# Patient Record
Sex: Female | Born: 1945
Health system: Southern US, Community
[De-identification: ages and names within clinical notes are randomized; demographics above are authoritative.]

## PROBLEM LIST (undated history)

## (undated) DIAGNOSIS — E785 Hyperlipidemia, unspecified: Secondary | ICD-10-CM

## (undated) DIAGNOSIS — IMO0001 Reserved for inherently not codable concepts without codable children: Secondary | ICD-10-CM

## (undated) DIAGNOSIS — F32A Depression, unspecified: Secondary | ICD-10-CM

## (undated) DIAGNOSIS — J449 Chronic obstructive pulmonary disease, unspecified: Secondary | ICD-10-CM

## (undated) DIAGNOSIS — I10 Essential (primary) hypertension: Secondary | ICD-10-CM

## (undated) DIAGNOSIS — F329 Major depressive disorder, single episode, unspecified: Secondary | ICD-10-CM

## (undated) DIAGNOSIS — M1990 Unspecified osteoarthritis, unspecified site: Secondary | ICD-10-CM

## (undated) DIAGNOSIS — C801 Malignant (primary) neoplasm, unspecified: Secondary | ICD-10-CM

## (undated) DIAGNOSIS — J45909 Unspecified asthma, uncomplicated: Secondary | ICD-10-CM

## (undated) HISTORY — PX: ABDOMINAL HYSTERECTOMY: SHX81

## (undated) HISTORY — DX: Hyperlipidemia, unspecified: E78.5

## (undated) HISTORY — PX: BREAST SURGERY: SHX581

---

## 2001-08-27 DIAGNOSIS — C801 Malignant (primary) neoplasm, unspecified: Secondary | ICD-10-CM

## 2001-08-27 HISTORY — DX: Malignant (primary) neoplasm, unspecified: C80.1

## 2005-10-03 ENCOUNTER — Ambulatory Visit: Payer: Self-pay | Admitting: Physician Assistant

## 2006-07-31 ENCOUNTER — Ambulatory Visit: Payer: Self-pay | Admitting: Gynecologic Oncology

## 2010-10-02 ENCOUNTER — Ambulatory Visit: Payer: Self-pay | Admitting: Gastroenterology

## 2015-09-01 DIAGNOSIS — E559 Vitamin D deficiency, unspecified: Secondary | ICD-10-CM | POA: Diagnosis not present

## 2015-09-01 DIAGNOSIS — Z0001 Encounter for general adult medical examination with abnormal findings: Secondary | ICD-10-CM | POA: Diagnosis not present

## 2015-09-01 DIAGNOSIS — I10 Essential (primary) hypertension: Secondary | ICD-10-CM | POA: Diagnosis not present

## 2015-09-01 DIAGNOSIS — E782 Mixed hyperlipidemia: Secondary | ICD-10-CM | POA: Diagnosis not present

## 2015-09-13 DIAGNOSIS — Z1231 Encounter for screening mammogram for malignant neoplasm of breast: Secondary | ICD-10-CM | POA: Diagnosis not present

## 2015-09-28 DIAGNOSIS — H2512 Age-related nuclear cataract, left eye: Secondary | ICD-10-CM | POA: Diagnosis not present

## 2015-09-30 DIAGNOSIS — H2512 Age-related nuclear cataract, left eye: Secondary | ICD-10-CM | POA: Diagnosis not present

## 2015-10-03 ENCOUNTER — Encounter: Payer: Self-pay | Admitting: *Deleted

## 2015-10-03 MED ORDER — LIDOCAINE HCL (PF) 1 % IJ SOLN
INTRAMUSCULAR | Status: AC
  Filled 2015-10-03: qty 2

## 2015-10-04 ENCOUNTER — Ambulatory Visit: Payer: PPO | Admitting: Anesthesiology

## 2015-10-04 ENCOUNTER — Encounter
Admission: RE | Disposition: A | Discharge status: Home / Self Care | Payer: Self-pay | Source: Ambulatory Visit | Attending: Ophthalmology

## 2015-10-04 ENCOUNTER — Encounter: Payer: Self-pay | Admitting: *Deleted

## 2015-10-04 ENCOUNTER — Ambulatory Visit
Admission: RE | Admit: 2015-10-04 | Discharge: 2015-10-04 | Disposition: A | Discharge status: Home / Self Care | Payer: PPO | Source: Ambulatory Visit | Attending: Ophthalmology | Admitting: Ophthalmology

## 2015-10-04 DIAGNOSIS — Z90710 Acquired absence of both cervix and uterus: Secondary | ICD-10-CM | POA: Diagnosis not present

## 2015-10-04 DIAGNOSIS — H2512 Age-related nuclear cataract, left eye: Secondary | ICD-10-CM | POA: Insufficient documentation

## 2015-10-04 DIAGNOSIS — J449 Chronic obstructive pulmonary disease, unspecified: Secondary | ICD-10-CM | POA: Insufficient documentation

## 2015-10-04 DIAGNOSIS — E7800 Pure hypercholesterolemia, unspecified: Secondary | ICD-10-CM | POA: Diagnosis not present

## 2015-10-04 DIAGNOSIS — Z8542 Personal history of malignant neoplasm of other parts of uterus: Secondary | ICD-10-CM | POA: Diagnosis not present

## 2015-10-04 DIAGNOSIS — F17200 Nicotine dependence, unspecified, uncomplicated: Secondary | ICD-10-CM | POA: Insufficient documentation

## 2015-10-04 DIAGNOSIS — I10 Essential (primary) hypertension: Secondary | ICD-10-CM | POA: Insufficient documentation

## 2015-10-04 DIAGNOSIS — F329 Major depressive disorder, single episode, unspecified: Secondary | ICD-10-CM | POA: Insufficient documentation

## 2015-10-04 DIAGNOSIS — R0602 Shortness of breath: Secondary | ICD-10-CM | POA: Insufficient documentation

## 2015-10-04 DIAGNOSIS — Z886 Allergy status to analgesic agent status: Secondary | ICD-10-CM | POA: Insufficient documentation

## 2015-10-04 HISTORY — DX: Malignant (primary) neoplasm, unspecified: C80.1

## 2015-10-04 HISTORY — DX: Essential (primary) hypertension: I10

## 2015-10-04 HISTORY — DX: Depression, unspecified: F32.A

## 2015-10-04 HISTORY — DX: Major depressive disorder, single episode, unspecified: F32.9

## 2015-10-04 HISTORY — PX: CATARACT EXTRACTION W/PHACO: SHX586

## 2015-10-04 HISTORY — DX: Chronic obstructive pulmonary disease, unspecified: J44.9

## 2015-10-04 HISTORY — DX: Reserved for inherently not codable concepts without codable children: IMO0001

## 2015-10-04 HISTORY — DX: Unspecified asthma, uncomplicated: J45.909

## 2015-10-04 SURGERY — PHACOEMULSIFICATION, CATARACT, WITH IOL INSERTION
Anesthesia: Monitor Anesthesia Care | Site: Eye | Laterality: Left | Wound class: Clean

## 2015-10-04 MED ORDER — MOXIFLOXACIN HCL 0.5 % OP SOLN
OPHTHALMIC | Status: DC | PRN
  Administered 2015-10-04: 4 [drp] via OPHTHALMIC

## 2015-10-04 MED ORDER — POVIDONE-IODINE 5 % OP SOLN
1.0000 "application " | OPHTHALMIC | Status: AC | PRN
  Administered 2015-10-04: 1 via OPHTHALMIC

## 2015-10-04 MED ORDER — ARMC OPHTHALMIC DILATING GEL
1.0000 "application " | OPHTHALMIC | Status: DC | PRN
  Administered 2015-10-04: 1 via OPHTHALMIC

## 2015-10-04 MED ORDER — ARMC OPHTHALMIC DILATING GEL
OPHTHALMIC | Status: AC
  Administered 2015-10-04: 1 via OPHTHALMIC
  Filled 2015-10-04: qty 0.25

## 2015-10-04 MED ORDER — CEFUROXIME OPHTHALMIC INJECTION 1 MG/0.1 ML
INJECTION | OPHTHALMIC | Status: DC | PRN
  Administered 2015-10-04: 1 mg via INTRACAMERAL

## 2015-10-04 MED ORDER — EPINEPHRINE HCL 1 MG/ML IJ SOLN
INTRAOCULAR | Status: DC | PRN
  Administered 2015-10-04: 250 mL via OPHTHALMIC

## 2015-10-04 MED ORDER — NA CHONDROIT SULF-NA HYALURON 40-17 MG/ML IO SOLN
INTRAOCULAR | Status: AC
  Filled 2015-10-04: qty 1

## 2015-10-04 MED ORDER — EPINEPHRINE HCL 1 MG/ML IJ SOLN
INTRAMUSCULAR | Status: AC
  Filled 2015-10-04: qty 1

## 2015-10-04 MED ORDER — TETRACAINE HCL 0.5 % OP SOLN
OPHTHALMIC | Status: AC
  Administered 2015-10-04: 1 [drp] via OPHTHALMIC
  Filled 2015-10-04: qty 2

## 2015-10-04 MED ORDER — MOXIFLOXACIN HCL 0.5 % OP SOLN
OPHTHALMIC | Status: AC
  Filled 2015-10-04: qty 3

## 2015-10-04 MED ORDER — CEFUROXIME OPHTHALMIC INJECTION 1 MG/0.1 ML
INJECTION | OPHTHALMIC | Status: AC
  Filled 2015-10-04: qty 0.1

## 2015-10-04 MED ORDER — CARBACHOL 0.01 % IO SOLN
INTRAOCULAR | Status: DC | PRN
  Administered 2015-10-04: 0.5 mL via INTRAOCULAR

## 2015-10-04 MED ORDER — ALFENTANIL 500 MCG/ML IJ INJ
INJECTION | INTRAMUSCULAR | Status: DC | PRN
  Administered 2015-10-04: 500 ug via INTRAVENOUS

## 2015-10-04 MED ORDER — SODIUM CHLORIDE 0.9 % IV SOLN
INTRAVENOUS | Status: DC
  Administered 2015-10-04 (×2): via INTRAVENOUS

## 2015-10-04 MED ORDER — POVIDONE-IODINE 5 % OP SOLN
OPHTHALMIC | Status: AC
  Administered 2015-10-04: 1 via OPHTHALMIC
  Filled 2015-10-04: qty 30

## 2015-10-04 MED ORDER — NA CHONDROIT SULF-NA HYALURON 40-17 MG/ML IO SOLN
INTRAOCULAR | Status: DC | PRN
  Administered 2015-10-04: 1 mL via INTRAOCULAR

## 2015-10-04 MED ORDER — MOXIFLOXACIN HCL 0.5 % OP SOLN: 1.0000 [drp] | OPHTHALMIC | Status: DC | PRN

## 2015-10-04 MED ORDER — TETRACAINE HCL 0.5 % OP SOLN
1.0000 [drp] | OPHTHALMIC | Status: AC | PRN
  Administered 2015-10-04: 1 [drp] via OPHTHALMIC

## 2015-10-04 SURGICAL SUPPLY — 22 items
CANNULA ANT/CHMB 27GA (MISCELLANEOUS) ×3 IMPLANT
CUP MEDICINE 2OZ PLAST GRAD ST (MISCELLANEOUS) ×3 IMPLANT
GLOVE BIO SURGEON STRL SZ8 (GLOVE) ×3 IMPLANT
GLOVE BIOGEL M 6.5 STRL (GLOVE) ×3 IMPLANT
GLOVE SURG LX 8.0 MICRO (GLOVE) ×2
GLOVE SURG LX STRL 8.0 MICRO (GLOVE) ×1 IMPLANT
GOWN STRL REUS W/ TWL LRG LVL3 (GOWN DISPOSABLE) ×2 IMPLANT
GOWN STRL REUS W/TWL LRG LVL3 (GOWN DISPOSABLE) ×4
LENS IOL TECNIS 23.0 (Intraocular Lens) ×3 IMPLANT
LENS IOL TECNIS MONO 1P 23.0 (Intraocular Lens) ×1 IMPLANT
PACK CATARACT (MISCELLANEOUS) ×3 IMPLANT
PACK CATARACT BRASINGTON LX (MISCELLANEOUS) ×3 IMPLANT
PACK EYE AFTER SURG (MISCELLANEOUS) ×3 IMPLANT
SOL BSS BAG (MISCELLANEOUS) ×3
SOL PREP PVP 2OZ (MISCELLANEOUS) ×3
SOLUTION BSS BAG (MISCELLANEOUS) ×1 IMPLANT
SOLUTION PREP PVP 2OZ (MISCELLANEOUS) ×1 IMPLANT
SYR 3ML LL SCALE MARK (SYRINGE) ×3 IMPLANT
SYR 5ML LL (SYRINGE) ×3 IMPLANT
SYR TB 1ML 27GX1/2 LL (SYRINGE) ×3 IMPLANT
WATER STERILE IRR 1000ML POUR (IV SOLUTION) ×3 IMPLANT
WIPE NON LINTING 3.25X3.25 (MISCELLANEOUS) ×3 IMPLANT

## 2015-10-04 NOTE — Anesthesia Preprocedure Evaluation (Signed)
Anesthesia Evaluation  Patient identified by MRN, date of birth, ID band Patient awake    Reviewed: Allergy & Precautions, H&P , NPO status , Patient's Chart, lab work & pertinent test results, reviewed documented beta blocker date and time   History of Anesthesia Complications Negative for: history of anesthetic complications  Airway Mallampati: I  TM Distance: >3 FB Neck ROM: full    Dental no notable dental hx. (+) Caps   Pulmonary neg shortness of breath, asthma , neg sleep apnea, COPD,  COPD inhaler, neg recent URI, Current Smoker,    Pulmonary exam normal breath sounds clear to auscultation       Cardiovascular Exercise Tolerance: Good hypertension, (-) angina(-) CAD, (-) Past MI, (-) Cardiac Stents and (-) CABG Normal cardiovascular exam(-) dysrhythmias (-) Valvular Problems/Murmurs Rhythm:regular Rate:Normal     Neuro/Psych PSYCHIATRIC DISORDERS (Depression) negative neurological ROS     GI/Hepatic negative GI ROS, Neg liver ROS,   Endo/Other  negative endocrine ROS  Renal/GU negative Renal ROS  negative genitourinary   Musculoskeletal   Abdominal   Peds  Hematology negative hematology ROS (+)   Anesthesia Other Findings Past Medical History:   Asthma                                                       COPD (chronic obstructive pulmonary disease) (*              Shortness of breath dyspnea                                    Comment:DOE   Hypertension                                                 Depression                                                   Cancer (HCC)                                                   Comment:UTERINE   Reproductive/Obstetrics negative OB ROS                             Anesthesia Physical Anesthesia Plan  ASA: III  Anesthesia Plan: MAC   Post-op Pain Management:    Induction:   Airway Management Planned:   Additional Equipment:    Intra-op Plan:   Post-operative Plan:   Informed Consent: I have reviewed the patients History and Physical, chart, labs and discussed the procedure including the risks, benefits and alternatives for the proposed anesthesia with the patient or authorized representative who has indicated his/her understanding and acceptance.   Dental Advisory Given  Plan Discussed with: Anesthesiologist, CRNA and Surgeon  Anesthesia Plan Comments:  Anesthesia Quick Evaluation  

## 2015-10-04 NOTE — Op Note (Signed)
PREOPERATIVE DIAGNOSIS:  Nuclear sclerotic cataract of the left eye.   POSTOPERATIVE DIAGNOSIS:  left nuclear sclerotic CATARACT   OPERATIVE PROCEDURE:  Procedure(s): CATARACT EXTRACTION PHACO AND INTRAOCULAR LENS PLACEMENT (IOC)   SURGEON:  Birder Robson, MD.   ANESTHESIA:   Anesthesiologist: Martha Clan, MD CRNA: Courtney Paris, CRNA  1.      Managed anesthesia care. 2.      Topical tetracaine drops followed by 2% Xylocaine jelly applied in the preoperative holding area.   COMPLICATIONS:  None.   TECHNIQUE:   Stop and chop   DESCRIPTION OF PROCEDURE:  The patient was examined and consented in the preoperative holding area where the aforementioned topical anesthesia was applied to the left eye and then brought back to the Operating Room where the left eye was prepped and draped in the usual sterile ophthalmic fashion and a lid speculum was placed. A paracentesis was created with the side port blade and the anterior chamber was filled with viscoelastic. A near clear corneal incision was performed with the steel keratome. A continuous curvilinear capsulorrhexis was performed with a cystotome followed by the capsulorrhexis forceps. Hydrodissection and hydrodelineation were carried out with BSS on a blunt cannula. The lens was removed in a stop and chop  technique and the remaining cortical material was removed with the irrigation-aspiration handpiece. The capsular bag was inflated with viscoelastic and the Technis ZCB00 lens was placed in the capsular bag without complication. The remaining viscoelastic was removed from the eye with the irrigation-aspiration handpiece. The wounds were hydrated. The anterior chamber was flushed with Miostat and the eye was inflated to physiologic pressure. 0.1 mL of cefuroxime concentration 10 mg/mL was placed in the anterior chamber. The wounds were found to be water tight. The eye was dressed with Vigamox. The patient was given protective glasses to wear  throughout the day and a shield with which to sleep tonight. The patient was also given drops with which to begin a drop regimen today and will follow-up with me in one day.  Implant Name Type Inv. Item Serial No. Manufacturer Lot No. LRB No. Used  LENS IMPL INTRAOC ZCB00 23.0 - [id number] Intraocular Lens LENS IMPL INTRAOC ZCB00 23.0 [id number]   Left 1   Procedure(s) with comments: CATARACT EXTRACTION PHACO AND INTRAOCULAR LENS PLACEMENT (IOC) (Left) - [place]: 00:46.8 AP%: 21.7 CDE: 10.19 Lot # [id number]  Electronically signed: [place] 10/04/2015 11:44 AM

## 2015-10-04 NOTE — H&P (Signed)
All labs reviewed. Abnormal studies sent to patients PCP when indicated.  Previous H&P reviewed, patient examined, there are NO CHANGES.  Wendy Garza LOUIS2/7/201711:17 AM

## 2015-10-04 NOTE — Anesthesia Postprocedure Evaluation (Signed)
Anesthesia Post Note  Patient: Wendy Garza  Procedure(s) Performed: Procedure(s) (LRB): CATARACT EXTRACTION PHACO AND INTRAOCULAR LENS PLACEMENT (IOC) (Left)  Patient location during evaluation: PACU Anesthesia Type: General Level of consciousness: awake and alert Pain management: pain level controlled Vital Signs Assessment: post-procedure vital signs reviewed and stable Respiratory status: spontaneous breathing, nonlabored ventilation, respiratory function stable and patient connected to nasal cannula oxygen Cardiovascular status: blood pressure returned to baseline and stable Postop Assessment: no signs of nausea or vomiting Anesthetic complications: no    Last Vitals:  Filed Vitals:   10/04/15 1147 10/04/15 1203  BP: 125/70 129/86  Pulse: 69 77  Temp: 36.8 C   Resp: 18 16    Last Pain: There were no vitals filed for this visit.               Martha Clan

## 2015-10-04 NOTE — Transfer of Care (Signed)
Immediate Anesthesia Transfer of Care Note  Patient: Wendy Garza  Procedure(s) Performed: Procedure(s) with comments: CATARACT EXTRACTION PHACO AND INTRAOCULAR LENS PLACEMENT (IOC) (Left) - [place]: 00:46.8 AP%: 21.7 CDE: 10.19 Lot # [id number]  Patient Location: PACU and Short Stay  Anesthesia Type:MAC  Level of Consciousness: awake, oriented and patient cooperative  Airway & Oxygen Therapy: Patient Spontanous Breathing  Post-op Assessment: Report given to RN and Post -op Vital signs reviewed and stable  Post vital signs: Reviewed and stable  Last Vitals:  Filed Vitals:   10/04/15 1009  BP: 144/84  Pulse: 78  Temp: 36.6 C  Resp: 16    Complications: No apparent anesthesia complications

## 2015-10-04 NOTE — Discharge Instructions (Signed)
Eye Surgery Discharge Instructions  Expect mild scratchy sensation or mild soreness. DO NOT RUB YOUR EYE!  The day of surgery:  Minimal physical activity, but bed rest is not required  No reading, computer work, or close hand work  No bending, lifting, or straining.  May watch TV  For 24 hours:  No driving, legal decisions, or alcoholic beverages  Safety precautions  Eat anything you prefer: It is better to start with liquids, then soup then solid foods.  _____ Eye patch should be worn until postoperative exam tomorrow.  ____ Solar shield eyeglasses should be worn for comfort in the sunlight/patch while sleeping  Resume all regular medications including aspirin or Coumadin if these were discontinued prior to surgery. You may shower, bathe, shave, or wash your hair. Tylenol may be taken for mild discomfort.  Call your doctor if you experience significant pain, nausea, or vomiting, fever > 101 or other signs of infection. [phone] or [phone] Specific instructions:  Follow-up Information    Follow up with Tim Lair, MD On 10/05/2015.   Specialty:  Ophthalmology   Why:  8:20   Contact information:   [address] [place] [address] [phone]

## 2015-10-19 DIAGNOSIS — R319 Hematuria, unspecified: Secondary | ICD-10-CM | POA: Diagnosis not present

## 2015-10-20 DIAGNOSIS — H2511 Age-related nuclear cataract, right eye: Secondary | ICD-10-CM | POA: Diagnosis not present

## 2015-10-24 ENCOUNTER — Encounter: Payer: Self-pay | Admitting: *Deleted

## 2015-10-25 ENCOUNTER — Ambulatory Visit: Payer: PPO | Admitting: Anesthesiology

## 2015-10-25 ENCOUNTER — Encounter: Payer: Self-pay | Admitting: *Deleted

## 2015-10-25 ENCOUNTER — Ambulatory Visit
Admission: RE | Admit: 2015-10-25 | Discharge: 2015-10-25 | Disposition: A | Discharge status: Home / Self Care | Payer: PPO | Source: Ambulatory Visit | Attending: Ophthalmology | Admitting: Ophthalmology

## 2015-10-25 ENCOUNTER — Encounter
Admission: RE | Disposition: A | Discharge status: Home / Self Care | Payer: Self-pay | Source: Ambulatory Visit | Attending: Ophthalmology

## 2015-10-25 DIAGNOSIS — J449 Chronic obstructive pulmonary disease, unspecified: Secondary | ICD-10-CM | POA: Diagnosis not present

## 2015-10-25 DIAGNOSIS — H2511 Age-related nuclear cataract, right eye: Secondary | ICD-10-CM | POA: Insufficient documentation

## 2015-10-25 DIAGNOSIS — F17200 Nicotine dependence, unspecified, uncomplicated: Secondary | ICD-10-CM | POA: Diagnosis not present

## 2015-10-25 DIAGNOSIS — Z90710 Acquired absence of both cervix and uterus: Secondary | ICD-10-CM | POA: Diagnosis not present

## 2015-10-25 DIAGNOSIS — Z9842 Cataract extraction status, left eye: Secondary | ICD-10-CM | POA: Insufficient documentation

## 2015-10-25 DIAGNOSIS — F329 Major depressive disorder, single episode, unspecified: Secondary | ICD-10-CM | POA: Insufficient documentation

## 2015-10-25 DIAGNOSIS — Z8542 Personal history of malignant neoplasm of other parts of uterus: Secondary | ICD-10-CM | POA: Insufficient documentation

## 2015-10-25 DIAGNOSIS — E7800 Pure hypercholesterolemia, unspecified: Secondary | ICD-10-CM | POA: Insufficient documentation

## 2015-10-25 DIAGNOSIS — R0602 Shortness of breath: Secondary | ICD-10-CM | POA: Insufficient documentation

## 2015-10-25 DIAGNOSIS — I10 Essential (primary) hypertension: Secondary | ICD-10-CM | POA: Insufficient documentation

## 2015-10-25 DIAGNOSIS — Z886 Allergy status to analgesic agent status: Secondary | ICD-10-CM | POA: Insufficient documentation

## 2015-10-25 HISTORY — PX: CATARACT EXTRACTION W/PHACO: SHX586

## 2015-10-25 SURGERY — PHACOEMULSIFICATION, CATARACT, WITH IOL INSERTION
Anesthesia: Monitor Anesthesia Care | Site: Eye | Laterality: Right | Wound class: Clean

## 2015-10-25 MED ORDER — CARBACHOL 0.01 % IO SOLN
INTRAOCULAR | Status: DC | PRN
  Administered 2015-10-25: .5 mL via INTRAOCULAR

## 2015-10-25 MED ORDER — POVIDONE-IODINE 5 % OP SOLN
1.0000 "application " | OPHTHALMIC | Status: AC | PRN
  Administered 2015-10-25: 1 via OPHTHALMIC

## 2015-10-25 MED ORDER — MOXIFLOXACIN HCL 0.5 % OP SOLN: 1.0000 [drp] | OPHTHALMIC | Status: DC | PRN

## 2015-10-25 MED ORDER — CEFUROXIME OPHTHALMIC INJECTION 1 MG/0.1 ML
INJECTION | OPHTHALMIC | Status: AC
  Filled 2015-10-25: qty 0.1

## 2015-10-25 MED ORDER — TETRACAINE HCL 0.5 % OP SOLN
OPHTHALMIC | Status: AC
  Filled 2015-10-25: qty 2

## 2015-10-25 MED ORDER — EPINEPHRINE HCL 1 MG/ML IJ SOLN
INTRAMUSCULAR | Status: AC
  Filled 2015-10-25: qty 1

## 2015-10-25 MED ORDER — ARMC OPHTHALMIC DILATING GEL
OPHTHALMIC | Status: AC
  Filled 2015-10-25: qty 0.25

## 2015-10-25 MED ORDER — FENTANYL CITRATE (PF) 100 MCG/2ML IJ SOLN
INTRAMUSCULAR | Status: DC | PRN
  Administered 2015-10-25: 50 ug via INTRAVENOUS

## 2015-10-25 MED ORDER — TETRACAINE HCL 0.5 % OP SOLN
1.0000 [drp] | OPHTHALMIC | Status: AC | PRN
  Administered 2015-10-25: 1 [drp] via OPHTHALMIC

## 2015-10-25 MED ORDER — MOXIFLOXACIN HCL 0.5 % OP SOLN
OPHTHALMIC | Status: AC
  Filled 2015-10-25: qty 3

## 2015-10-25 MED ORDER — MIDAZOLAM HCL 2 MG/2ML IJ SOLN
INTRAMUSCULAR | Status: DC | PRN
  Administered 2015-10-25: 1 mg via INTRAVENOUS

## 2015-10-25 MED ORDER — ARMC OPHTHALMIC DILATING GEL
1.0000 "application " | OPHTHALMIC | Status: DC | PRN
  Administered 2015-10-25: 1 via OPHTHALMIC

## 2015-10-25 MED ORDER — MOXIFLOXACIN HCL 0.5 % OP SOLN
OPHTHALMIC | Status: DC | PRN
  Administered 2015-10-25: 1 [drp] via OPHTHALMIC

## 2015-10-25 MED ORDER — CEFUROXIME OPHTHALMIC INJECTION 1 MG/0.1 ML
INJECTION | OPHTHALMIC | Status: DC | PRN
  Administered 2015-10-25: .1 mL via INTRACAMERAL

## 2015-10-25 MED ORDER — SODIUM CHLORIDE 0.9 % IV SOLN
INTRAVENOUS | Status: DC
  Administered 2015-10-25: 11:00:00 via INTRAVENOUS

## 2015-10-25 MED ORDER — EPINEPHRINE HCL 1 MG/ML IJ SOLN
INTRAMUSCULAR | Status: DC | PRN
  Administered 2015-10-25: 1 mL via OPHTHALMIC

## 2015-10-25 MED ORDER — NA CHONDROIT SULF-NA HYALURON 40-17 MG/ML IO SOLN
INTRAOCULAR | Status: DC | PRN
  Administered 2015-10-25: 1 mL via INTRAOCULAR

## 2015-10-25 MED ORDER — POVIDONE-IODINE 5 % OP SOLN
OPHTHALMIC | Status: AC
  Filled 2015-10-25: qty 30

## 2015-10-25 SURGICAL SUPPLY — 23 items
CANNULA ANT/CHMB 27GA (MISCELLANEOUS) ×3 IMPLANT
CUP MEDICINE 2OZ PLAST GRAD ST (MISCELLANEOUS) ×3 IMPLANT
GLOVE BIO SURGEON STRL SZ8 (GLOVE) ×3 IMPLANT
GLOVE BIOGEL M 6.5 STRL (GLOVE) ×3 IMPLANT
GLOVE SURG LX 8.0 MICRO (GLOVE) ×2
GLOVE SURG LX STRL 8.0 MICRO (GLOVE) ×1 IMPLANT
GOWN STRL REUS W/ TWL LRG LVL3 (GOWN DISPOSABLE) ×2 IMPLANT
GOWN STRL REUS W/TWL LRG LVL3 (GOWN DISPOSABLE) ×4
LENS IOL TECNIS 21 (Intraocular Lens) ×1 IMPLANT
LENS IOL TECNIS 21.0 (Intraocular Lens) ×2 IMPLANT
LENS IOL TECNIS MONO 1P 21.0 (Intraocular Lens) ×1 IMPLANT
PACK CATARACT (MISCELLANEOUS) ×3 IMPLANT
PACK CATARACT BRASINGTON LX (MISCELLANEOUS) ×3 IMPLANT
PACK EYE AFTER SURG (MISCELLANEOUS) ×3 IMPLANT
SOL BSS BAG (MISCELLANEOUS) ×3
SOL PREP PVP 2OZ (MISCELLANEOUS) ×3
SOLUTION BSS BAG (MISCELLANEOUS) ×1 IMPLANT
SOLUTION PREP PVP 2OZ (MISCELLANEOUS) ×1 IMPLANT
SYR 3ML LL SCALE MARK (SYRINGE) ×3 IMPLANT
SYR 5ML LL (SYRINGE) ×3 IMPLANT
SYR TB 1ML 27GX1/2 LL (SYRINGE) ×3 IMPLANT
WATER STERILE IRR 1000ML POUR (IV SOLUTION) ×3 IMPLANT
WIPE NON LINTING 3.25X3.25 (MISCELLANEOUS) ×3 IMPLANT

## 2015-10-25 NOTE — Anesthesia Postprocedure Evaluation (Signed)
Anesthesia Post Note  Patient: Wendy Garza  Procedure(s) Performed: Procedure(s) (LRB): CATARACT EXTRACTION PHACO AND INTRAOCULAR LENS PLACEMENT (IOC) (Right)  Patient location during evaluation: PACU Anesthesia Type: MAC Level of consciousness: awake and alert Pain management: pain level controlled Vital Signs Assessment: post-procedure vital signs reviewed and stable Respiratory status: spontaneous breathing, nonlabored ventilation, respiratory function stable and patient connected to nasal cannula oxygen Cardiovascular status: stable and blood pressure returned to baseline Anesthetic complications: no    Last Vitals:  Filed Vitals:   10/25/15 1036 10/25/15 1151  BP: 131/104 127/95  Pulse: 78 66  Temp: 37.1 C   Resp: 16 16    Last Pain: There were no vitals filed for this visit.               [employer]

## 2015-10-25 NOTE — Op Note (Signed)
PREOPERATIVE DIAGNOSIS:  Nuclear sclerotic cataract of the right eye.   POSTOPERATIVE DIAGNOSIS: nuclear sclerotic cataract right eye   OPERATIVE PROCEDURE:  Procedure(s): CATARACT EXTRACTION PHACO AND INTRAOCULAR LENS PLACEMENT (IOC)   SURGEON:  Birder Robson, MD.   ANESTHESIA:  Anesthesiologist: Molli Barrows, MD CRNA: Lance Muss, CRNA  1.      Managed anesthesia care. 2.      Topical tetracaine drops followed by 2% Xylocaine jelly applied in the preoperative holding area.   COMPLICATIONS:  None.   TECHNIQUE:   Stop and chop   DESCRIPTION OF PROCEDURE:  The patient was examined and consented in the preoperative holding area where the aforementioned topical anesthesia was applied to the right eye and then brought back to the Operating Room where the right eye was prepped and draped in the usual sterile ophthalmic fashion and a lid speculum was placed. A paracentesis was created with the side port blade and the anterior chamber was filled with viscoelastic. A near clear corneal incision was performed with the steel keratome. A continuous curvilinear capsulorrhexis was performed with a cystotome followed by the capsulorrhexis forceps. Hydrodissection and hydrodelineation were carried out with BSS on a blunt cannula. The lens was removed in a stop and chop  technique and the remaining cortical material was removed with the irrigation-aspiration handpiece. The capsular bag was inflated with viscoelastic and the Technis ZCB00  lens was placed in the capsular bag without complication. The remaining viscoelastic was removed from the eye with the irrigation-aspiration handpiece. The wounds were hydrated. The anterior chamber was flushed with Miostat and the eye was inflated to physiologic pressure. 0.1 mL of cefuroxime concentration 10 mg/mL was placed in the anterior chamber. The wounds were found to be water tight. The eye was dressed with Vigamox. The patient was given protective glasses to wear  throughout the day and a shield with which to sleep tonight. The patient was also given drops with which to begin a drop regimen today and will follow-up with me in one day.  Implant Name Type Inv. Item Serial No. Manufacturer Lot No. LRB No. Used  LENS IOL TECNIS 21.0 - [id number] Intraocular Lens LENS IOL TECNIS 21.0 [id number] Right 1   Procedure(s) with comments: CATARACT EXTRACTION PHACO AND INTRAOCULAR LENS PLACEMENT (IOC) (Right) - [place] 00:48 AP% 25.2 CDE 12.16 fluid pack lot # [id number]  Electronically signed: [place] 10/25/2015 11:53 AM

## 2015-10-25 NOTE — Anesthesia Postprocedure Evaluation (Signed)
Anesthesia Post Note  Patient: Wendy Garza  Procedure(s) Performed: Procedure(s) (LRB): CATARACT EXTRACTION PHACO AND INTRAOCULAR LENS PLACEMENT (IOC) (Right)  Patient location during evaluation: PACU Anesthesia Type: MAC Level of consciousness: awake and alert, awake and oriented Pain management: pain level controlled Vital Signs Assessment: post-procedure vital signs reviewed and stable Respiratory status: spontaneous breathing Cardiovascular status: stable Anesthetic complications: no    Last Vitals:  Filed Vitals:   10/25/15 1036 10/25/15 1151  BP: 131/104   Pulse: 78   Temp: 37.1 C   Resp: 16 16    Last Pain: There were no vitals filed for this visit.               [employer]

## 2015-10-25 NOTE — Discharge Instructions (Signed)
AMBULATORY SURGERY  DISCHARGE INSTRUCTIONS   1) The drugs that you were given will stay in your system until tomorrow so for the next 24 hours you should not:  A) Drive an automobile B) Make any legal decisions C) Drink any alcoholic beverage   2) You may resume regular meals tomorrow.  Today it is better to start with liquids and gradually work up to solid foods.  You may eat anything you prefer, but it is better to start with liquids, then soup and crackers, and gradually work up to solid foods.   3) Please notify your doctor immediately if you have any unusual bleeding, trouble breathing, redness and pain at the surgery site, drainage, fever, or pain not relieved by medication.    4) Additional Instructions:   Eye Surgery Discharge Instructions  Expect mild scratchy sensation or mild soreness. DO NOT RUB YOUR EYE!  The day of surgery:  Minimal physical activity, but bed rest is not required  No reading, computer work, or close hand work  No bending, lifting, or straining.  May watch TV  For 24 hours:  No driving, legal decisions, or alcoholic beverages  Safety precautions  Eat anything you prefer: It is better to start with liquids, then soup then solid foods.  _____ Eye patch should be worn until postoperative exam tomorrow.  ____ Solar shield eyeglasses should be worn for comfort in the sunlight/patch while sleeping  Resume all regular medications including aspirin or Coumadin if these were discontinued prior to surgery. You may shower, bathe, shave, or wash your hair. Tylenol may be taken for mild discomfort.  Call your doctor if you experience significant pain, nausea, or vomiting, fever > 101 or other signs of infection. [phone] or [phone] Specific instructions:  Follow-up Information    Follow up with PORFILIO,WILLIAM LOUIS, MD In 1 day.   Specialty:  Ophthalmology   Why:  March 1 at 9:35am   Contact information:   [place] [address] [phone]          Please contact your physician with any problems or Same Day Surgery at [phone], Monday through Friday 6 am to 4 pm, or [place] at [facility] number at [phone].

## 2015-10-25 NOTE — H&P (Signed)
All labs reviewed. Abnormal studies sent to patients PCP when indicated.  Previous H&P reviewed, patient examined, there are NO CHANGES.  Wendy Ake LOUIS2/28/201711:28 AM

## 2015-10-25 NOTE — Anesthesia Preprocedure Evaluation (Signed)
Anesthesia Evaluation  Patient identified by MRN, date of birth, ID band Patient awake    Reviewed: Allergy & Precautions, H&P , NPO status , Patient's Chart, lab work & pertinent test results, reviewed documented beta blocker date and time   Airway Mallampati: II  TM Distance: >3 FB Neck ROM: full    Dental no notable dental hx. (+) Teeth Intact   Pulmonary neg pulmonary ROS, shortness of breath, asthma , COPD, Current Smoker,    Pulmonary exam normal breath sounds clear to auscultation       Cardiovascular Exercise Tolerance: Good hypertension, negative cardio ROS   Rhythm:regular Rate:Normal     Neuro/Psych PSYCHIATRIC DISORDERS negative neurological ROS  negative psych ROS   GI/Hepatic negative GI ROS, Neg liver ROS,   Endo/Other  negative endocrine ROSdiabetes  Renal/GU      Musculoskeletal   Abdominal   Peds  Hematology negative hematology ROS (+)   Anesthesia Other Findings   Reproductive/Obstetrics negative OB ROS                             Anesthesia Physical Anesthesia Plan  ASA: III  Anesthesia Plan: MAC   Post-op Pain Management:    Induction:   Airway Management Planned:   Additional Equipment:   Intra-op Plan:   Post-operative Plan:   Informed Consent: I have reviewed the patients History and Physical, chart, labs and discussed the procedure including the risks, benefits and alternatives for the proposed anesthesia with the patient or authorized representative who has indicated his/her understanding and acceptance.     Plan Discussed with: CRNA  Anesthesia Plan Comments:         Anesthesia Quick Evaluation

## 2015-10-25 NOTE — Transfer of Care (Signed)
Immediate Anesthesia Transfer of Care Note  Patient: Wendy Garza  Procedure(s) Performed: Procedure(s) with comments: CATARACT EXTRACTION PHACO AND INTRAOCULAR LENS PLACEMENT (IOC) (Right) - [place] 00:48 AP% 25.2 CDE 12.16 fluid pack lot # [id number]  Patient Location: PACU  Anesthesia Type:MAC  Level of Consciousness: awake, alert  and oriented  Airway & Oxygen Therapy: Patient Spontanous Breathing  Post-op Assessment: Report given to RN and Post -op Vital signs reviewed and stable  Post vital signs: Reviewed and stable  Last Vitals:  Filed Vitals:   10/25/15 1036 10/25/15 1151  BP: 131/104   Pulse: 78   Temp: 37.1 C   Resp: 16 16    Complications: No apparent anesthesia complications

## 2015-10-27 DIAGNOSIS — Z8542 Personal history of malignant neoplasm of other parts of uterus: Secondary | ICD-10-CM | POA: Diagnosis not present

## 2015-10-27 DIAGNOSIS — R319 Hematuria, unspecified: Secondary | ICD-10-CM | POA: Diagnosis not present

## 2015-11-10 ENCOUNTER — Ambulatory Visit (INDEPENDENT_AMBULATORY_CARE_PROVIDER_SITE_OTHER): Payer: PPO | Admitting: Urology

## 2015-11-10 ENCOUNTER — Encounter: Payer: Self-pay | Admitting: Urology

## 2015-11-10 VITALS — BP 135/84 | HR 86 | Ht 64.0 in | Wt 164.5 lb

## 2015-11-10 DIAGNOSIS — N3941 Urge incontinence: Secondary | ICD-10-CM | POA: Diagnosis not present

## 2015-11-10 DIAGNOSIS — N393 Stress incontinence (female) (male): Secondary | ICD-10-CM

## 2015-11-10 DIAGNOSIS — R3129 Other microscopic hematuria: Secondary | ICD-10-CM

## 2015-11-10 DIAGNOSIS — R319 Hematuria, unspecified: Secondary | ICD-10-CM | POA: Diagnosis not present

## 2015-11-10 LAB — URINALYSIS, COMPLETE
BILIRUBIN UA: NEGATIVE
Glucose, UA: NEGATIVE
KETONES UA: NEGATIVE
LEUKOCYTES UA: NEGATIVE
NITRITE UA: NEGATIVE
PROTEIN UA: NEGATIVE
SPEC GRAV UA: 1.025 (ref 1.005–1.030)
UUROB: 1 mg/dL (ref 0.2–1.0)
pH, UA: 6 (ref 5.0–7.5)

## 2015-11-10 LAB — MICROSCOPIC EXAMINATION
Bacteria, UA: NONE SEEN
WBC UA: NONE SEEN /HPF (ref 0–?)

## 2015-11-10 MED ORDER — RANITIDINE HCL 150 MG PO TABS: ORAL_TABLET | ORAL | Status: DC

## 2015-11-10 MED ORDER — DIPHENHYDRAMINE HCL 50 MG PO TABS: ORAL_TABLET | ORAL | Status: DC

## 2015-11-10 MED ORDER — PREDNISONE 50 MG PO TABS: ORAL_TABLET | ORAL | Status: DC

## 2015-11-10 NOTE — Progress Notes (Signed)
11/10/2015 8:43 PM   Wendy Garza 1946/04/07 [id number]  Referring provider: No referring provider defined for this encounter.  Chief Complaint  Patient presents with  . Hematuria    new pt    HPI: Patient is a 70 year old Caucasian female with persistent microscopic hematuria who is referred to [place] by her primary care physician, Dr. Humphrey Rolls, for further evaluation and management.    She was found to have 4-10 RBC's/hpf on 08/25/2015 at her PCP's office and 11-30 RBC's/hpf in our office today.  She does not endorse gross hematuria.  She is not experiencing dysuria, suprapubic pain or flank pain.  She does not have a history of recurrent urinary tract infections.  She does not have a personal history of nephrolithiasis or a family history of nephrolithiasis.  She does not have a personal history of GU malignancy or a family history of GU malignancies.  She is a current smoker.  Her baseline urinary symptoms consist of urgency and urge incontinence over the last 2-3 years.  She is wearing a urinary pad daily and can have episodes of urge incontinence that will leak through to her clothing.  She also states that she loses a scant amount of urine when she laughs, coughs or sneezes.  She also is experiencing nocturia 1.    She is not had any recent fevers, chills, nausea or vomiting.  PMH: Past Medical History  Diagnosis Date  . Asthma   . COPD (chronic obstructive pulmonary disease) ([place])   . Shortness of breath dyspnea     DOE  . Hypertension   . Depression   . Cancer (HCC)     UTERINE  . Hyperlipidemia     Surgical History: Past Surgical History  Procedure Laterality Date  . Breast surgery    . Abdominal hysterectomy    . Cataract extraction w/phaco Left 10/04/2015    Procedure: CATARACT EXTRACTION PHACO AND INTRAOCULAR LENS PLACEMENT (IOC);  Surgeon: Birder Robson, MD;  Location: [facility] ORS;  Service: Ophthalmology;  Laterality: Left;  [place]: 00:46.8 AP%: 21.7 CDE:  10.19 Lot # [id number]  . Cataract extraction w/phaco Right 10/25/2015    Procedure: CATARACT EXTRACTION PHACO AND INTRAOCULAR LENS PLACEMENT (IOC);  Surgeon: Birder Robson, MD;  Location: [facility] ORS;  Service: Ophthalmology;  Laterality: Right;  [place] 00:48 AP% 25.2 CDE 12.16 fluid pack lot # [id number]    Home Medications:    Medication List       This list is accurate as of: 11/10/15 11:59 PM.  Always use your most recent med list.               albuterol 108 (90 Base) MCG/ACT inhaler  Commonly known as:  PROVENTIL HFA;VENTOLIN HFA  Inhale into the lungs every 6 (six) hours as needed for wheezing or shortness of breath. Reported on 11/10/2015     budesonide-formoterol 80-4.5 MCG/ACT inhaler  Commonly known as:  SYMBICORT  Inhale into the lungs 2 (two) times daily.     diphenhydrAMINE 50 MG tablet  Commonly known as:  BENADRYL  Take one hour prior to the CT Urogram     fluticasone 50 MCG/ACT nasal spray  Commonly known as:  FLONASE  Place into both nostrils daily. Reported on 11/10/2015     metoprolol 50 MG tablet  Commonly known as:  LOPRESSOR  Take 25 mg by mouth 2 (two) times daily.     predniSONE 50 MG tablet  Commonly known as:  DELTASONE  Take the one tablet  13 hours, 7 hours and 1 hours prior to the CT Urogram     ranitidine 150 MG tablet  Commonly known as:  ZANTAC  Take one tablet 1 hour prior to CT Urogram     rosuvastatin 20 MG tablet  Commonly known as:  CRESTOR  Take 20 mg by mouth daily.     sertraline 50 MG tablet  Commonly known as:  ZOLOFT  Take 50 mg by mouth daily.     Vitamin D (Ergocalciferol) 50000 units Caps capsule  Commonly known as:  DRISDOL  TAKE ONE CAPSULE BY MOUTH ONCE WEEKLY FOR VITAMIN D DEFICIENCY        Allergies:  Allergies  Allergen Reactions  . Aspirin   . Nickel Rash    Family History: Family History  Problem Relation Age of Onset  . Kidney disease Mother   . Kidney cancer Neg Hx   . Prostate cancer Neg Hx      Social History:  reports that she has been smoking.  She does not have any smokeless tobacco history on file. She reports that she does not drink alcohol. Her drug history is not on file.  ROS: UROLOGY Frequent Urination?: No Hard to postpone urination?: Yes Burning/pain with urination?: No Get up at night to urinate?: Yes Leakage of urine?: No Urine stream starts and stops?: No Trouble starting stream?: No Do you have to strain to urinate?: No Blood in urine?: Yes Urinary tract infection?: No Sexually transmitted disease?: No Injury to kidneys or bladder?: No Painful intercourse?: No Weak stream?: No Currently pregnant?: No Vaginal bleeding?: No Last menstrual period?: n  Gastrointestinal Nausea?: No Vomiting?: No Indigestion/heartburn?: No Diarrhea?: No Constipation?: No  Constitutional Fever: No Night sweats?: No Weight loss?: No Fatigue?: No  Skin Skin rash/lesions?: No Itching?: No  Eyes Blurred vision?: No Double vision?: No  Ears/Nose/Throat Sore throat?: No Sinus problems?: No  Hematologic/Lymphatic Swollen glands?: No Easy bruising?: No  Cardiovascular Leg swelling?: No Chest pain?: No  Respiratory Cough?: Yes Shortness of breath?: Yes  Endocrine Excessive thirst?: No  Musculoskeletal Back pain?: No Joint pain?: No  Neurological Headaches?: No Dizziness?: No  Psychologic Depression?: No Anxiety?: No  Physical Exam: BP 135/84 mmHg  Pulse 86  Ht 5\' 4"  (1.626 m)  Wt 164 lb 8 oz (74.617 kg)  BMI 28.22 kg/m2  Constitutional: Well nourished. Alert and oriented, No acute distress. HEENT: [place] AT, moist mucus membranes. Trachea midline, no masses. Cardiovascular: No clubbing, cyanosis, or edema. Respiratory: Normal respiratory effort, no increased work of breathing. GI: Abdomen is soft, non tender, non distended, no abdominal masses.  Stool sample for occult testing is not indicated.   GU: No CVA tenderness.  No bladder  fullness or masses.   Skin: No rashes, bruises or suspicious lesions. Lymph: No cervical or inguinal adenopathy. Neurologic: Grossly intact, no focal deficits, moving all 4 extremities. Psychiatric: Normal mood and affect.  Laboratory Data: Urinalysis Results for orders placed or performed in visit on 11/10/15  CULTURE, URINE COMPREHENSIVE  Result Value Ref Range   Urine Culture, Comprehensive Final report    Result 1 Comment   Microscopic Examination  Result Value Ref Range   WBC, UA None seen 0 -  5 /hpf   RBC, UA 11-30 (A) 0 -  2 /hpf   Epithelial Cells (non renal) 0-10 0 - 10 /hpf   Bacteria, UA None seen None seen/Few  Urinalysis, Complete  Result Value Ref Range   Specific Gravity, UA 1.025 1.005 -  1.030   pH, UA 6.0 5.0 - 7.5   Color, UA Yellow Yellow   Appearance Ur Clear Clear   Leukocytes, UA Negative Negative   Protein, UA Negative Negative/Trace   Glucose, UA Negative Negative   Ketones, UA Negative Negative   RBC, UA 3+ (A) Negative   Bilirubin, UA Negative Negative   Urobilinogen, Ur 1.0 0.2 - 1.0 mg/dL   Nitrite, UA Negative Negative   Microscopic Examination See below:     Assessment & Plan:    1. Microscopic hematuria:   Explained to patient the causes of blood in the urine are as follows: stones, UTI's, damage to the urinary tract and/or cancer.  It is explained to the patient that they will be scheduled for a CT Urogram with contrast material and that in rare instances, an allergic reaction can be serious and even life threatening with the injection of contrast material.   The patient denies any allergies to contrast or iodine.  She is allergic to lobster/crab, but she can eat oysters/clams/shrimp without issue.  She is prescribed an allergy prep. She is not taking metformin.  I have explained to the patient that they will  be scheduled for a cystoscopy in our office to evaluate their bladder.  The cystoscopy consists of passing a tube with a lens up through  their urethra and into their urinary bladder.   We will inject the urethra with a lidocaine gel prior to introducing the cystoscope to help with any discomfort during the procedure.   After the procedure, they might experience blood in the urine and discomfort with urination.  This will abate after the first few voids.  I have  encouraged the patient to increase water intake  during this time.  Patient denies any allergies to lidocaine.   - CULTURE, URINE COMPREHENSIVE - Urinalysis, Complete  2. SUI:   Patient describes a mild stress urinary incontinence.  We will address this once her hematuria workup is completed and no GU pathology is discovered.  3: UI:    Patient describes a moderate urinary incontinence.  We will address this once her hematuria workup is completed and no GU pathology is discovered.  Return for CT Urogram report and cystoscopy.  These notes generated with voice recognition software. I apologize for typographical errors.  Zara Council, [facility] [address] [place], [place] [address] [phone]

## 2015-11-12 ENCOUNTER — Encounter: Payer: Self-pay | Admitting: Urology

## 2015-11-12 DIAGNOSIS — N393 Stress incontinence (female) (male): Secondary | ICD-10-CM | POA: Insufficient documentation

## 2015-11-12 DIAGNOSIS — N3941 Urge incontinence: Secondary | ICD-10-CM | POA: Insufficient documentation

## 2015-11-12 DIAGNOSIS — R3129 Other microscopic hematuria: Secondary | ICD-10-CM | POA: Insufficient documentation

## 2015-11-12 LAB — CULTURE, URINE COMPREHENSIVE

## 2015-11-25 DIAGNOSIS — Z961 Presence of intraocular lens: Secondary | ICD-10-CM | POA: Diagnosis not present

## 2015-11-28 ENCOUNTER — Ambulatory Visit
Admission: RE | Admit: 2015-11-28 | Discharge: 2015-11-28 | Disposition: A | Discharge status: Home / Self Care | Payer: PPO | Source: Ambulatory Visit | Attending: Urology | Admitting: Urology

## 2015-11-28 DIAGNOSIS — R3129 Other microscopic hematuria: Secondary | ICD-10-CM

## 2015-11-28 DIAGNOSIS — Z90710 Acquired absence of both cervix and uterus: Secondary | ICD-10-CM | POA: Insufficient documentation

## 2015-11-28 LAB — POCT I-STAT CREATININE: CREATININE: 0.8 mg/dL (ref 0.44–1.00)

## 2015-11-28 MED ORDER — IOPAMIDOL (ISOVUE-300) INJECTION 61%
125.0000 mL | Freq: Once | INTRAVENOUS | Status: AC | PRN
  Administered 2015-11-28: 125 mL via INTRAVENOUS

## 2015-11-30 ENCOUNTER — Other Ambulatory Visit: Payer: PPO

## 2015-12-02 ENCOUNTER — Ambulatory Visit (INDEPENDENT_AMBULATORY_CARE_PROVIDER_SITE_OTHER): Payer: PPO | Admitting: Urology

## 2015-12-02 ENCOUNTER — Encounter: Payer: Self-pay | Admitting: Urology

## 2015-12-02 VITALS — BP 142/87 | HR 92 | Ht 64.0 in | Wt 164.3 lb

## 2015-12-02 DIAGNOSIS — N393 Stress incontinence (female) (male): Secondary | ICD-10-CM | POA: Diagnosis not present

## 2015-12-02 DIAGNOSIS — N3941 Urge incontinence: Secondary | ICD-10-CM | POA: Diagnosis not present

## 2015-12-02 DIAGNOSIS — R3129 Other microscopic hematuria: Secondary | ICD-10-CM | POA: Diagnosis not present

## 2015-12-02 LAB — MICROSCOPIC EXAMINATION
BACTERIA UA: NONE SEEN
WBC, UA: NONE SEEN /hpf (ref 0–?)

## 2015-12-02 LAB — URINALYSIS, COMPLETE
BILIRUBIN UA: NEGATIVE
Glucose, UA: NEGATIVE
Ketones, UA: NEGATIVE
LEUKOCYTES UA: NEGATIVE
Nitrite, UA: NEGATIVE
PH UA: 7 (ref 5.0–7.5)
PROTEIN UA: NEGATIVE
SPEC GRAV UA: 1.02 (ref 1.005–1.030)
Urobilinogen, Ur: 1 mg/dL (ref 0.2–1.0)

## 2015-12-02 MED ORDER — LIDOCAINE HCL 2 % EX GEL
1.0000 "application " | Freq: Once | CUTANEOUS | Status: AC
  Administered 2015-12-02: 1 via URETHRAL

## 2015-12-02 MED ORDER — CIPROFLOXACIN HCL 500 MG PO TABS
500.0000 mg | ORAL_TABLET | Freq: Once | ORAL | Status: AC
  Administered 2015-12-02: 500 mg via ORAL

## 2015-12-02 NOTE — Progress Notes (Signed)
   12/02/2015  HPI:  70  -year-old female with microscopic hematuria who presents today for office cystoscopy to complete her hematuria workup.  She did undergo further workup with a CT urogram which was negative for any GU pathology. This was reviewed personal today, results were reviewed with the patient.  +smoker  She does have a history of urinary frequency, urgency, and incontinence (mixed).   CT  Urogram 11/28/15 Study Result     CLINICAL DATA: Microscopic hematuria. Urinary urgency. Personal history of uterine carcinoma.  EXAM: CT ABDOMEN AND PELVIS WITHOUT AND WITH CONTRAST  TECHNIQUE: Multidetector CT imaging of the abdomen and pelvis was performed following the standard protocol before and following the bolus administration of intravenous contrast.  CONTRAST: 165mL ISOVUE-300 IOPAMIDOL (ISOVUE-300) INJECTION 61%  COMPARISON: None.  FINDINGS: Lower chest: No acute findings.  Hepatobiliary: No masses or other significant abnormality. Gallbladder is unremarkable.  Pancreas: No mass, inflammatory changes, or other significant abnormality.  Spleen: Within normal limits in size and appearance.  Adrenals/Urinary Tract: No adrenal masses identified. No evidence of urolithiasis or hydronephrosis. No complex cystic or solid renal masses are identified. No masses seen involving the lower urinary tract.  Stomach/Bowel: No evidence of obstruction, inflammatory process, or abnormal fluid collections. Normal appendix visualized.  Vascular/Lymphatic: No pathologically enlarged lymph nodes. No evidence of abdominal aortic aneurysm. Aortic atherosclerotic plaque noted.  Reproductive: Prior hysterectomy noted. Adnexal regions are unremarkable in appearance.  Other: None.  Musculoskeletal: No suspicious bone lesions identified.  IMPRESSION: No radiographic evidence of urinary tract neoplasm, urolithiasis, or hydronephrosis.  Previous  hysterectomy. No evidence of metastatic disease or other acute findings.   Electronically Signed  By: Earle Gell M.D.  On: 11/28/2015 16:48      Cystoscopy Procedure Note  Patient identification was confirmed, informed consent was obtained, and patient was prepped using Betadine solution.  Lidocaine jelly was administered per urethral meatus.    Preoperative abx where received prior to procedure.    Procedure: - Flexible cystoscope introduced, without any difficulty.   - Thorough search of the bladder revealed:    normal urethral meatus    normal urothelium    no stones    no ulcers     no tumors    no urethral polyps    no trabeculation  - Ureteral orifices were normal in position and appearance.  Post-Procedure: - Patient tolerated the procedure well  Assessment/ Plan:  1. Microscopic hematuria S/p negative CTU and cystoscopy Advised smoking cessation - Urinalysis, Complete - ciprofloxacin (CIPRO) tablet 500 mg; Take 1 tablet (500 mg total) by mouth once. - lidocaine (XYLOCAINE) 2 % jelly 1 application; Place 1 application into the urethra once.  2. Urge incontinence  discussed symptoms today. She is not interested in any further medical treatment of further workup.  3. SUI (stress urinary incontinence, female)  as above  F/u on prn basis  Hollice Espy, MD

## 2016-02-07 DIAGNOSIS — F17210 Nicotine dependence, cigarettes, uncomplicated: Secondary | ICD-10-CM | POA: Diagnosis not present

## 2016-02-07 DIAGNOSIS — J449 Chronic obstructive pulmonary disease, unspecified: Secondary | ICD-10-CM | POA: Diagnosis not present

## 2016-02-22 DIAGNOSIS — R0602 Shortness of breath: Secondary | ICD-10-CM | POA: Diagnosis not present

## 2016-02-23 DIAGNOSIS — E559 Vitamin D deficiency, unspecified: Secondary | ICD-10-CM | POA: Diagnosis not present

## 2016-02-23 DIAGNOSIS — E785 Hyperlipidemia, unspecified: Secondary | ICD-10-CM | POA: Diagnosis not present

## 2016-02-23 DIAGNOSIS — I10 Essential (primary) hypertension: Secondary | ICD-10-CM | POA: Diagnosis not present

## 2016-02-23 DIAGNOSIS — F17210 Nicotine dependence, cigarettes, uncomplicated: Secondary | ICD-10-CM | POA: Diagnosis not present

## 2016-03-14 DIAGNOSIS — Z86010 Personal history of colonic polyps: Secondary | ICD-10-CM | POA: Diagnosis not present

## 2016-05-22 DIAGNOSIS — Z23 Encounter for immunization: Secondary | ICD-10-CM | POA: Diagnosis not present

## 2016-05-22 DIAGNOSIS — J449 Chronic obstructive pulmonary disease, unspecified: Secondary | ICD-10-CM | POA: Diagnosis not present

## 2016-05-22 DIAGNOSIS — F17210 Nicotine dependence, cigarettes, uncomplicated: Secondary | ICD-10-CM | POA: Diagnosis not present

## 2016-05-25 ENCOUNTER — Encounter: Payer: Self-pay | Admitting: *Deleted

## 2016-05-28 ENCOUNTER — Ambulatory Visit
Admission: RE | Admit: 2016-05-28 | Discharge: 2016-05-28 | Disposition: A | Discharge status: Home / Self Care | Payer: PPO | Source: Ambulatory Visit | Attending: Unknown Physician Specialty | Admitting: Unknown Physician Specialty

## 2016-05-28 ENCOUNTER — Encounter: Payer: Self-pay | Admitting: Anesthesiology

## 2016-05-28 ENCOUNTER — Ambulatory Visit: Payer: PPO | Admitting: Anesthesiology

## 2016-05-28 ENCOUNTER — Encounter
Admission: RE | Disposition: A | Discharge status: Home / Self Care | Payer: Self-pay | Source: Ambulatory Visit | Attending: Unknown Physician Specialty

## 2016-05-28 DIAGNOSIS — I10 Essential (primary) hypertension: Secondary | ICD-10-CM | POA: Diagnosis not present

## 2016-05-28 DIAGNOSIS — F329 Major depressive disorder, single episode, unspecified: Secondary | ICD-10-CM | POA: Insufficient documentation

## 2016-05-28 DIAGNOSIS — K5730 Diverticulosis of large intestine without perforation or abscess without bleeding: Secondary | ICD-10-CM | POA: Diagnosis not present

## 2016-05-28 DIAGNOSIS — F17210 Nicotine dependence, cigarettes, uncomplicated: Secondary | ICD-10-CM | POA: Diagnosis not present

## 2016-05-28 DIAGNOSIS — E785 Hyperlipidemia, unspecified: Secondary | ICD-10-CM | POA: Insufficient documentation

## 2016-05-28 DIAGNOSIS — K621 Rectal polyp: Secondary | ICD-10-CM | POA: Diagnosis not present

## 2016-05-28 DIAGNOSIS — Z86010 Personal history of colonic polyps: Secondary | ICD-10-CM | POA: Insufficient documentation

## 2016-05-28 DIAGNOSIS — Z1211 Encounter for screening for malignant neoplasm of colon: Secondary | ICD-10-CM | POA: Diagnosis not present

## 2016-05-28 DIAGNOSIS — K648 Other hemorrhoids: Secondary | ICD-10-CM | POA: Diagnosis not present

## 2016-05-28 DIAGNOSIS — D125 Benign neoplasm of sigmoid colon: Secondary | ICD-10-CM | POA: Diagnosis not present

## 2016-05-28 DIAGNOSIS — Z8542 Personal history of malignant neoplasm of other parts of uterus: Secondary | ICD-10-CM | POA: Diagnosis not present

## 2016-05-28 DIAGNOSIS — Z79899 Other long term (current) drug therapy: Secondary | ICD-10-CM | POA: Diagnosis not present

## 2016-05-28 DIAGNOSIS — J449 Chronic obstructive pulmonary disease, unspecified: Secondary | ICD-10-CM | POA: Insufficient documentation

## 2016-05-28 DIAGNOSIS — K640 First degree hemorrhoids: Secondary | ICD-10-CM | POA: Diagnosis not present

## 2016-05-28 DIAGNOSIS — K635 Polyp of colon: Secondary | ICD-10-CM | POA: Diagnosis not present

## 2016-05-28 HISTORY — PX: COLONOSCOPY WITH PROPOFOL: SHX5780

## 2016-05-28 SURGERY — COLONOSCOPY WITH PROPOFOL
Anesthesia: General

## 2016-05-28 MED ORDER — SODIUM CHLORIDE 0.9 % IV SOLN
INTRAVENOUS | Status: DC
  Administered 2016-05-28: 13:00:00 via INTRAVENOUS
  Administered 2016-05-28: 1000 mL via INTRAVENOUS

## 2016-05-28 MED ORDER — METOPROLOL TARTRATE 50 MG PO TABS
50.0000 mg | ORAL_TABLET | Freq: Once | ORAL | Status: AC
  Administered 2016-05-28: 50 mg via ORAL

## 2016-05-28 MED ORDER — PROPOFOL 10 MG/ML IV BOLUS
INTRAVENOUS | Status: DC | PRN
  Administered 2016-05-28: 30 mg via INTRAVENOUS
  Administered 2016-05-28: 20 mg via INTRAVENOUS
  Administered 2016-05-28: 30 mg via INTRAVENOUS

## 2016-05-28 MED ORDER — PROPOFOL 500 MG/50ML IV EMUL
INTRAVENOUS | Status: DC | PRN
  Administered 2016-05-28: 100 ug/kg/min via INTRAVENOUS

## 2016-05-28 MED ORDER — SODIUM CHLORIDE 0.9 % IV SOLN
INTRAVENOUS | Status: DC
Start: 2016-05-28 — End: 2016-05-28

## 2016-05-28 MED ORDER — METOPROLOL TARTRATE 50 MG PO TABS
ORAL_TABLET | ORAL | Status: AC
  Filled 2016-05-28: qty 1

## 2016-05-28 NOTE — Anesthesia Preprocedure Evaluation (Addendum)
Anesthesia Evaluation  Patient identified by MRN, date of birth, ID band Patient awake    Reviewed: Allergy & Precautions, NPO status , Patient's Chart, lab work & pertinent test results, reviewed documented beta blocker date and time   Airway Mallampati: II  TM Distance: >3 FB     Dental  (+) Chipped   Pulmonary shortness of breath, asthma , COPD, Current Smoker,           Cardiovascular hypertension, Pt. on medications and Pt. on home beta blockers      Neuro/Psych PSYCHIATRIC DISORDERS Depression    GI/Hepatic   Endo/Other    Renal/GU      Musculoskeletal   Abdominal   Peds  Hematology   Anesthesia Other Findings Significant overbite.  Reproductive/Obstetrics                            Anesthesia Physical Anesthesia Plan  ASA: III  Anesthesia Plan: General   Post-op Pain Management:    Induction: Intravenous  Airway Management Planned: Nasal Cannula  Additional Equipment:   Intra-op Plan:   Post-operative Plan:   Informed Consent: I have reviewed the patients History and Physical, chart, labs and discussed the procedure including the risks, benefits and alternatives for the proposed anesthesia with the patient or authorized representative who has indicated his/her understanding and acceptance.     Plan Discussed with: CRNA  Anesthesia Plan Comments:         Anesthesia Quick Evaluation

## 2016-05-28 NOTE — Op Note (Signed)
[facility] Patient Name: Wendy Garza Procedure Date: 05/28/2016 12:49 PM MRN: [id number] Account #: [id number] Date of Birth: November 26, 1945 Admit Type: Outpatient Age: 70 Room: [facility] ENDO ROOM 4 Gender: Female Note Status: Finalized Procedure:            Colonoscopy Indications:          High risk colon cancer surveillance: Personal history                        of colonic polyps Providers:            Manya Silvas, MD Referring MD:         Allyne Gee, MD (Referring MD) Medicines:            Propofol per Anesthesia Complications:        No immediate complications. Procedure:            Pre-Anesthesia Assessment:                       - After reviewing the risks and benefits, the patient                        was deemed in satisfactory condition to undergo the                        procedure.                       After obtaining informed consent, the colonoscope was                        passed under direct vision. Throughout the procedure,                        the patient's blood pressure, pulse, and oxygen                        saturations were monitored continuously. The                        Colonoscope was introduced through the anus and                        advanced to the the cecum, identified by appendiceal                        orifice and ileocecal valve. The colonoscopy was                        performed without difficulty. The patient tolerated the                        procedure well. The quality of the bowel preparation                        was excellent. Findings:      Two sessile polyps were found in the rectum and sigmoid colon. The       polyps were diminutive in size. These polyps were removed with a jumbo       cold forceps. Resection and retrieval were complete.  A single small-mouthed diverticulum was found in the sigmoid colon.      Internal hemorrhoids were found during endoscopy. The  hemorrhoids were       small, medium-sized and Grade I (internal hemorrhoids that do not       prolapse).      The exam was otherwise without abnormality. Impression:           - Two diminutive polyps in the rectum and in the                        sigmoid colon, removed with a jumbo cold forceps.                        Resected and retrieved.                       - Diverticulosis in the sigmoid colon.                       - Internal hemorrhoids.                       - The examination was otherwise normal. Recommendation:       - Await pathology results. Manya Silvas, MD 05/28/2016 1:19:59 PM This report has been signed electronically. Number of Addenda: 0 Note Initiated On: 05/28/2016 12:49 PM Scope Withdrawal Time: 0 hours 14 minutes 32 seconds  Total Procedure Duration: 0 hours 22 minutes 46 seconds       [facility]

## 2016-05-28 NOTE — OR Nursing (Signed)
12:24 PM - Lopressor 25 mg po given  (1/2 of 50 mg Tab)...... Not 25 mg as seen on MAR.

## 2016-05-28 NOTE — Transfer of Care (Signed)
Immediate Anesthesia Transfer of Care Note  Patient: Wendy Garza  Procedure(s) Performed: Procedure(s): COLONOSCOPY WITH PROPOFOL (N/A)  Patient Location: PACU and Endoscopy Unit  Anesthesia Type:General  Level of Consciousness: awake  Airway & Oxygen Therapy: Patient Spontanous Breathing  Post-op Assessment: Report given to RN  Post vital signs: stable  Last Vitals:  Vitals:   05/28/16 1215  BP: (!) 129/57  Pulse: 84  Resp: 16  Temp: (!) 36.1 C    Last Pain:  Vitals:   05/28/16 1215  TempSrc: Tympanic         Complications: No apparent anesthesia complications

## 2016-05-28 NOTE — OR Nursing (Signed)
Correction....  12:24 - 25 mg. Tab (1/2 of 50 mg tab) given.  Not 50 mg as seen on MAR.

## 2016-05-28 NOTE — Anesthesia Postprocedure Evaluation (Signed)
Anesthesia Post Note  Patient: Wendy Garza  Procedure(s) Performed: Procedure(s) (LRB): COLONOSCOPY WITH PROPOFOL (N/A)  Patient location during evaluation: Endoscopy Anesthesia Type: General Level of consciousness: awake and alert Pain management: pain level controlled Vital Signs Assessment: post-procedure vital signs reviewed and stable Respiratory status: spontaneous breathing, nonlabored ventilation, respiratory function stable and patient connected to nasal cannula oxygen Cardiovascular status: blood pressure returned to baseline and stable Postop Assessment: no signs of nausea or vomiting Anesthetic complications: no    Last Vitals:  Vitals:   05/28/16 1343 05/28/16 1354  BP: (!) 121/99 119/61  Pulse: 74 69  Resp: (!) 24 15  Temp:      Last Pain:  Vitals:   05/28/16 1324  TempSrc: Tympanic                 [name] S

## 2016-05-28 NOTE — H&P (Signed)
Primary Care Physician:  Pcp Not In System Primary Gastroenterologist:  Dr. Vira Agar  Pre-Procedure History & Physical: HPI:  Wendy Garza is a 70 y.o. female is here for an colonoscopy.   Past Medical History:  Diagnosis Date  . Asthma   . Cancer [facility]) 2003   UTERINE, total Hysterectomy  . COPD (chronic obstructive pulmonary disease) ([place])   . Depression   . Hyperlipidemia   . Hypertension   . Shortness of breath dyspnea    DOE    Past Surgical History:  Procedure Laterality Date  . ABDOMINAL HYSTERECTOMY    . BREAST SURGERY    . CATARACT EXTRACTION W/PHACO Left 10/04/2015   Procedure: CATARACT EXTRACTION PHACO AND INTRAOCULAR LENS PLACEMENT (IOC);  Surgeon: Birder Robson, MD;  Location: [facility] ORS;  Service: Ophthalmology;  Laterality: Left;  [place]: 00:46.8 AP%: 21.7 CDE: 10.19 Lot # [id number]  . CATARACT EXTRACTION W/PHACO Right 10/25/2015   Procedure: CATARACT EXTRACTION PHACO AND INTRAOCULAR LENS PLACEMENT ([place]);  Surgeon: Birder Robson, MD;  Location: [facility] ORS;  Service: Ophthalmology;  Laterality: Right;  [place] 00:48 AP% 25.2 CDE 12.16 fluid pack lot # [id number]    Prior to Admission medications   Medication Sig Start Date End Date Taking? Authorizing Provider  albuterol (PROVENTIL HFA;VENTOLIN HFA) 108 (90 Base) MCG/ACT inhaler Inhale into the lungs every 6 (six) hours as needed for wheezing or shortness of breath. Reported on 11/10/2015   Yes Historical Provider, MD  budesonide-formoterol (SYMBICORT) 80-4.5 MCG/ACT inhaler Inhale into the lungs 2 (two) times daily.   Yes Historical Provider, MD  Calcium Carbonate 500 (200 Ca) MG WAFR Take by mouth daily.   Yes Historical Provider, MD  fluticasone (FLONASE) 50 MCG/ACT nasal spray Place into both nostrils daily. Reported on 11/10/2015   Yes Historical Provider, MD  metoprolol (LOPRESSOR) 50 MG tablet Take 25 mg by mouth 2 (two) times daily.   Yes Historical Provider, MD  rosuvastatin (CRESTOR) 20 MG tablet Take 20 mg by  mouth daily.   Yes Historical Provider, MD  sertraline (ZOLOFT) 50 MG tablet Take 50 mg by mouth daily.   Yes Historical Provider, MD  Vitamin D, Ergocalciferol, (DRISDOL) 50000 units CAPS capsule TAKE ONE CAPSULE BY MOUTH ONCE WEEKLY FOR VITAMIN D DEFICIENCY 10/30/15  Yes Historical Provider, MD    Allergies as of 04/06/2016 - Review Complete 12/02/2015  Allergen Reaction Noted  . Aspirin  10/03/2015  . Nickel Rash 11/10/2015    Family History  Problem Relation Age of Onset  . Kidney disease Mother   . Kidney cancer Neg Hx   . Prostate cancer Neg Hx     Social History   Social History  . Marital status: Single    Spouse name: N/A  . Number of children: N/A  . Years of education: N/A   Occupational History  . Not on file.   Social History Main Topics  . Smoking status: Current Every Day Smoker    Packs/day: 1.00    Years: 25.00    Types: Cigarettes  . Smokeless tobacco: Not on file  . Alcohol use No  . Drug use: Unknown  . Sexual activity: Not on file   Other Topics Concern  . Not on file   Social History Narrative  . No narrative on file    Review of Systems: See HPI, otherwise negative ROS  Physical Exam: BP (!) 129/57   Pulse 84   Temp (!) 96.9 F (36.1 C) (Tympanic)   Resp 16  Ht 5\' 4"  (1.626 m)   Wt 72.6 kg (160 lb)   LMP  (LMP Unknown)   SpO2 99%   BMI 27.46 kg/m  General:   Alert,  pleasant and cooperative in NAD Head:  Normocephalic and atraumatic. Neck:  Supple; no masses or thyromegaly. Lungs:  Clear throughout to auscultation.    Heart:  Regular rate and rhythm. Abdomen:  Soft, nontender and nondistended. Normal bowel sounds, without guarding, and without rebound.   Neurologic:  Alert and  oriented x4;  grossly normal neurologically.  Impression/Plan: Wendy Garza is here for an colonoscopy to be performed for [facility] colon polyps  Risks, benefits, limitations, and alternatives regarding  colonoscopy have been reviewed with the patient.   Questions have been answered.  All parties agreeable.   Gaylyn Cheers, MD  05/28/2016, 12:38 PM

## 2016-05-29 ENCOUNTER — Encounter: Payer: Self-pay | Admitting: Unknown Physician Specialty

## 2016-05-29 LAB — SURGICAL PATHOLOGY

## 2016-06-28 DIAGNOSIS — H26491 Other secondary cataract, right eye: Secondary | ICD-10-CM | POA: Diagnosis not present

## 2016-08-29 DIAGNOSIS — I10 Essential (primary) hypertension: Secondary | ICD-10-CM | POA: Diagnosis not present

## 2016-08-29 DIAGNOSIS — Z0001 Encounter for general adult medical examination with abnormal findings: Secondary | ICD-10-CM | POA: Diagnosis not present

## 2016-08-29 DIAGNOSIS — F17210 Nicotine dependence, cigarettes, uncomplicated: Secondary | ICD-10-CM | POA: Diagnosis not present

## 2016-08-29 DIAGNOSIS — J449 Chronic obstructive pulmonary disease, unspecified: Secondary | ICD-10-CM | POA: Diagnosis not present

## 2016-08-29 DIAGNOSIS — E785 Hyperlipidemia, unspecified: Secondary | ICD-10-CM | POA: Diagnosis not present

## 2016-09-07 DIAGNOSIS — E782 Mixed hyperlipidemia: Secondary | ICD-10-CM | POA: Diagnosis not present

## 2016-09-07 DIAGNOSIS — E559 Vitamin D deficiency, unspecified: Secondary | ICD-10-CM | POA: Diagnosis not present

## 2016-09-07 DIAGNOSIS — Z0001 Encounter for general adult medical examination with abnormal findings: Secondary | ICD-10-CM | POA: Diagnosis not present

## 2016-09-07 DIAGNOSIS — I10 Essential (primary) hypertension: Secondary | ICD-10-CM | POA: Diagnosis not present

## 2016-09-28 DIAGNOSIS — Z1231 Encounter for screening mammogram for malignant neoplasm of breast: Secondary | ICD-10-CM | POA: Diagnosis not present

## 2016-09-28 DIAGNOSIS — M8588 Other specified disorders of bone density and structure, other site: Secondary | ICD-10-CM | POA: Diagnosis not present

## 2016-09-28 DIAGNOSIS — M85852 Other specified disorders of bone density and structure, left thigh: Secondary | ICD-10-CM | POA: Diagnosis not present

## 2016-10-11 ENCOUNTER — Other Ambulatory Visit: Payer: Self-pay

## 2016-11-20 DIAGNOSIS — R0602 Shortness of breath: Secondary | ICD-10-CM | POA: Diagnosis not present

## 2016-11-20 DIAGNOSIS — F17210 Nicotine dependence, cigarettes, uncomplicated: Secondary | ICD-10-CM | POA: Diagnosis not present

## 2016-11-20 DIAGNOSIS — J449 Chronic obstructive pulmonary disease, unspecified: Secondary | ICD-10-CM | POA: Diagnosis not present

## 2017-03-05 DIAGNOSIS — F17210 Nicotine dependence, cigarettes, uncomplicated: Secondary | ICD-10-CM | POA: Diagnosis not present

## 2017-03-05 DIAGNOSIS — I10 Essential (primary) hypertension: Secondary | ICD-10-CM | POA: Diagnosis not present

## 2017-03-05 DIAGNOSIS — F411 Generalized anxiety disorder: Secondary | ICD-10-CM | POA: Diagnosis not present

## 2017-03-05 DIAGNOSIS — E785 Hyperlipidemia, unspecified: Secondary | ICD-10-CM | POA: Diagnosis not present

## 2017-04-12 IMAGING — CT CT ABD-PEL WO/W CM
1 of 4 series · 8 of 32 positions shown, 13 images · IV contrast (APPLIED)
Comparison: None.

CLINICAL DATA: Microscopic hematuria. Urinary urgency. Personal
history of uterine carcinoma.

EXAM:
CT ABDOMEN AND PELVIS WITHOUT AND WITH CONTRAST
TECHNIQUE: Multidetector CT imaging of the abdomen and pelvis was performed
following the standard protocol before and following the bolus
administration of intravenous contrast.
CONTRAST:  125mL Q4LGCM-7TT IOPAMIDOL (Q4LGCM-7TT) INJECTION 61%

[Series 12: axial delay · axial · delayed · 0.69mm/px · z∈[-958,-588]mm · 8 of 96 slices shown, 13 images]
[im 11/96  soft-tissue]
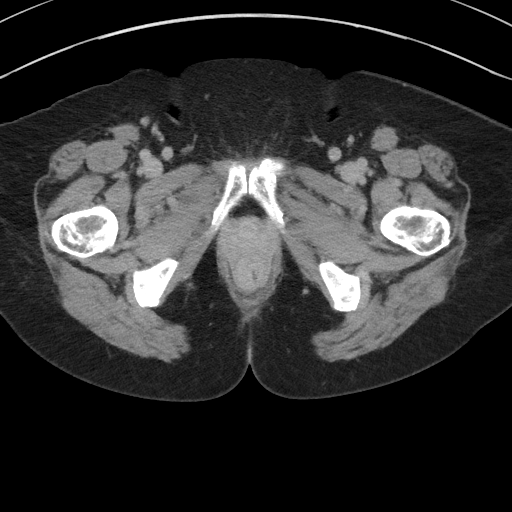
[im 11/96  bone]
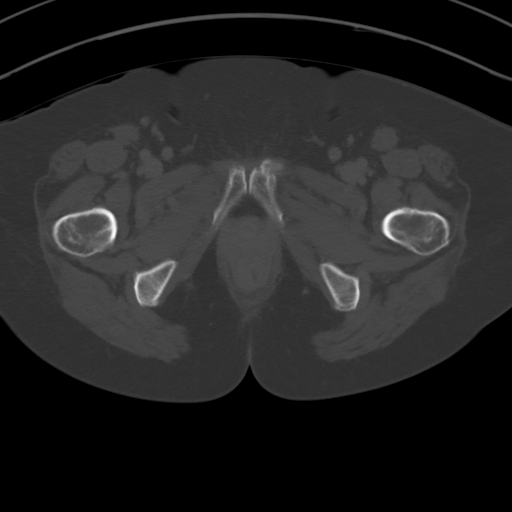
[im 22/96  soft-tissue]
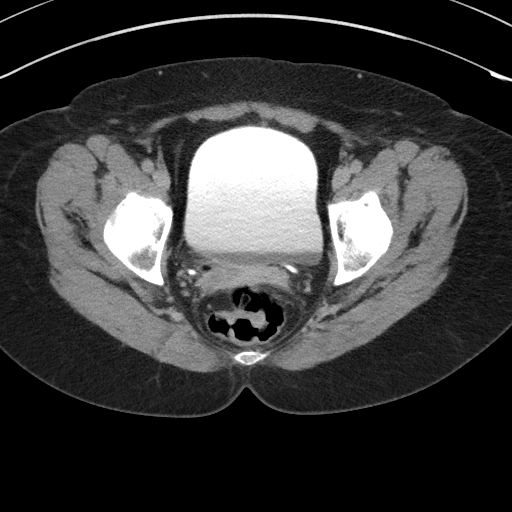
[im 32/96  soft-tissue]
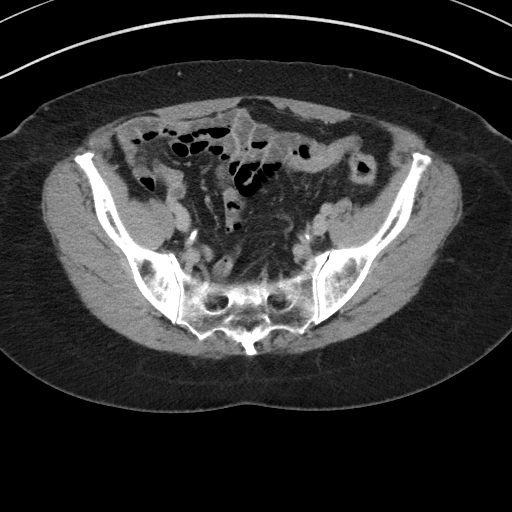
[im 43/96  soft-tissue]
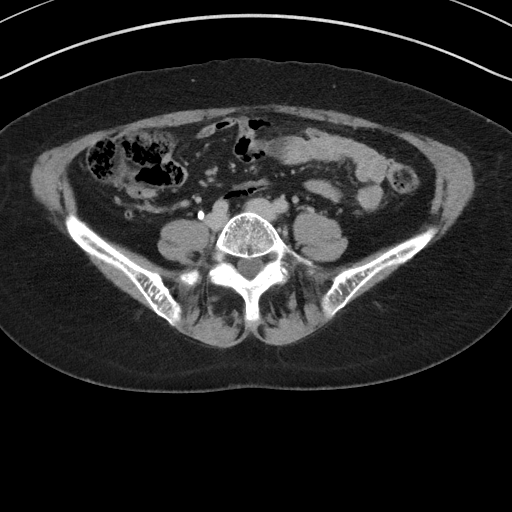
[im 53/96  soft-tissue]
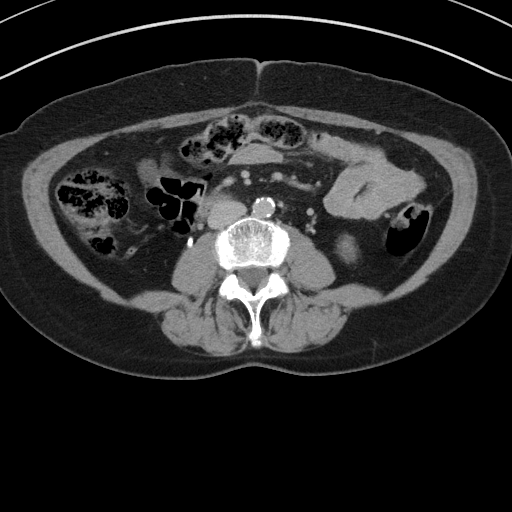
[im 53/96  lung]
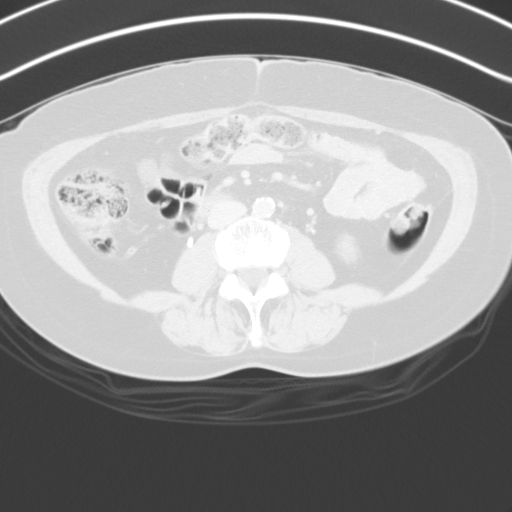
[im 64/96  soft-tissue]
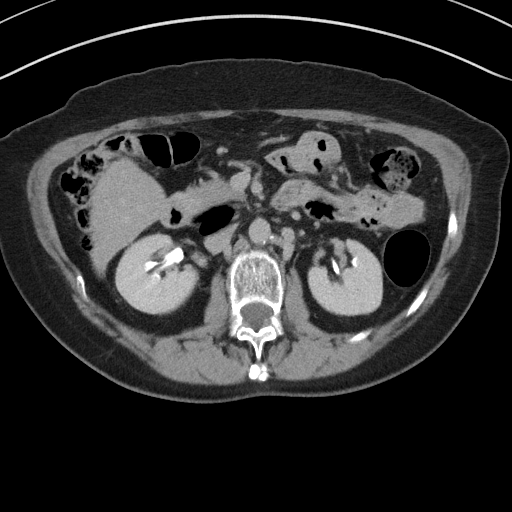
[im 64/96  lung]
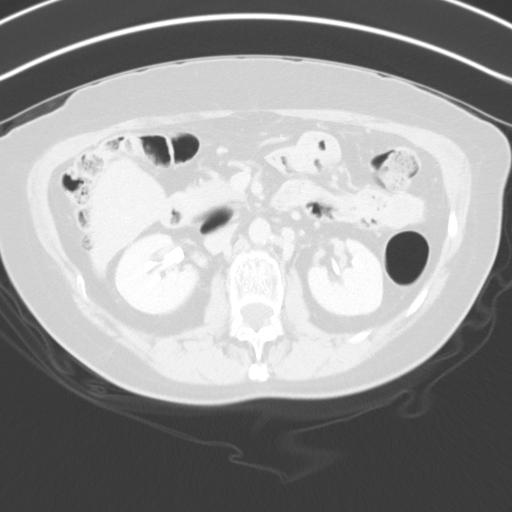
[im 74/96  soft-tissue]
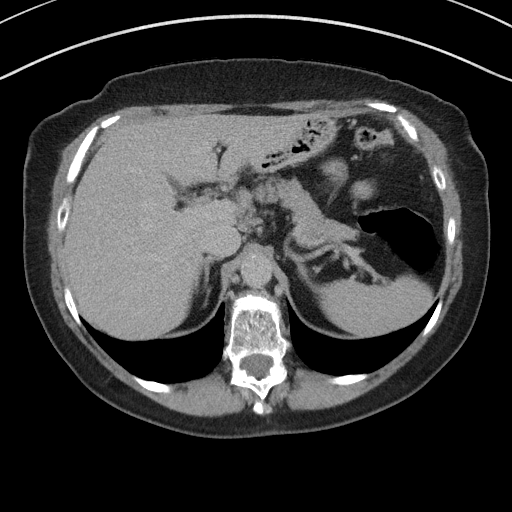
[im 74/96  lung]
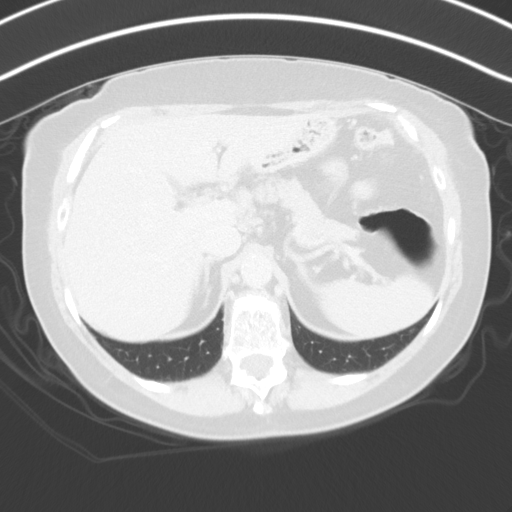
[im 85/96  soft-tissue]
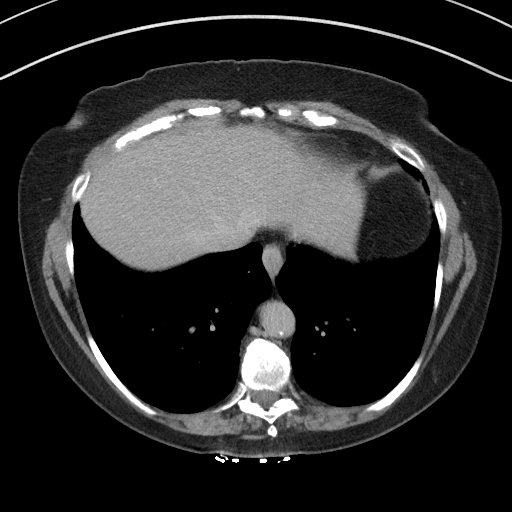
[im 85/96  lung]
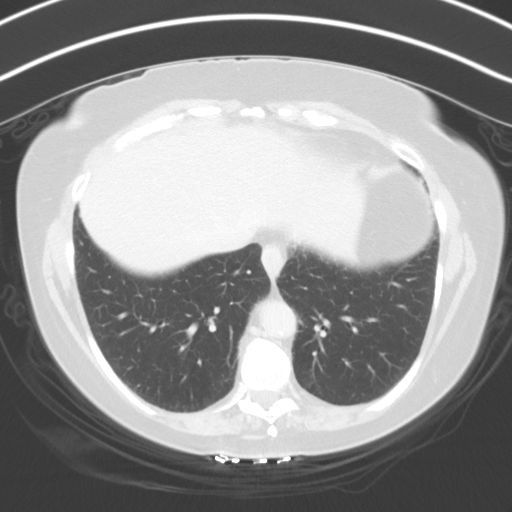

[8 of 32 positions shown; findings below may reference images not displayed]

FINDINGS: Lower chest:  No acute findings.

Hepatobiliary: No masses or other significant abnormality.
Gallbladder is unremarkable.

Pancreas: No mass, inflammatory changes, or other significant
abnormality.

Spleen: Within normal limits in size and appearance.

Adrenals/Urinary Tract: No adrenal masses identified. No evidence of
urolithiasis or hydronephrosis. No complex cystic or solid renal
masses are identified. No masses seen involving the lower urinary
tract.

Stomach/Bowel: No evidence of obstruction, inflammatory process, or
abnormal fluid collections. Normal appendix visualized.

Vascular/Lymphatic: No pathologically enlarged lymph nodes. No
evidence of abdominal aortic aneurysm. Aortic atherosclerotic plaque
noted.

Reproductive: Prior hysterectomy noted. Adnexal regions are
unremarkable in appearance.

Other: None.

Musculoskeletal:  No suspicious bone lesions identified.
IMPRESSION: No radiographic evidence of urinary tract neoplasm, urolithiasis, or
hydronephrosis.

Previous hysterectomy. No evidence of metastatic disease or other
acute findings.

## 2017-09-03 ENCOUNTER — Ambulatory Visit: Payer: Self-pay | Admitting: Nurse Practitioner

## 2017-10-08 ENCOUNTER — Ambulatory Visit (INDEPENDENT_AMBULATORY_CARE_PROVIDER_SITE_OTHER): Payer: PPO | Admitting: Nurse Practitioner

## 2017-10-08 ENCOUNTER — Encounter: Payer: Self-pay | Admitting: Nurse Practitioner

## 2017-10-08 VITALS — BP 137/80 | HR 79 | Resp 16 | Ht 64.0 in | Wt 165.0 lb

## 2017-10-08 DIAGNOSIS — Z1231 Encounter for screening mammogram for malignant neoplasm of breast: Secondary | ICD-10-CM

## 2017-10-08 DIAGNOSIS — Z1239 Encounter for other screening for malignant neoplasm of breast: Secondary | ICD-10-CM

## 2017-10-08 DIAGNOSIS — F411 Generalized anxiety disorder: Secondary | ICD-10-CM

## 2017-10-08 DIAGNOSIS — E782 Mixed hyperlipidemia: Secondary | ICD-10-CM | POA: Diagnosis not present

## 2017-10-08 DIAGNOSIS — E559 Vitamin D deficiency, unspecified: Secondary | ICD-10-CM | POA: Diagnosis not present

## 2017-10-08 DIAGNOSIS — I10 Essential (primary) hypertension: Secondary | ICD-10-CM | POA: Diagnosis not present

## 2017-10-08 DIAGNOSIS — J4520 Mild intermittent asthma, uncomplicated: Secondary | ICD-10-CM | POA: Diagnosis not present

## 2017-10-08 MED ORDER — ALBUTEROL SULFATE HFA 108 (90 BASE) MCG/ACT IN AERS: 2.0000 | INHALATION_SPRAY | Freq: Four times a day (QID) | RESPIRATORY_TRACT | 5 refills | Status: DC | PRN

## 2017-10-08 NOTE — Progress Notes (Signed)
[facility] [place], [place] [address]  Internal MEDICINE  Office Visit Note  Patient Name: Wendy Garza  [id number]  [id number]  Date of Service: 10/08/2017  No chief complaint on file.   Hypertension  This is a chronic problem. The current episode started more than 1 year ago. The problem is unchanged. The problem is controlled. Pertinent negatives include no chest pain, headaches, neck pain, palpitations or shortness of breath. There are no associated agents to hypertension. Risk factors for coronary artery disease include post-menopausal state and dyslipidemia. Past treatments include beta blockers. The current treatment provides moderate improvement. There are no compliance problems.     Pt is here for routine follow up.    Current Medication: Outpatient Encounter Medications as of 10/08/2017  Medication Sig Note  . albuterol (PROVENTIL HFA;VENTOLIN HFA) 108 (90 Base) MCG/ACT inhaler Inhale 2 puffs into the lungs every 6 (six) hours as needed for wheezing or shortness of breath. Reported on 11/10/2015   . budesonide-formoterol (SYMBICORT) 80-4.5 MCG/ACT inhaler Inhale into the lungs 2 (two) times daily.   . Calcium Carbonate 500 (200 Ca) MG WAFR Take by mouth daily.   . metoprolol (LOPRESSOR) 50 MG tablet Take 25 mg by mouth 2 (two) times daily.   . rosuvastatin (CRESTOR) 20 MG tablet Take 20 mg by mouth daily.   . sertraline (ZOLOFT) 50 MG tablet Take 50 mg by mouth daily.   . [DISCONTINUED] albuterol (PROVENTIL HFA;VENTOLIN HFA) 108 (90 Base) MCG/ACT inhaler Inhale into the lungs every 6 (six) hours as needed for wheezing or shortness of breath. Reported on 11/10/2015   . fluticasone (FLONASE) 50 MCG/ACT nasal spray Place into both nostrils daily. Reported on 11/10/2015   . Vitamin D, Ergocalciferol, (DRISDOL) 50000 units CAPS capsule TAKE ONE CAPSULE BY MOUTH ONCE WEEKLY FOR VITAMIN D DEFICIENCY 11/10/2015: Received from: External Pharmacy   No  facility-administered encounter medications on file as of 10/08/2017.     Surgical History: Past Surgical History:  Procedure Laterality Date  . ABDOMINAL HYSTERECTOMY    . BREAST SURGERY    . CATARACT EXTRACTION W/PHACO Left 10/04/2015   Procedure: CATARACT EXTRACTION PHACO AND INTRAOCULAR LENS PLACEMENT (IOC);  Surgeon: Birder Robson, MD;  Location: [facility] ORS;  Service: Ophthalmology;  Laterality: Left;  [place]: 00:46.8 AP%: 21.7 CDE: 10.19 Lot # [id number]  . CATARACT EXTRACTION W/PHACO Right 10/25/2015   Procedure: CATARACT EXTRACTION PHACO AND INTRAOCULAR LENS PLACEMENT ([place]);  Surgeon: Birder Robson, MD;  Location: [facility] ORS;  Service: Ophthalmology;  Laterality: Right;  [place] 00:48 AP% 25.2 CDE 12.16 fluid pack lot # [id number]  . COLONOSCOPY WITH PROPOFOL N/A 05/28/2016   Procedure: COLONOSCOPY WITH PROPOFOL;  Surgeon: Manya Silvas, MD;  Location: [facility] END[facility]COPY;  Service: Endoscopy;  Laterality: N/A;    Medical History: Past Medical History:  Diagnosis Date  . Asthma   . Cancer [facility]) 2003   UTERINE, total Hysterectomy  . COPD (chronic obstructive pulmonary disease) ([place])   . Depression   . Hyperlipidemia   . Hypertension   . Shortness of breath dyspnea    DOE    Family History: Family History  Problem Relation Age of Onset  . Kidney disease Mother   . Kidney cancer Neg Hx   . Prostate cancer Neg Hx     Social History   Socioeconomic History  . Marital status: Single    Spouse name: Not on file  . Number of children: Not on file  . Years of education: Not  on file  . Highest education level: Not on file  Social Needs  . Financial resource strain: Not on file  . Food insecurity - worry: Not on file  . Food insecurity - inability: Not on file  . Transportation needs - medical: Not on file  . Transportation needs - non-medical: Not on file  Occupational History  . Not on file  Tobacco Use  . Smoking status: Current Every Day Smoker    Packs/day: 1.00     Years: 25.00    Pack years: 25.00    Types: Cigarettes  . Smokeless tobacco: Never Used  Substance and Sexual Activity  . Alcohol use: No    Alcohol/week: 0.0 oz  . Drug use: No  . Sexual activity: Not on file  Other Topics Concern  . Not on file  Social History Narrative  . Not on file      Review of Systems  Constitutional: Negative for activity change, chills, fatigue and unexpected weight change.  HENT: Negative for congestion, postnasal drip, rhinorrhea, sneezing and sore throat.   Eyes: Negative.  Negative for redness.  Respiratory: Negative for cough, chest tightness, shortness of breath and wheezing.   Cardiovascular: Negative for chest pain and palpitations.  Gastrointestinal: Negative for abdominal pain, constipation, diarrhea, nausea and vomiting.  Endocrine: Negative for cold intolerance, heat intolerance, polydipsia, polyphagia and polyuria.  Genitourinary: Negative.  Negative for dysuria and frequency.  Musculoskeletal: Negative for arthralgias, back pain, joint swelling and neck pain.  Skin: Negative for rash.  Allergic/Immunologic: Positive for environmental allergies.  Neurological: Negative for tremors, numbness and headaches.  Hematological: Negative for adenopathy. Does not bruise/bleed easily.  Psychiatric/Behavioral: Negative for behavioral problems (Depression), sleep disturbance and suicidal ideas. The patient is not nervous/anxious.     Today's Vitals   10/08/17 1632  BP: 137/80  Pulse: 79  Resp: 16  SpO2: 94%  Weight: 165 lb (74.8 kg)  Height: 5\' 4"  (1.626 m)    Physical Exam  Constitutional: She is oriented to person, place, and time. She appears well-developed and well-nourished. No distress.  HENT:  Head: Normocephalic and atraumatic.  Mouth/Throat: Oropharynx is clear and moist. No oropharyngeal exudate.  Eyes: EOM are normal. Pupils are equal, round, and reactive to light.  Neck: Normal range of motion. Neck supple. No JVD present.  Carotid bruit is not present. No tracheal deviation present. No thyromegaly present.  Cardiovascular: Normal rate, regular rhythm and normal heart sounds. Exam reveals no gallop and no friction rub.  No murmur heard. Pulmonary/Chest: Effort normal and breath sounds normal. No respiratory distress. She has no wheezes. She has no rales. She exhibits no tenderness.  Abdominal: Soft. Bowel sounds are normal. There is no tenderness.  Musculoskeletal: Normal range of motion.  Lymphadenopathy:    She has no cervical adenopathy.  Neurological: She is alert and oriented to person, place, and time. No cranial nerve deficit.  Skin: Skin is warm and dry. She is not diaphoretic.  Psychiatric: She has a normal mood and affect. Her behavior is normal. Judgment and thought content normal.  Nursing note and vitals reviewed.   Assessment/Plan: 1. Mild intermittent asthma without complication Stable.  - albuterol (PROVENTIL HFA;VENTOLIN HFA) 108 (90 Base) MCG/ACT inhaler; Inhale 2 puffs into the lungs every 6 (six) hours as needed for wheezing or shortness of breath. Reported on 11/10/2015  Dispense: 1 Inhaler; Refill: 5  2. Mixed hyperlipidemia Check labs and adjust crestor as indicated  - Comprehensive metabolic panel - Lipid panel  3. Essential hypertension Stable. Continue bp medication as prescribed.  - CBC with Differential/Platelet - Comprehensive metabolic panel - T4, free - TSH  4. Generalized anxiety disorder Stable. Continue sertraline as prescribed   5. Screening for breast cancer - MM DIGITAL SCREENING BILATERAL; Future  6. Vitamin D deficiency - Vitamin D 1,25 dihydroxy  General Counseling: Wendy Garza verbalizes understanding of the findings of todays visit and agrees with plan of treatment. I have discussed any further diagnostic evaluation that may be needed or ordered today. We also reviewed her medications today. she has been encouraged to call the office with any questions or  concerns that should arise related to todays visit.   This patient was seen by Leretha Pol, FNP- C in Collaboration with Dr Lavera Guise as a part of collaborative care agreement    Orders Placed This Encounter  Procedures  . MM DIGITAL SCREENING BILATERAL  . CBC with Differential/Platelet  . Comprehensive metabolic panel  . T4, free  . TSH  . Lipid panel  . Vitamin D 1,25 dihydroxy    Meds ordered this encounter  Medications  . albuterol (PROVENTIL HFA;VENTOLIN HFA) 108 (90 Base) MCG/ACT inhaler    Sig: Inhale 2 puffs into the lungs every 6 (six) hours as needed for wheezing or shortness of breath. Reported on 11/10/2015    Dispense:  1 Inhaler    Refill:  5    Order Specific Question:   Supervising Provider    Answer:   Lavera Guise [[id number]]    Time spent: 41 Minutes          Dr Lavera Guise Internal medicine

## 2017-10-09 ENCOUNTER — Other Ambulatory Visit: Payer: Self-pay | Admitting: Internal Medicine

## 2017-10-16 ENCOUNTER — Ambulatory Visit (INDEPENDENT_AMBULATORY_CARE_PROVIDER_SITE_OTHER): Payer: PPO | Admitting: Internal Medicine

## 2017-10-16 ENCOUNTER — Telehealth: Payer: Self-pay

## 2017-10-16 DIAGNOSIS — R0602 Shortness of breath: Secondary | ICD-10-CM | POA: Diagnosis not present

## 2017-10-16 NOTE — Telephone Encounter (Signed)
-----   Message from Ronnell Freshwater, NP sent at 10/16/2017  2:59 PM EST ----- Please let her know that her labs looked great. thanks

## 2017-10-16 NOTE — Telephone Encounter (Signed)
Left vm that labs were good.  dbs

## 2017-10-17 DIAGNOSIS — J4520 Mild intermittent asthma, uncomplicated: Secondary | ICD-10-CM | POA: Insufficient documentation

## 2017-10-17 DIAGNOSIS — E559 Vitamin D deficiency, unspecified: Secondary | ICD-10-CM | POA: Insufficient documentation

## 2017-10-18 DIAGNOSIS — H43813 Vitreous degeneration, bilateral: Secondary | ICD-10-CM | POA: Diagnosis not present

## 2017-10-21 LAB — COMPREHENSIVE METABOLIC PANEL
A/G RATIO: 1.8 (ref 1.2–2.2)
ALBUMIN: 4.4 g/dL (ref 3.6–4.8)
ALK PHOS: 62 IU/L (ref 39–117)
ALT: 18 IU/L (ref 0–32)
AST: 21 IU/L (ref 0–40)
BILIRUBIN TOTAL: 0.3 mg/dL (ref 0.0–1.2)
BUN / CREAT RATIO: 18 (ref 12–28)
BUN: 16 mg/dL (ref 8–27)
CHLORIDE: 106 mmol/L (ref 96–106)
CO2: 21 mmol/L (ref 20–29)
CREATININE: 0.89 mg/dL (ref 0.57–1.00)
Calcium: 9.4 mg/dL (ref 8.7–10.3)
GFR calc Af Amer: 76 mL/min/{1.73_m2} (ref >59)
GFR calc non Af Amer: 66 mL/min/{1.73_m2} (ref >59)
GLOBULIN, TOTAL: 2.5 g/dL (ref 1.5–4.5)
Glucose: 109 mg/dL — ABNORMAL HIGH (ref 65–99)
Potassium: 4.5 mmol/L (ref 3.5–5.2)
SODIUM: 142 mmol/L (ref 134–144)
Total Protein: 6.9 g/dL (ref 6.0–8.5)

## 2017-10-21 LAB — CBC WITH DIFFERENTIAL/PLATELET
Basophils Absolute: 0 10*3/uL (ref 0.0–0.2)
Basos: 1 %
EOS (ABSOLUTE): 0.2 10*3/uL (ref 0.0–0.4)
EOS: 3 %
HEMATOCRIT: 42.4 % (ref 34.0–46.6)
HEMOGLOBIN: 14.4 g/dL (ref 11.1–15.9)
Immature Grans (Abs): 0 10*3/uL (ref 0.0–0.1)
Immature Granulocytes: 0 %
LYMPHS ABS: 2 10*3/uL (ref 0.7–3.1)
Lymphs: 23 %
MCH: 32.7 pg (ref 26.6–33.0)
MCHC: 34 g/dL (ref 31.5–35.7)
MCV: 96 fL (ref 79–97)
MONOCYTES: 11 %
Monocytes Absolute: 1 10*3/uL — ABNORMAL HIGH (ref 0.1–0.9)
NEUTROS ABS: 5.6 10*3/uL (ref 1.4–7.0)
Neutrophils: 62 %
Platelets: 289 10*3/uL (ref 150–379)
RBC: 4.4 x10E6/uL (ref 3.77–5.28)
RDW: 13.6 % (ref 12.3–15.4)
WBC: 8.9 10*3/uL (ref 3.4–10.8)

## 2017-10-21 LAB — VITAMIN D 1,25 DIHYDROXY
VITAMIN D3 1, 25 (OH): 12 pg/mL
Vitamin D 1, 25 (OH)2 Total: 47 pg/mL
Vitamin D2 1, 25 (OH)2: 35 pg/mL

## 2017-10-21 LAB — LIPID PANEL
CHOL/HDL RATIO: 3.7 ratio (ref 0.0–4.4)
Cholesterol, Total: 108 mg/dL (ref 100–199)
HDL: 29 mg/dL — AB (ref >39)
LDL Calculated: 53 mg/dL (ref 0–99)
Triglycerides: 130 mg/dL (ref 0–149)
VLDL Cholesterol Cal: 26 mg/dL (ref 5–40)

## 2017-10-21 LAB — TSH: TSH: 3.92 u[IU]/mL (ref 0.450–4.500)

## 2017-10-21 LAB — T4, FREE: FREE T4: 0.92 ng/dL (ref 0.82–1.77)

## 2017-10-21 NOTE — Progress Notes (Signed)
Let pt know about her labs.

## 2017-10-31 ENCOUNTER — Encounter: Payer: Self-pay | Admitting: Internal Medicine

## 2017-10-31 NOTE — Progress Notes (Signed)
[facility] [place], [id number]  DATE OF SERVICE:  October 16, 2017  Complete Pulmonary Function Testing Interpretation:  FINDINGS:   forced vital capacity is normal the FEV1 is normal FEV1 FVC ratio is mildly decreased.  Post bronchodilator there is no significant improvement in the FEV 1 however clinically improving may occur in the absence of spirometric improvement.  Total lung capacity is within normal limits by body box residual volume is normal residual volume total lung capacity ratio is normal FRC is increased.  DLCO is severely decreased.  IMPRESSION:    This pulmonary function is within normal limits.  DLCO severely reduced clinical correlation is recommended.  There is no significant change in the FEV1 after bronchodilators were administered  Allyne Gee, MD [facility] Medicine Sleep Medicine

## 2017-10-31 NOTE — Patient Instructions (Signed)

## 2017-12-03 ENCOUNTER — Encounter: Payer: Self-pay | Admitting: Internal Medicine

## 2017-12-03 ENCOUNTER — Ambulatory Visit (INDEPENDENT_AMBULATORY_CARE_PROVIDER_SITE_OTHER): Payer: PPO | Admitting: Internal Medicine

## 2017-12-03 VITALS — BP 122/60 | HR 92 | Resp 16 | Ht 64.0 in | Wt 163.4 lb

## 2017-12-03 DIAGNOSIS — J449 Chronic obstructive pulmonary disease, unspecified: Secondary | ICD-10-CM | POA: Diagnosis not present

## 2017-12-03 DIAGNOSIS — N390 Urinary tract infection, site not specified: Secondary | ICD-10-CM

## 2017-12-03 DIAGNOSIS — F17210 Nicotine dependence, cigarettes, uncomplicated: Secondary | ICD-10-CM | POA: Insufficient documentation

## 2017-12-03 DIAGNOSIS — R300 Dysuria: Secondary | ICD-10-CM | POA: Diagnosis not present

## 2017-12-03 LAB — POCT URINALYSIS DIPSTICK
Bilirubin, UA: NEGATIVE
Glucose, UA: NEGATIVE
KETONES UA: NEGATIVE
LEUKOCYTES UA: NEGATIVE
Nitrite, UA: POSITIVE
PH UA: 5 (ref 5.0–8.0)
PROTEIN UA: NEGATIVE
SPEC GRAV UA: 1.025 (ref 1.010–1.025)
UROBILINOGEN UA: 0.2 U/dL

## 2017-12-03 MED ORDER — SULFAMETHOXAZOLE-TRIMETHOPRIM 400-80 MG PO TABS
1.0000 | ORAL_TABLET | Freq: Two times a day (BID) | ORAL | 0 refills | Status: DC
Start: 2017-12-03 — End: 2018-01-28

## 2017-12-03 NOTE — Patient Instructions (Addendum)
Pneumococcal Conjugate Vaccine suspension for injection What is this medicine? PNEUMOCOCCAL VACCINE (NEU mo KOK al vak SEEN) is a vaccine used to prevent pneumococcus bacterial infections. These bacteria can cause serious infections like pneumonia, meningitis, and blood infections. This vaccine will lower your chance of getting pneumonia. If you do get pneumonia, it can make your symptoms milder and your illness shorter. This vaccine will not treat an infection and will not cause infection. This vaccine is recommended for infants and young children, adults with certain medical conditions, and adults 63 years or older. This medicine may be used for other purposes; ask your health care provider or pharmacist if you have questions. COMMON BRAND NAME(S): Prevnar, Prevnar 13 What should I tell my health care provider before I take this medicine? They need to know if you have any of these conditions: -bleeding problems -fever -immune system problems -an unusual or allergic reaction to pneumococcal vaccine, diphtheria toxoid, other vaccines, latex, other medicines, foods, dyes, or preservatives -pregnant or trying to get pregnant -breast-feeding How should I use this medicine? This vaccine is for injection into a muscle. It is given by a health care professional. A copy of Vaccine Information Statements will be given before each vaccination. Read this sheet carefully each time. The sheet may change frequently. Talk to your pediatrician regarding the use of this medicine in children. While this drug may be prescribed for children as young as 69 weeks old for selected conditions, precautions do apply. Overdosage: If you think you have taken too much of this medicine contact a poison control center or emergency room at once. NOTE: This medicine is only for you. Do not share this medicine with others. What if I miss a dose? It is important not to miss your dose. Call your doctor or health care professional  if you are unable to keep an appointment. What may interact with this medicine? -medicines for cancer chemotherapy -medicines that suppress your immune function -steroid medicines like prednisone or cortisone This list may not describe all possible interactions. Give your health care provider a list of all the medicines, herbs, non-prescription drugs, or dietary supplements you use. Also tell them if you smoke, drink alcohol, or use illegal drugs. Some items may interact with your medicine. What should I watch for while using this medicine? Mild fever and pain should go away in 3 days or less. Report any unusual symptoms to your doctor or health care professional. What side effects may I notice from receiving this medicine? Side effects that you should report to your doctor or health care professional as soon as possible: -allergic reactions like skin rash, itching or hives, swelling of the face, lips, or tongue -breathing problems -confused -fast or irregular heartbeat -fever over 102 degrees F -seizures -unusual bleeding or bruising -unusual muscle weakness Side effects that usually do not require medical attention (report to your doctor or health care professional if they continue or are bothersome): -aches and pains -diarrhea -fever of 102 degrees F or less -headache -irritable -loss of appetite -pain, tender at site where injected -trouble sleeping This list may not describe all possible side effects. Call your doctor for medical advice about side effects. You may report side effects to FDA at 1-800-FDA-1088. Where should I keep my medicine? This does not apply. This vaccine is given in a clinic, pharmacy, doctor's office, or other health care setting and will not be stored at home. NOTE: This sheet is a summary. It may not cover all possible information.  If you have questions about this medicine, talk to your doctor, pharmacist, or health care provider.  2018 Elsevier/Gold  Standard (2014-05-20 10:27:27) Chronic Obstructive Pulmonary Disease Chronic obstructive pulmonary disease (COPD) is a long-term (chronic) lung problem. When you have COPD, it is hard for air to get in and out of your lungs. The way your lungs work will never return to normal. Usually the condition gets worse over time. There are things you can do to keep yourself as healthy as possible. Your doctor may treat your condition with:  Medicines.  Quitting smoking, if you smoke.  Rehabilitation. This may involve a team of specialists.  Oxygen.  Exercise and changes to your diet.  Lung surgery.  Comfort measures (palliative care).  Follow these instructions at home: Medicines  Take over-the-counter and prescription medicines only as told by your doctor.  Talk to your doctor before taking any cough or allergy medicines. You may need to avoid medicines that cause your lungs to be dry. Lifestyle  If you smoke, stop. Smoking makes the problem worse. If you need help quitting, ask your doctor.  Avoid being around things that make your breathing worse. This may include smoke, chemicals, and fumes.  Stay active, but remember to also rest.  Learn and use tips on how to relax.  Make sure you get enough sleep. Most adults need at least 7 hours a night.  Eat healthy foods. Eat smaller meals more often. Rest before meals. Controlled breathing  Learn and use tips on how to control your breathing as told by your doctor. Try: ? Breathing in (inhaling) through your nose for 1 second. Then, pucker your lips and breath out (exhale) through your lips for 2 seconds. ? Putting one hand on your belly (abdomen). Breathe in slowly through your nose for 1 second. Your hand on your belly should move out. Pucker your lips and breathe out slowly through your lips. Your hand on your belly should move in as you breathe out. Controlled coughing  Learn and use controlled coughing to clear mucus from your  lungs. The steps are: 1. Lean your head a little forward. 2. Breathe in deeply. 3. Try to hold your breath for 3 seconds. 4. Keep your mouth slightly open while coughing 2 times. 5. Spit any mucus out into a tissue. 6. Rest and do the steps again 1 or 2 times as needed. General instructions  Make sure you get all the shots (vaccines) that your doctor recommends. Ask your doctor about a flu shot and a pneumonia shot.  Use oxygen therapy and therapy to help improve your lungs (pulmonary rehabilitation) if told by your doctor. If you need home oxygen therapy, ask your doctor if you should buy a tool to measure your oxygen level (oximeter).  Make a COPD action plan with your doctor. This helps you know what to do if you feel worse than usual.  Manage any other conditions you have as told by your doctor.  Avoid going outside when it is very hot, cold, or humid.  Avoid people who have a sickness you can catch (contagious).  Keep all follow-up visits as told by your doctor. This is important. Contact a doctor if:  You cough up more mucus than usual.  There is a change in the color or thickness of the mucus.  It is harder to breathe than usual.  Your breathing is faster than usual.  You have trouble sleeping.  You need to use your medicines more often than  usual.  You have trouble doing your normal activities such as getting dressed or walking around the house. Get help right away if:  You have shortness of breath while resting.  You have shortness of breath that stops you from: ? Being able to talk. ? Doing normal activities.  Your chest hurts for longer than 5 minutes.  Your skin color is more blue than usual.  Your pulse oximeter shows that you have low oxygen for longer than 5 minutes.  You have a fever.  You feel too tired to breathe normally. Summary  Chronic obstructive pulmonary disease (COPD) is a long-term lung problem.  The way your lungs work will never  return to normal. Usually the condition gets worse over time. There are things you can do to keep yourself as healthy as possible.  Take over-the-counter and prescription medicines only as told by your doctor.  If you smoke, stop. Smoking makes the problem worse. This information is not intended to replace advice given to you by your health care provider. Make sure you discuss any questions you have with your health care provider. Document Released: 01/30/2008 Document Revised: 01/19/2016 Document Reviewed: 04/09/2013 Elsevier Interactive Patient Education  2017 [employer].

## 2017-12-03 NOTE — Progress Notes (Signed)
[facility] [place], [place] [address]  Pulmonary Sleep Medicine   Office Visit Note  Patient Name: Wendy Garza DOB: 09/15/1945 MRN [id number]  Date of Service: 12/03/2017  Complaints/HPI:  Patient is here for follow Will of shortness breath COPD.  She states he continues to smoke unfortunately.  Once again discussed with her at length the importance of smoking cessation.  Pulmonary function was done this was reviewed with in detail.  % has no cough congestion noted.  She is complaining about symptoms fine has not noted any frank blood in urine at this time she denies any fevers or chills.  ROS  General: (-) fever, (-) chills, (-) night sweats, (-) weakness Skin: (-) rashes, (-) itching,. Eyes: (-) visual changes, (-) redness, (-) itching. Nose and Sinuses: (-) nasal stuffiness or itchiness, (-) postnasal drip, (-) nosebleeds, (-) sinus trouble. Mouth and Throat: (-) sore throat, (-) hoarseness. Neck: (-) swollen glands, (-) enlarged thyroid, (-) neck pain. Respiratory: - cough, (-) bloody sputum, + shortness of breath, - wheezing. Cardiovascular: - ankle swelling, (-) chest pain. Lymphatic: (-) lymph node enlargement. Neurologic: (-) numbness, (-) tingling. Psychiatric: (-) anxiety, (-) depression   Current Medication: Outpatient Encounter Medications as of 12/03/2017  Medication Sig Note  . albuterol (PROVENTIL HFA;VENTOLIN HFA) 108 (90 Base) MCG/ACT inhaler Inhale 2 puffs into the lungs every 6 (six) hours as needed for wheezing or shortness of breath. Reported on 11/10/2015   . budesonide-formoterol (SYMBICORT) 80-4.5 MCG/ACT inhaler Inhale into the lungs 2 (two) times daily.   . Calcium Carbonate 500 (200 Ca) MG WAFR Take by mouth daily.   . fluticasone (FLONASE) 50 MCG/ACT nasal spray Place into both nostrils daily. Reported on 11/10/2015   . metoprolol (LOPRESSOR) 50 MG tablet Take 25 mg by mouth 2 (two) times daily.   . rosuvastatin (CRESTOR) 20  MG tablet Take 20 mg by mouth daily.   . sertraline (ZOLOFT) 50 MG tablet Take 50 mg by mouth daily.   . Vitamin D, Ergocalciferol, (DRISDOL) 50000 units CAPS capsule TAKE ONE CAPSULE BY MOUTH ONCE WEEKLY FOR VITAMIN D DEFICIENCY 11/10/2015: Received from: External Pharmacy   No facility-administered encounter medications on file as of 12/03/2017.     Surgical History: Past Surgical History:  Procedure Laterality Date  . ABDOMINAL HYSTERECTOMY    . BREAST SURGERY    . CATARACT EXTRACTION W/PHACO Left 10/04/2015   Procedure: CATARACT EXTRACTION PHACO AND INTRAOCULAR LENS PLACEMENT (IOC);  Surgeon: Birder Robson, MD;  Location: [facility] ORS;  Service: Ophthalmology;  Laterality: Left;  [place]: 00:46.8 AP%: 21.7 CDE: 10.19 Lot # [id number]  . CATARACT EXTRACTION W/PHACO Right 10/25/2015   Procedure: CATARACT EXTRACTION PHACO AND INTRAOCULAR LENS PLACEMENT ([place]);  Surgeon: Birder Robson, MD;  Location: [facility] ORS;  Service: Ophthalmology;  Laterality: Right;  [place] 00:48 AP% 25.2 CDE 12.16 fluid pack lot # [id number]  . COLONOSCOPY WITH PROPOFOL N/A 05/28/2016   Procedure: COLONOSCOPY WITH PROPOFOL;  Surgeon: Manya Silvas, MD;  Location: [facility] ENDOSCOPY;  Service: Endoscopy;  Laterality: N/A;    Medical History: Past Medical History:  Diagnosis Date  . Asthma   . Cancer [facility]) 2003   UTERINE, total Hysterectomy  . COPD (chronic obstructive pulmonary disease) ([place])   . Depression   . Hyperlipidemia   . Hypertension   . Shortness of breath dyspnea    DOE    Family History: Family History  Problem Relation Age of Onset  . Kidney disease Mother   .  Kidney cancer Neg Hx   . Prostate cancer Neg Hx     Social History: Social History   Socioeconomic History  . Marital status: Single    Spouse name: Not on file  . Number of children: Not on file  . Years of education: Not on file  . Highest education level: Not on file  Occupational History  . Not on file  Social Needs  . Financial  resource strain: Not on file  . Food insecurity:    Worry: Not on file    Inability: Not on file  . Transportation needs:    Medical: Not on file    Non-medical: Not on file  Tobacco Use  . Smoking status: Current Every Day Smoker    Packs/day: 1.00    Years: 25.00    Pack years: 25.00    Types: Cigarettes  . Smokeless tobacco: Never Used  Substance and Sexual Activity  . Alcohol use: No    Alcohol/week: 0.0 oz  . Drug use: No  . Sexual activity: Not on file  Lifestyle  . Physical activity:    Days per week: Not on file    Minutes per session: Not on file  . Stress: Not on file  Relationships  . Social connections:    Talks on phone: Not on file    Gets together: Not on file    Attends religious service: Not on file    Active member of club or organization: Not on file    Attends meetings of clubs or organizations: Not on file    Relationship status: Not on file  . Intimate partner violence:    Fear of current or ex partner: Not on file    Emotionally abused: Not on file    Physically abused: Not on file    Forced sexual activity: Not on file  Other Topics Concern  . Not on file  Social History Narrative  . Not on file    Vital Signs: Blood pressure 122/60, pulse 92, resp. rate 16, height 5\' 4"  (1.626 m), weight 163 lb 6.4 oz (74.1 kg), SpO2 94 %.  Examination: General Appearance: The patient is well-developed, well-nourished, and in no distress. Skin: Gross inspection of skin unremarkable. Head: normocephalic, no gross deformities. Eyes: no gross deformities noted. ENT: ears appear grossly normal no exudates. Neck: Supple. No thyromegaly. No LAD. Respiratory: no rhonchi. Cardiovascular: Normal S1 and S2 without murmur or rub. Extremities: No cyanosis. pulses are equal. Neurologic: Alert and oriented. No involuntary movements.  LABS: Recent Results (from the past 2160 hour(s))  CBC with Differential/Platelet     Status: Abnormal   Collection Time: 10/15/17   7:23 AM  Result Value Ref Range   WBC 8.9 3.4 - 10.8 x10E3/uL   RBC 4.40 3.77 - 5.28 x10E6/uL   Hemoglobin 14.4 11.1 - 15.9 g/dL   Hematocrit 42.4 34.0 - 46.6 %   MCV 96 79 - 97 fL   MCH 32.7 26.6 - 33.0 pg   MCHC 34.0 31.5 - 35.7 g/dL   RDW 13.6 12.3 - 15.4 %   Platelets 289 150 - 379 x10E3/uL   Neutrophils 62 Not Estab. %   Lymphs 23 Not Estab. %   Monocytes 11 Not Estab. %   Eos 3 Not Estab. %   Basos 1 Not Estab. %   Neutrophils Absolute 5.6 1.4 - 7.0 x10E3/uL   Lymphocytes Absolute 2.0 0.7 - 3.1 x10E3/uL   Monocytes Absolute 1.0 (H) 0.1 - 0.9 x10E3/uL  EOS (ABSOLUTE) 0.2 0.0 - 0.4 x10E3/uL   Basophils Absolute 0.0 0.0 - 0.2 x10E3/uL   Immature Granulocytes 0 Not Estab. %   Immature Grans (Abs) 0.0 0.0 - 0.1 x10E3/uL  Comprehensive metabolic panel     Status: Abnormal   Collection Time: 10/15/17  7:23 AM  Result Value Ref Range   Glucose 109 (H) 65 - 99 mg/dL   BUN 16 8 - 27 mg/dL   Creatinine, Ser 0.89 0.57 - 1.00 mg/dL   GFR calc non Af Amer 66 >59 mL/min/1.73   GFR calc Af Amer 76 >59 mL/min/1.73   BUN/Creatinine Ratio 18 12 - 28   Sodium 142 134 - 144 mmol/L   Potassium 4.5 3.5 - 5.2 mmol/L   Chloride 106 96 - 106 mmol/L   CO2 21 20 - 29 mmol/L   Calcium 9.4 8.7 - 10.3 mg/dL   Total Protein 6.9 6.0 - 8.5 g/dL   Albumin 4.4 3.6 - 4.8 g/dL   Globulin, Total 2.5 1.5 - 4.5 g/dL   Albumin/Globulin Ratio 1.8 1.2 - 2.2   Bilirubin Total 0.3 0.0 - 1.2 mg/dL   Alkaline Phosphatase 62 39 - 117 IU/L   AST 21 0 - 40 IU/L   ALT 18 0 - 32 IU/L  T4, free     Status: None   Collection Time: 10/15/17  7:23 AM  Result Value Ref Range   Free T4 0.92 0.82 - 1.77 ng/dL  TSH     Status: None   Collection Time: 10/15/17  7:23 AM  Result Value Ref Range   TSH 3.920 0.450 - 4.500 uIU/mL  Lipid panel     Status: Abnormal   Collection Time: 10/15/17  7:23 AM  Result Value Ref Range   Cholesterol, Total 108 100 - 199 mg/dL   Triglycerides 130 0 - 149 mg/dL   HDL 29 (L) >39  mg/dL   VLDL Cholesterol Cal 26 5 - 40 mg/dL   LDL Calculated 53 0 - 99 mg/dL   Chol/HDL Ratio 3.7 0.0 - 4.4 ratio    Comment:                                   T. Chol/HDL Ratio                                             Men  Women                               1/2 Avg.Risk  3.4    3.3                                   Avg.Risk  5.0    4.4                                2X Avg.Risk  9.6    7.1                                3X Avg.Risk 23.4   11.0   Vitamin D  1,25 dihydroxy     Status: None   Collection Time: 10/15/17  7:23 AM  Result Value Ref Range   Vitamin D 1, 25 (OH)2 Total 47 pg/mL    Comment: Reference Range: Adults: 21 - 65    Vitamin D2 1, 25 (OH)2 35 pg/mL   Vitamin D3 1, 25 (OH)2 12 pg/mL    Radiology: No results found.  No results found.  No results found.    Assessment and Plan: Patient Active Problem List   Diagnosis Date Noted  . Mild intermittent asthma without complication 23/53/6144  . Vitamin D deficiency 10/17/2017  . Mixed hyperlipidemia 10/08/2017  . Hypertension 10/08/2017  . Generalized anxiety disorder 10/08/2017  . SUI (stress urinary incontinence, female) 11/12/2015  . Microscopic hematuria 11/12/2015  . Urge incontinence 11/12/2015    1. COPD  Will continue present as ordered.  She has been  Doing well overallshe continues to smoke once again suggested that she needs to stop smoking 2. Nicotine abuse smoking cessation counseling provided to her once again 3. UTI prescription for Bactrim was given at this time  General Counseling: I have discussed the findings of the evaluation and examination with Wendy Garza.  I have also discussed any further diagnostic evaluation thatmay be needed or ordered today. Wendy Garza verbalizes understanding of the findings of todays visit. We also reviewed her medications today and discussed drug interactions and side effects including but not limited excessive drowsiness and altered mental states. We also discussed  that there is always a risk not just to her but also people around her. she has been encouraged to call the office with any questions or concerns that should arise related to todays visit.    Time spent: 21min  I have personally obtained a history, examined the patient, evaluated laboratory and imaging results, formulated the assessment and plan and placed orders.    Allyne Gee, MD [facility] Pulmonary and Critical Care Sleep medicine

## 2017-12-04 ENCOUNTER — Other Ambulatory Visit: Payer: Self-pay

## 2017-12-04 ENCOUNTER — Other Ambulatory Visit: Payer: Self-pay | Admitting: Nurse Practitioner

## 2017-12-04 DIAGNOSIS — Z23 Encounter for immunization: Secondary | ICD-10-CM

## 2017-12-04 MED ORDER — PNEUMOCOCCAL VAC POLYVALENT 25 MCG/0.5ML IJ INJ: 0.5000 mL | INJECTION | Freq: Once | INTRAMUSCULAR | 0 refills | Status: AC

## 2017-12-04 NOTE — Progress Notes (Signed)
Sent order for pneumovax to [facility] [address] [address]

## 2017-12-06 LAB — CULTURE, URINE COMPREHENSIVE

## 2017-12-07 ENCOUNTER — Other Ambulatory Visit: Payer: Self-pay | Admitting: Internal Medicine

## 2017-12-17 ENCOUNTER — Other Ambulatory Visit: Payer: Self-pay | Admitting: Internal Medicine

## 2017-12-20 DIAGNOSIS — Z1231 Encounter for screening mammogram for malignant neoplasm of breast: Secondary | ICD-10-CM | POA: Diagnosis not present

## 2017-12-27 ENCOUNTER — Other Ambulatory Visit: Payer: Self-pay | Admitting: Nurse Practitioner

## 2017-12-27 ENCOUNTER — Telehealth: Payer: Self-pay

## 2017-12-27 DIAGNOSIS — J0141 Acute recurrent pansinusitis: Secondary | ICD-10-CM

## 2017-12-27 MED ORDER — AZITHROMYCIN 250 MG PO TABS: ORAL_TABLET | ORAL | 1 refills | Status: DC

## 2017-12-27 NOTE — Progress Notes (Signed)
Patient called c/o sinusitis. Sent in z-pack to her pharmacy. Take as directed. Use OTC medication as needed to alleviate symptoms. There is a refill on z-pack If symptoms persist, she may repeat this one time.

## 2017-12-27 NOTE — Telephone Encounter (Signed)
Advised pt that rx for zpak sent to pharmacy and gave other instructions per heather. dbs

## 2017-12-27 NOTE — Telephone Encounter (Signed)
Patient called c/o sinusitis. Sent in z-pack to her pharmacy. Take as directed. Use OTC medication as needed to alleviate symptoms. There is a refill on z-pack If symptoms persist, she may repeat this one time.

## 2018-01-28 ENCOUNTER — Encounter: Payer: Self-pay | Admitting: Podiatry

## 2018-01-28 ENCOUNTER — Encounter

## 2018-01-28 ENCOUNTER — Ambulatory Visit (INDEPENDENT_AMBULATORY_CARE_PROVIDER_SITE_OTHER): Payer: PPO

## 2018-01-28 ENCOUNTER — Ambulatory Visit: Payer: PPO | Admitting: Podiatry

## 2018-01-28 DIAGNOSIS — M722 Plantar fascial fibromatosis: Secondary | ICD-10-CM | POA: Diagnosis not present

## 2018-01-30 NOTE — Progress Notes (Signed)
   Subjective: Patient presents today for intermittent pain and tenderness in the right foot that began 3 weeks ago. She states the pain is worse when she first gets out of bed in the morning. Walking and standing for long periods of time increases the pain. She has not done anything for treatment. Patient presents today for further treatment and evaluation.  Past Medical History:  Diagnosis Date  . Asthma   . Cancer [facility]) 2003   UTERINE, total Hysterectomy  . COPD (chronic obstructive pulmonary disease) ([place])   . Depression   . Hyperlipidemia   . Hypertension   . Shortness of breath dyspnea    DOE     Objective: Physical Exam General: The patient is alert and oriented x3 in no acute distress.  Dermatology: Skin is warm, dry and supple bilateral lower extremities. Negative for open lesions or macerations bilateral.   Vascular: Dorsalis Pedis and Posterior Tibial pulses palpable bilateral.  Capillary fill time is immediate to all digits.  Neurological: Epicritic and protective threshold intact bilateral.   Musculoskeletal: Tenderness to palpation to the plantar aspect of the right heel along the plantar fascia. All other joints range of motion within normal limits bilateral. Strength 5/5 in all groups bilateral.   Radiographic exam: Normal osseous mineralization. Joint spaces preserved. No fracture/dislocation/boney destruction. No other soft tissue abnormalities or radiopaque foreign bodies.   Assessment: 1. Plantar fasciitis right 2. Pain in right foot  Plan of Care:  1. Patient evaluated. Xrays reviewed.   2. Injection of 0.5cc Celestone soluspan injected into the right plantar fascia  3. Rx for Meloxicam provided to patient.  4. Plantar fascial band(s) dispensed 5. Instructed patient regarding therapies and modalities at home to alleviate symptoms.  6. Return to clinic in 4 weeks.     Edrick Kins, DPM [facility]  Dr. Edrick Kins, DPM    [address],  [address]                Office [phone]  Fax [phone]

## 2018-01-31 ENCOUNTER — Telehealth: Payer: Self-pay | Admitting: Podiatry

## 2018-01-31 MED ORDER — MELOXICAM 15 MG PO TABS: 15.0000 mg | ORAL_TABLET | Freq: Every day | ORAL | 3 refills | Status: DC

## 2018-01-31 NOTE — Telephone Encounter (Signed)
Patient was suppose to have a anti- inflammatory medication sent to her pharmacy. If you can please call patient back and [facility] the [facility] [address]. Her number is [phone]

## 2018-01-31 NOTE — Telephone Encounter (Signed)
Medication has been sent to pharmacy.  °

## 2018-02-07 DIAGNOSIS — M25562 Pain in left knee: Secondary | ICD-10-CM | POA: Diagnosis not present

## 2018-02-07 DIAGNOSIS — M1712 Unilateral primary osteoarthritis, left knee: Secondary | ICD-10-CM | POA: Diagnosis not present

## 2018-02-19 ENCOUNTER — Other Ambulatory Visit: Payer: Self-pay | Admitting: Internal Medicine

## 2018-02-25 ENCOUNTER — Ambulatory Visit: Payer: PPO | Admitting: Podiatry

## 2018-03-10 ENCOUNTER — Other Ambulatory Visit: Payer: Self-pay | Admitting: Internal Medicine

## 2018-03-11 ENCOUNTER — Ambulatory Visit: Payer: PPO | Admitting: Podiatry

## 2018-04-07 ENCOUNTER — Other Ambulatory Visit: Payer: Self-pay | Admitting: Internal Medicine

## 2018-04-21 DIAGNOSIS — L821 Other seborrheic keratosis: Secondary | ICD-10-CM | POA: Diagnosis not present

## 2018-04-21 DIAGNOSIS — L578 Other skin changes due to chronic exposure to nonionizing radiation: Secondary | ICD-10-CM | POA: Diagnosis not present

## 2018-04-21 DIAGNOSIS — D229 Melanocytic nevi, unspecified: Secondary | ICD-10-CM | POA: Diagnosis not present

## 2018-04-21 DIAGNOSIS — L814 Other melanin hyperpigmentation: Secondary | ICD-10-CM | POA: Diagnosis not present

## 2018-05-13 ENCOUNTER — Encounter: Payer: Self-pay | Admitting: Nurse Practitioner

## 2018-05-13 ENCOUNTER — Ambulatory Visit (INDEPENDENT_AMBULATORY_CARE_PROVIDER_SITE_OTHER): Payer: PPO | Admitting: Nurse Practitioner

## 2018-05-13 VITALS — BP 132/68 | HR 87 | Resp 16 | Ht 64.0 in | Wt 160.2 lb

## 2018-05-13 DIAGNOSIS — E559 Vitamin D deficiency, unspecified: Secondary | ICD-10-CM | POA: Diagnosis not present

## 2018-05-13 DIAGNOSIS — E782 Mixed hyperlipidemia: Secondary | ICD-10-CM

## 2018-05-13 DIAGNOSIS — Z23 Encounter for immunization: Secondary | ICD-10-CM | POA: Diagnosis not present

## 2018-05-13 DIAGNOSIS — Z0001 Encounter for general adult medical examination with abnormal findings: Secondary | ICD-10-CM

## 2018-05-13 DIAGNOSIS — I10 Essential (primary) hypertension: Secondary | ICD-10-CM

## 2018-05-13 NOTE — Progress Notes (Signed)
[facility] [place], [place] [address]  Internal MEDICINE  Office Visit Note  Patient Name: Wendy Garza  [id number]  [id number]  Date of Service: 05/21/2018   Pt is here for routine health maintenance examination  Chief Complaint  Patient presents with  . Hypertension  . Hyperlipidemia  . COPD  . Depression  . Asthma  . Annual Exam     Hypertension  This is a chronic problem. The current episode started more than 1 year ago. The problem is unchanged. The problem is controlled. Pertinent negatives include no chest pain, headaches, neck pain, palpitations or shortness of breath. There are no associated agents to hypertension. Risk factors for coronary artery disease include post-menopausal state and dyslipidemia. Past treatments include beta blockers. The current treatment provides moderate improvement. There are no compliance problems.      Current Medication: Outpatient Encounter Medications as of 05/13/2018  Medication Sig  . albuterol (PROVENTIL HFA;VENTOLIN HFA) 108 (90 Base) MCG/ACT inhaler Inhale 2 puffs into the lungs every 6 (six) hours as needed for wheezing or shortness of breath. Reported on 11/10/2015  . budesonide-formoterol (SYMBICORT) 80-4.5 MCG/ACT inhaler Inhale into the lungs 2 (two) times daily.  . fluticasone (FLONASE) 50 MCG/ACT nasal spray Place into both nostrils daily. Reported on 11/10/2015  . meloxicam (MOBIC) 15 MG tablet Take 1 tablet (15 mg total) by mouth daily.  . metoprolol tartrate (LOPRESSOR) 50 MG tablet TAKE 1/2 OF A TABLET BY MOUTH TWICE A DAY  . Polyethylene Glycol 3350 (PEG 3350) POWD Take as directed for colonoscopy prep.  . rosuvastatin (CRESTOR) 20 MG tablet TAKE 1 TABLET BY MOUTH DAILY FOR HIGH CHOLESTEROL  . sertraline (ZOLOFT) 50 MG tablet TAKE 1 TABLET BY MOUTH EVERY DAY  . SYMBICORT 160-4.5 MCG/ACT inhaler TAKE 2 PUFFS BY MOUTH TWICE A DAY   No facility-administered encounter medications on file as of  05/13/2018.     Surgical History: Past Surgical History:  Procedure Laterality Date  . ABDOMINAL HYSTERECTOMY    . BREAST SURGERY    . CATARACT EXTRACTION W/PHACO Left 10/04/2015   Procedure: CATARACT EXTRACTION PHACO AND INTRAOCULAR LENS PLACEMENT (IOC);  Surgeon: Birder Robson, MD;  Location: [facility] ORS;  Service: Ophthalmology;  Laterality: Left;  [place]: 00:46.8 AP%: 21.7 CDE: 10.19 Lot # [id number]  . CATARACT EXTRACTION W/PHACO Right 10/25/2015   Procedure: CATARACT EXTRACTION PHACO AND INTRAOCULAR LENS PLACEMENT ([place]);  Surgeon: Birder Robson, MD;  Location: [facility] ORS;  Service: Ophthalmology;  Laterality: Right;  [place] 00:48 AP% 25.2 CDE 12.16 fluid pack lot # [id number]  . COLONOSCOPY WITH PROPOFOL N/A 05/28/2016   Procedure: COLONOSCOPY WITH PROPOFOL;  Surgeon: Manya Silvas, MD;  Location: [facility] ENDOSCOPY;  Service: Endoscopy;  Laterality: N/A;    Medical History: Past Medical History:  Diagnosis Date  . Asthma   . Cancer [facility]) 2003   UTERINE, total Hysterectomy  . COPD (chronic obstructive pulmonary disease) ([place])   . Depression   . Hyperlipidemia   . Hypertension   . Shortness of breath dyspnea    DOE    Family History: Family History  Problem Relation Age of Onset  . Kidney disease Mother   . Kidney cancer Neg Hx   . Prostate cancer Neg Hx       Review of Systems  Constitutional: Negative for activity change, chills, fatigue and unexpected weight change.  HENT: Negative for congestion, postnasal drip, rhinorrhea, sneezing and sore throat.   Eyes: Negative.  Negative for redness.  Respiratory:  Negative for cough, chest tightness, shortness of breath and wheezing.   Cardiovascular: Negative for chest pain and palpitations.  Gastrointestinal: Negative for abdominal pain, constipation, diarrhea, nausea and vomiting.  Endocrine: Negative for cold intolerance, heat intolerance, polydipsia, polyphagia and polyuria.  Genitourinary: Negative.  Negative for dysuria and  frequency.  Musculoskeletal: Negative for arthralgias, back pain, joint swelling and neck pain.  Skin: Negative for rash.  Allergic/Immunologic: Positive for environmental allergies.  Neurological: Negative for tremors, numbness and headaches.  Hematological: Negative for adenopathy. Does not bruise/bleed easily.  Psychiatric/Behavioral: Negative for behavioral problems (Depression), sleep disturbance and suicidal ideas. The patient is not nervous/anxious.      Today's Vitals   05/13/18 1453  BP: 132/68  Pulse: 87  Resp: 16  SpO2: 95%  Weight: 160 lb 3.2 oz (72.7 kg)  Height: 5\' 4"  (1.626 m)    Physical Exam  Constitutional: She is oriented to person, place, and time. She appears well-developed and well-nourished. No distress.  HENT:  Head: Normocephalic and atraumatic.  Nose: Nose normal.  Mouth/Throat: Oropharynx is clear and moist. No oropharyngeal exudate.  Eyes: Pupils are equal, round, and reactive to light. Conjunctivae and EOM are normal.  Neck: Normal range of motion. Neck supple. No JVD present. Carotid bruit is not present. No tracheal deviation present. No thyromegaly present.  Cardiovascular: Normal rate, regular rhythm, normal heart sounds and intact distal pulses. Exam reveals no gallop and no friction rub.  No murmur heard. Pulmonary/Chest: Effort normal and breath sounds normal. No respiratory distress. She has no wheezes. She has no rales. She exhibits no tenderness. Right breast exhibits no inverted nipple, no mass, no nipple discharge, no skin change and no tenderness. Left breast exhibits no inverted nipple, no mass, no nipple discharge, no skin change and no tenderness.  Abdominal: Soft. Bowel sounds are normal. There is no tenderness.  Musculoskeletal: Normal range of motion.  Lymphadenopathy:    She has no cervical adenopathy.  Neurological: She is alert and oriented to person, place, and time. No cranial nerve deficit.  Skin: Skin is warm and dry.  Capillary refill takes less than 2 seconds. She is not diaphoretic.  Psychiatric: She has a normal mood and affect. Her behavior is normal. Judgment and thought content normal.  Nursing note and vitals reviewed.  Depression screen [facility] 2/9 05/13/2018 12/03/2017 10/08/2017  Decreased Interest 0 0 0  Down, Depressed, Hopeless 0 0 0  PHQ - 2 Score 0 0 0    Functional Status Survey: Is the patient deaf or have difficulty hearing?: Yes Does the patient have difficulty seeing, even when wearing glasses/contacts?: No Does the patient have difficulty concentrating, remembering, or making decisions?: No Does the patient have difficulty walking or climbing stairs?: No Does the patient have difficulty dressing or bathing?: No Does the patient have difficulty doing errands alone such as visiting a doctor's office or shopping?: No  MMSE - [place] Exam 05/13/2018  Orientation to time 5  Orientation to Place 5  Registration 3  Attention/ Calculation 5  Recall 3  Language- name 2 objects 2  Language- repeat 1  Language- follow 3 step command 3  Language- read & follow direction 1  Write a sentence 1  Copy design 1  Total score 30    Fall Risk  05/13/2018 12/03/2017 10/08/2017  Falls in the past year? No No No     LABS: Recent Results (from the past 2160 hour(s))  UA/M w/rflx Culture, Routine  Status: Abnormal   Collection Time: 05/13/18  4:02 PM  Result Value Ref Range   Specific Gravity, UA 1.020 1.005 - 1.030   pH, UA 6.5 5.0 - 7.5   Color, UA Yellow Yellow   Appearance Ur Clear Clear   Leukocytes, UA Negative Negative   Protein, UA Negative Negative/Trace   Glucose, UA Negative Negative   Ketones, UA Negative Negative   RBC, UA 2+ (A) Negative   Bilirubin, UA Negative Negative   Urobilinogen, Ur 0.2 0.2 - 1.0 mg/dL   Nitrite, UA Negative Negative   Microscopic Examination See below:     Comment: Microscopic was indicated and was performed.   Urinalysis Reflex Comment      Comment: This specimen will not reflex to a Urine Culture.  Microscopic Examination     Status: Abnormal   Collection Time: 05/13/18  4:02 PM  Result Value Ref Range   WBC, UA 0-5 0 - 5 /hpf   RBC, UA 11-30 (A) 0 - 2 /hpf   Epithelial Cells (non renal) 0-10 0 - 10 /hpf   Casts Present (A) None seen /lpf   Cast Type Hyaline casts N/A   Mucus, UA Present Not Estab.   Bacteria, UA None seen None seen/Few    Assessment/Plan: 1. Encounter for general adult medical examination with abnormal findings Annual health maintenance exam today - UA/M w/rflx Culture, Routine - CBC with Differential/Platelet - T4, free - TSH  2. Essential hypertension Stable. Continue bp medications as prescribed.  - CBC with Differential/Platelet - Comprehensive metabolic panel  3. Mixed hyperlipidemia Fasting lipid panel ordered today.  - CBC with Differential/Platelet - Comprehensive metabolic panel - Lipid panel  4. Vitamin D deficiency - Vitamin D 1,25 dihydroxy  5. Flu vaccine need - Flu Vaccine MDCK QUAD PF  General Counseling: eliza green understanding of the findings of todays visit and agrees with plan of treatment. I have discussed any further diagnostic evaluation that may be needed or ordered today. We also reviewed her medications today. she has been encouraged to call the office with any questions or concerns that should arise related to todays visit.    Counseling:  Hypertension Counseling:   The following hypertensive lifestyle modification were recommended and discussed:  1. Limiting alcohol intake to less than 1 oz/day of ethanol:(24 oz of beer or 8 oz of wine or 2 oz of 100-proof whiskey). 2. Take baby ASA 81 mg daily. 3. Importance of regular aerobic exercise and losing weight. 4. Reduce dietary saturated fat and cholesterol intake for overall cardiovascular health. 5. Maintaining adequate dietary potassium, calcium, and magnesium intake. 6. Regular monitoring of the  blood pressure. 7. Reduce sodium intake to less than 100 mmol/day (less than 2.3 gm of sodium or less than 6 gm of sodium choride)   This patient was seen by [place] with Dr Lavera Guise as a part of collaborative care agreement  Orders Placed This Encounter  Procedures  . Microscopic Examination  . Flu Vaccine MDCK QUAD PF  . UA/M w/rflx Culture, Routine  . CBC with Differential/Platelet  . Comprehensive metabolic panel  . T4, free  . TSH  . Lipid panel  . Vitamin D 1,25 dihydroxy      Time spent: [place], MD  Internal Medicine

## 2018-05-14 LAB — MICROSCOPIC EXAMINATION: BACTERIA UA: NONE SEEN

## 2018-05-14 LAB — UA/M W/RFLX CULTURE, ROUTINE
Bilirubin, UA: NEGATIVE
Glucose, UA: NEGATIVE
Ketones, UA: NEGATIVE
LEUKOCYTES UA: NEGATIVE
Nitrite, UA: NEGATIVE
PH UA: 6.5 (ref 5.0–7.5)
PROTEIN UA: NEGATIVE
Specific Gravity, UA: 1.02 (ref 1.005–1.030)
UUROB: 0.2 mg/dL (ref 0.2–1.0)

## 2018-05-21 DIAGNOSIS — Z0001 Encounter for general adult medical examination with abnormal findings: Principal | ICD-10-CM

## 2018-05-21 DIAGNOSIS — Z1239 Encounter for other screening for malignant neoplasm of breast: Secondary | ICD-10-CM | POA: Insufficient documentation

## 2018-05-21 DIAGNOSIS — Z23 Encounter for immunization: Secondary | ICD-10-CM | POA: Insufficient documentation

## 2018-05-22 DIAGNOSIS — Z0001 Encounter for general adult medical examination with abnormal findings: Secondary | ICD-10-CM | POA: Diagnosis not present

## 2018-05-22 DIAGNOSIS — E782 Mixed hyperlipidemia: Secondary | ICD-10-CM | POA: Diagnosis not present

## 2018-05-22 DIAGNOSIS — I10 Essential (primary) hypertension: Secondary | ICD-10-CM | POA: Diagnosis not present

## 2018-05-22 DIAGNOSIS — E559 Vitamin D deficiency, unspecified: Secondary | ICD-10-CM | POA: Diagnosis not present

## 2018-05-28 LAB — TSH: TSH: 3.83 u[IU]/mL (ref 0.450–4.500)

## 2018-05-28 LAB — CBC WITH DIFFERENTIAL/PLATELET
Basophils Absolute: 0 10*3/uL (ref 0.0–0.2)
Basos: 1 %
EOS (ABSOLUTE): 0.4 10*3/uL (ref 0.0–0.4)
Eos: 5 %
HEMATOCRIT: 42.3 % (ref 34.0–46.6)
HEMOGLOBIN: 14 g/dL (ref 11.1–15.9)
Immature Grans (Abs): 0 10*3/uL (ref 0.0–0.1)
Immature Granulocytes: 0 %
LYMPHS ABS: 2.1 10*3/uL (ref 0.7–3.1)
Lymphs: 26 %
MCH: 32 pg (ref 26.6–33.0)
MCHC: 33.1 g/dL (ref 31.5–35.7)
MCV: 97 fL (ref 79–97)
Monocytes Absolute: 0.7 10*3/uL (ref 0.1–0.9)
Monocytes: 9 %
NEUTROS ABS: 4.9 10*3/uL (ref 1.4–7.0)
Neutrophils: 59 %
Platelets: 303 10*3/uL (ref 150–450)
RBC: 4.37 x10E6/uL (ref 3.77–5.28)
RDW: 13.6 % (ref 12.3–15.4)
WBC: 8.1 10*3/uL (ref 3.4–10.8)

## 2018-05-28 LAB — VITAMIN D 1,25 DIHYDROXY
VITAMIN D 1, 25 (OH) TOTAL: 30 pg/mL
VITAMIN D3 1, 25 (OH): 14 pg/mL
Vitamin D2 1, 25 (OH)2: 16 pg/mL

## 2018-05-28 LAB — COMPREHENSIVE METABOLIC PANEL
A/G RATIO: 1.8 (ref 1.2–2.2)
ALBUMIN: 4.2 g/dL (ref 3.6–4.8)
ALK PHOS: 62 IU/L (ref 39–117)
ALT: 14 IU/L (ref 0–32)
AST: 19 IU/L (ref 0–40)
BILIRUBIN TOTAL: 0.5 mg/dL (ref 0.0–1.2)
BUN / CREAT RATIO: 17 (ref 12–28)
BUN: 15 mg/dL (ref 8–27)
CHLORIDE: 104 mmol/L (ref 96–106)
CO2: 24 mmol/L (ref 20–29)
Calcium: 9.4 mg/dL (ref 8.7–10.3)
Creatinine, Ser: 0.86 mg/dL (ref 0.57–1.00)
GFR calc Af Amer: 80 mL/min/{1.73_m2} (ref >59)
GFR calc non Af Amer: 69 mL/min/{1.73_m2} (ref >59)
GLOBULIN, TOTAL: 2.3 g/dL (ref 1.5–4.5)
Glucose: 108 mg/dL — ABNORMAL HIGH (ref 65–99)
Potassium: 4.3 mmol/L (ref 3.5–5.2)
SODIUM: 142 mmol/L (ref 134–144)
Total Protein: 6.5 g/dL (ref 6.0–8.5)

## 2018-05-28 LAB — LIPID PANEL
CHOL/HDL RATIO: 4.4 ratio (ref 0.0–4.4)
Cholesterol, Total: 118 mg/dL (ref 100–199)
HDL: 27 mg/dL — ABNORMAL LOW (ref >39)
LDL Calculated: 56 mg/dL (ref 0–99)
Triglycerides: 177 mg/dL — ABNORMAL HIGH (ref 0–149)
VLDL Cholesterol Cal: 35 mg/dL (ref 5–40)

## 2018-05-28 LAB — T4, FREE: FREE T4: 0.96 ng/dL (ref 0.82–1.77)

## 2018-05-28 LAB — T3: T3, Total: 132 ng/dL (ref 71–180)

## 2018-06-02 ENCOUNTER — Other Ambulatory Visit: Payer: Self-pay

## 2018-06-02 MED ORDER — SERTRALINE HCL 50 MG PO TABS: 50.0000 mg | ORAL_TABLET | Freq: Every day | ORAL | 1 refills | Status: DC

## 2018-06-08 ENCOUNTER — Other Ambulatory Visit: Payer: Self-pay | Admitting: Internal Medicine

## 2018-08-13 ENCOUNTER — Other Ambulatory Visit: Payer: Self-pay

## 2018-08-13 MED ORDER — ROSUVASTATIN CALCIUM 20 MG PO TABS: ORAL_TABLET | ORAL | 1 refills | Status: DC

## 2018-09-04 ENCOUNTER — Other Ambulatory Visit: Payer: Self-pay | Admitting: Internal Medicine

## 2018-11-11 ENCOUNTER — Ambulatory Visit: Payer: Self-pay | Admitting: Nurse Practitioner

## 2018-11-24 ENCOUNTER — Other Ambulatory Visit: Payer: Self-pay

## 2018-11-24 MED ORDER — SERTRALINE HCL 50 MG PO TABS: 50.0000 mg | ORAL_TABLET | Freq: Every day | ORAL | 1 refills | Status: DC

## 2018-11-25 ENCOUNTER — Ambulatory Visit: Payer: Self-pay | Admitting: Internal Medicine

## 2018-12-01 ENCOUNTER — Other Ambulatory Visit: Payer: Self-pay

## 2018-12-01 MED ORDER — BUDESONIDE-FORMOTEROL FUMARATE 80-4.5 MCG/ACT IN AERO: 2.0000 | INHALATION_SPRAY | Freq: Two times a day (BID) | RESPIRATORY_TRACT | 1 refills | Status: DC

## 2018-12-02 ENCOUNTER — Ambulatory Visit: Payer: Self-pay | Admitting: Internal Medicine

## 2018-12-04 ENCOUNTER — Other Ambulatory Visit: Payer: Self-pay

## 2018-12-04 MED ORDER — BUDESONIDE-FORMOTEROL FUMARATE 80-4.5 MCG/ACT IN AERO: 2.0000 | INHALATION_SPRAY | Freq: Two times a day (BID) | RESPIRATORY_TRACT | 1 refills | Status: DC

## 2018-12-04 NOTE — Telephone Encounter (Signed)
Pt advised that we send pres just keep your follow up for next month

## 2019-01-02 ENCOUNTER — Other Ambulatory Visit: Payer: Self-pay

## 2019-01-02 MED ORDER — BUDESONIDE-FORMOTEROL FUMARATE 80-4.5 MCG/ACT IN AERO: 2.0000 | INHALATION_SPRAY | Freq: Two times a day (BID) | RESPIRATORY_TRACT | 1 refills | Status: DC

## 2019-01-16 ENCOUNTER — Encounter: Payer: Self-pay | Admitting: Nurse Practitioner

## 2019-01-16 ENCOUNTER — Ambulatory Visit (INDEPENDENT_AMBULATORY_CARE_PROVIDER_SITE_OTHER): Payer: PPO | Admitting: Nurse Practitioner

## 2019-01-16 ENCOUNTER — Other Ambulatory Visit: Payer: Self-pay

## 2019-01-16 VITALS — Resp 16 | Ht 64.0 in | Wt 160.0 lb

## 2019-01-16 DIAGNOSIS — Z1239 Encounter for other screening for malignant neoplasm of breast: Secondary | ICD-10-CM

## 2019-01-16 DIAGNOSIS — J4520 Mild intermittent asthma, uncomplicated: Secondary | ICD-10-CM

## 2019-01-16 DIAGNOSIS — I10 Essential (primary) hypertension: Secondary | ICD-10-CM

## 2019-01-16 NOTE — Progress Notes (Signed)
[facility] [place], [place] [address]  Internal MEDICINE  Telephone Visit  Patient Name: Wendy Garza  [id number]  [id number]  Date of Service: 01/31/2019  I connected with the patient at 11:08am by telephone and verified the patients identity using two identifiers.   I discussed the limitations, risks, security and privacy concerns of performing an evaluation and management service by telephone and the availability of in person appointments. I also discussed with the patient that there may be a patient responsible charge related to the service.  The patient expressed understanding and agrees to proceed.    Chief Complaint  Patient presents with  . Telephone Assessment  . Telephone Screen  . Hyperlipidemia  . Hypertension    The patient has been contacted via telephone for follow up visit due to concerns for spread of novel coronavirus. She has no concerns or complaints today. Her medications are all up to date at her pharmacy. She is due to have screening mammogram. She should have wellness visit in 04/2019.       Current Medication: Outpatient Encounter Medications as of 01/16/2019  Medication Sig  . albuterol (PROVENTIL HFA;VENTOLIN HFA) 108 (90 Base) MCG/ACT inhaler Inhale 2 puffs into the lungs every 6 (six) hours as needed for wheezing or shortness of breath. Reported on 11/10/2015  . budesonide-formoterol (SYMBICORT) 80-4.5 MCG/ACT inhaler Inhale 2 puffs into the lungs 2 (two) times daily.  . fluticasone (FLONASE) 50 MCG/ACT nasal spray Place into both nostrils daily. Reported on 11/10/2015  . meloxicam (MOBIC) 15 MG tablet Take 1 tablet (15 mg total) by mouth daily.  . metoprolol tartrate (LOPRESSOR) 50 MG tablet TAKE 1/2 OF A TABLET BY MOUTH TWICE A DAY  . Polyethylene Glycol 3350 (PEG 3350) POWD Take as directed for colonoscopy prep.  . rosuvastatin (CRESTOR) 20 MG tablet TAKE 1 TABLET BY MOUTH DAILY FOR HIGH CHOLESTEROL  . sertraline (ZOLOFT) 50  MG tablet Take 1 tablet (50 mg total) by mouth daily.  . SYMBICORT 160-4.5 MCG/ACT inhaler TAKE 2 PUFFS BY MOUTH TWICE A DAY   No facility-administered encounter medications on file as of 01/16/2019.     Surgical History: Past Surgical History:  Procedure Laterality Date  . ABDOMINAL HYSTERECTOMY    . BREAST SURGERY    . CATARACT EXTRACTION W/PHACO Left 10/04/2015   Procedure: CATARACT EXTRACTION PHACO AND INTRAOCULAR LENS PLACEMENT (IOC);  Surgeon: Birder Robson, MD;  Location: [facility] ORS;  Service: Ophthalmology;  Laterality: Left;  [place]: 00:46.8 AP%: 21.7 CDE: 10.19 Lot # [id number]  . CATARACT EXTRACTION W/PHACO Right 10/25/2015   Procedure: CATARACT EXTRACTION PHACO AND INTRAOCULAR LENS PLACEMENT ([place]);  Surgeon: Birder Robson, MD;  Location: [facility] ORS;  Service: Ophthalmology;  Laterality: Right;  [place] 00:48 AP% 25.2 CDE 12.16 fluid pack lot # [id number]  . COLONOSCOPY WITH PROPOFOL N/A 05/28/2016   Procedure: COLONOSCOPY WITH PROPOFOL;  Surgeon: Manya Silvas, MD;  Location: [facility] ENDOSCOPY;  Service: Endoscopy;  Laterality: N/A;    Medical History: Past Medical History:  Diagnosis Date  . Asthma   . Cancer [facility]) 2003   UTERINE, total Hysterectomy  . COPD (chronic obstructive pulmonary disease) ([place])   . Depression   . Hyperlipidemia   . Hypertension   . Shortness of breath dyspnea    DOE    Family History: Family History  Problem Relation Age of Onset  . Kidney disease Mother   . Kidney cancer Neg Hx   . Prostate cancer Neg Hx  Social History   Socioeconomic History  . Marital status: Single    Spouse name: Not on file  . Number of children: Not on file  . Years of education: Not on file  . Highest education level: Not on file  Occupational History  . Not on file  Social Needs  . Financial resource strain: Not on file  . Food insecurity:    Worry: Not on file    Inability: Not on file  . Transportation needs:    Medical: Not on file    Non-medical:  Not on file  Tobacco Use  . Smoking status: Current Every Day Smoker    Packs/day: 1.00    Years: 25.00    Pack years: 25.00    Types: Cigarettes  . Smokeless tobacco: Never Used  Substance and Sexual Activity  . Alcohol use: No    Alcohol/week: 0.0 standard drinks  . Drug use: No  . Sexual activity: Not on file  Lifestyle  . Physical activity:    Days per week: Not on file    Minutes per session: Not on file  . Stress: Not on file  Relationships  . Social connections:    Talks on phone: Not on file    Gets together: Not on file    Attends religious service: Not on file    Active member of club or organization: Not on file    Attends meetings of clubs or organizations: Not on file    Relationship status: Not on file  . Intimate partner violence:    Fear of current or ex partner: Not on file    Emotionally abused: Not on file    Physically abused: Not on file    Forced sexual activity: Not on file  Other Topics Concern  . Not on file  Social History Narrative  . Not on file      Review of Systems  Constitutional: Negative for activity change, chills, fatigue and unexpected weight change.  HENT: Negative for congestion, postnasal drip, rhinorrhea, sneezing and sore throat.   Respiratory: Negative for cough, chest tightness, shortness of breath and wheezing.   Cardiovascular: Negative for chest pain and palpitations.  Gastrointestinal: Negative for abdominal pain, constipation, diarrhea, nausea and vomiting.  Endocrine: Negative for cold intolerance, heat intolerance, polydipsia, polyphagia and polyuria.  Genitourinary: Negative.  Negative for dysuria and frequency.  Musculoskeletal: Negative for arthralgias, back pain, joint swelling and neck pain.  Skin: Negative for rash.  Allergic/Immunologic: Positive for environmental allergies.  Neurological: Negative for tremors, numbness and headaches.  Hematological: Negative for adenopathy. Does not bruise/bleed easily.   Psychiatric/Behavioral: Negative for behavioral problems (Depression), sleep disturbance and suicidal ideas. The patient is not nervous/anxious.     Today's Vitals   01/16/19 1038  Resp: 16  Weight: 160 lb (72.6 kg)  Height: 5\' 4"  (1.626 m)   Body mass index is 27.46 kg/m.  Observation/Objective:   The patient is alert and oriented. She is pleasant and answers all questions appropriately. Breathing is non-labored. She is in no acute distress at this time.    Assessment/Plan: 1. Essential hypertension Stable. Continue bp medication as prescribed   2. Mild intermittent asthma without complication Continue inhalers and respiratory medications as prescribed   3. Screening for breast cancer - MM DIGITAL SCREENING BILATERAL; Future  General Counseling: surie suchocki understanding of the findings of today's phone visit and agrees with plan of treatment. I have discussed any further diagnostic evaluation that may be needed or ordered  today. We also reviewed her medications today. she has been encouraged to call the office with any questions or concerns that should arise related to todays visit.  Hypertension Counseling:   The following hypertensive lifestyle modification were recommended and discussed:  1. Limiting alcohol intake to less than 1 oz/day of ethanol:(24 oz of beer or 8 oz of wine or 2 oz of 100-proof whiskey). 2. Take baby ASA 81 mg daily. 3. Importance of regular aerobic exercise and losing weight. 4. Reduce dietary saturated fat and cholesterol intake for overall cardiovascular health. 5. Maintaining adequate dietary potassium, calcium, and magnesium intake. 6. Regular monitoring of the blood pressure. 7. Reduce sodium intake to less than 100 mmol/day (less than 2.3 gm of sodium or less than 6 gm of sodium choride)   This patient was seen by [place] with Dr Lavera Guise as a part of collaborative care agreement  Orders Placed This  Encounter  Procedures  . MM DIGITAL SCREENING BILATERAL     Time spent: 45 Minutes    Dr Lavera Guise Internal medicine

## 2019-02-02 ENCOUNTER — Other Ambulatory Visit: Payer: Self-pay

## 2019-02-02 MED ORDER — BUDESONIDE-FORMOTEROL FUMARATE 80-4.5 MCG/ACT IN AERO: 2.0000 | INHALATION_SPRAY | Freq: Two times a day (BID) | RESPIRATORY_TRACT | 1 refills | Status: DC

## 2019-02-12 ENCOUNTER — Other Ambulatory Visit: Payer: Self-pay | Admitting: Nurse Practitioner

## 2019-02-12 MED ORDER — ROSUVASTATIN CALCIUM 20 MG PO TABS: ORAL_TABLET | ORAL | 1 refills | Status: DC

## 2019-02-19 ENCOUNTER — Encounter: Payer: Self-pay | Admitting: Internal Medicine

## 2019-02-19 ENCOUNTER — Ambulatory Visit: Payer: PPO | Admitting: Internal Medicine

## 2019-02-19 ENCOUNTER — Other Ambulatory Visit: Payer: Self-pay

## 2019-02-19 VITALS — BP 126/82 | HR 88 | Resp 16 | Ht 64.0 in | Wt 164.0 lb

## 2019-02-19 DIAGNOSIS — R0602 Shortness of breath: Secondary | ICD-10-CM

## 2019-02-19 DIAGNOSIS — J4520 Mild intermittent asthma, uncomplicated: Secondary | ICD-10-CM

## 2019-02-19 DIAGNOSIS — J449 Chronic obstructive pulmonary disease, unspecified: Secondary | ICD-10-CM

## 2019-02-19 DIAGNOSIS — I10 Essential (primary) hypertension: Secondary | ICD-10-CM | POA: Diagnosis not present

## 2019-02-19 MED ORDER — BUDESONIDE-FORMOTEROL FUMARATE 80-4.5 MCG/ACT IN AERO: 2.0000 | INHALATION_SPRAY | Freq: Two times a day (BID) | RESPIRATORY_TRACT | 11 refills | Status: DC

## 2019-02-19 NOTE — Progress Notes (Signed)
[facility] [place], [place] [address]  Pulmonary Sleep Medicine   Office Visit Note  Patient Name: Wendy Garza DOB: June 15, 1946 MRN [id number]  Date of Service: 02/19/2019  Complaints/HPI: Pt is here for one year follow up on copd.  She reports she has been overall doing well.  Denies any hospitalizations or illness in the last year.  She continues to use her Symbicort with go results. She denies chest pain, hemoptysis, sob or other complaints at this time.    ROS  General: (-) fever, (-) chills, (-) night sweats, (-) weakness Skin: (-) rashes, (-) itching,. Eyes: (-) visual changes, (-) redness, (-) itching. Nose and Sinuses: (-) nasal stuffiness or itchiness, (-) postnasal drip, (-) nosebleeds, (-) sinus trouble. Mouth and Throat: (-) sore throat, (-) hoarseness. Neck: (-) swollen glands, (-) enlarged thyroid, (-) neck pain. Respiratory: - cough, (-) bloody sputum, - shortness of breath, - wheezing. Cardiovascular: - ankle swelling, (-) chest pain. Lymphatic: (-) lymph node enlargement. Neurologic: (-) numbness, (-) tingling. Psychiatric: (-) anxiety, (-) depression   Current Medication: Outpatient Encounter Medications as of 02/19/2019  Medication Sig  . albuterol (PROVENTIL HFA;VENTOLIN HFA) 108 (90 Base) MCG/ACT inhaler Inhale 2 puffs into the lungs every 6 (six) hours as needed for wheezing or shortness of breath. Reported on 11/10/2015  . budesonide-formoterol (SYMBICORT) 80-4.5 MCG/ACT inhaler Inhale 2 puffs into the lungs 2 (two) times daily.  . fluticasone (FLONASE) 50 MCG/ACT nasal spray Place into both nostrils daily. Reported on 11/10/2015  . meloxicam (MOBIC) 15 MG tablet Take 1 tablet (15 mg total) by mouth daily.  . metoprolol tartrate (LOPRESSOR) 50 MG tablet TAKE 1/2 OF A TABLET BY MOUTH TWICE A DAY  . Polyethylene Glycol 3350 (PEG 3350) POWD Take as directed for colonoscopy prep.  . rosuvastatin (CRESTOR) 20 MG tablet TAKE 1 TABLET  BY MOUTH DAILY FOR HIGH CHOLESTEROL  . sertraline (ZOLOFT) 50 MG tablet Take 1 tablet (50 mg total) by mouth daily.  . SYMBICORT 160-4.5 MCG/ACT inhaler TAKE 2 PUFFS BY MOUTH TWICE A DAY   No facility-administered encounter medications on file as of 02/19/2019.     Surgical History: Past Surgical History:  Procedure Laterality Date  . ABDOMINAL HYSTERECTOMY    . BREAST SURGERY    . CATARACT EXTRACTION W/PHACO Left 10/04/2015   Procedure: CATARACT EXTRACTION PHACO AND INTRAOCULAR LENS PLACEMENT (IOC);  Surgeon: Birder Robson, MD;  Location: [facility] ORS;  Service: Ophthalmology;  Laterality: Left;  [place]: 00:46.8 AP%: 21.7 CDE: 10.19 Lot # [id number]  . CATARACT EXTRACTION W/PHACO Right 10/25/2015   Procedure: CATARACT EXTRACTION PHACO AND INTRAOCULAR LENS PLACEMENT ([place]);  Surgeon: Birder Robson, MD;  Location: [facility] ORS;  Service: Ophthalmology;  Laterality: Right;  [place] 00:48 AP% 25.2 CDE 12.16 fluid pack lot # [id number]  . COLONOSCOPY WITH PROPOFOL N/A 05/28/2016   Procedure: COLONOSCOPY WITH PROPOFOL;  Surgeon: Manya Silvas, MD;  Location: [facility] ENDOSCOPY;  Service: Endoscopy;  Laterality: N/A;    Medical History: Past Medical History:  Diagnosis Date  . Asthma   . Cancer [facility]) 2003   UTERINE, total Hysterectomy  . COPD (chronic obstructive pulmonary disease) ([place])   . Depression   . Hyperlipidemia   . Hypertension   . Shortness of breath dyspnea    DOE    Family History: Family History  Problem Relation Age of Onset  . Kidney disease Mother   . Kidney cancer Neg Hx   . Prostate cancer Neg Hx  Social History: Social History   Socioeconomic History  . Marital status: Single    Spouse name: Not on file  . Number of children: Not on file  . Years of education: Not on file  . Highest education level: Not on file  Occupational History  . Not on file  Social Needs  . Financial resource strain: Not on file  . Food insecurity    Worry: Not on file    Inability:  Not on file  . Transportation needs    Medical: Not on file    Non-medical: Not on file  Tobacco Use  . Smoking status: Current Every Day Smoker    Packs/day: 1.00    Years: 25.00    Pack years: 25.00    Types: Cigarettes  . Smokeless tobacco: Never Used  Substance and Sexual Activity  . Alcohol use: No    Alcohol/week: 0.0 standard drinks  . Drug use: No  . Sexual activity: Not on file  Lifestyle  . Physical activity    Days per week: Not on file    Minutes per session: Not on file  . Stress: Not on file  Relationships  . Social Herbalist on phone: Not on file    Gets together: Not on file    Attends religious service: Not on file    Active member of club or organization: Not on file    Attends meetings of clubs or organizations: Not on file    Relationship status: Not on file  . Intimate partner violence    Fear of current or ex partner: Not on file    Emotionally abused: Not on file    Physically abused: Not on file    Forced sexual activity: Not on file  Other Topics Concern  . Not on file  Social History Narrative  . Not on file    Vital Signs: Blood pressure 126/82, pulse 88, resp. rate 16, height 5\' 4"  (1.626 m), weight 164 lb (74.4 kg), SpO2 95 %.  Examination: General Appearance: The patient is well-developed, well-nourished, and in no distress. Skin: Gross inspection of skin unremarkable. Head: normocephalic, no gross deformities. Eyes: no gross deformities noted. ENT: ears appear grossly normal no exudates. Neck: Supple. No thyromegaly. No LAD. Respiratory: clear bilateraly. Cardiovascular: Normal S1 and S2 without murmur or rub. Extremities: No cyanosis. pulses are equal. Neurologic: Alert and oriented. No involuntary movements.  LABS: No results found for this or any previous visit (from the past 2160 hour(s)).  Radiology: No results found.  No results found.  No results found.    Assessment and Plan: Patient Active Problem  List   Diagnosis Date Noted  . Screening for breast cancer 05/21/2018  . Flu vaccine need 05/21/2018  . Obstructive chronic bronchitis without exacerbation ([place]) 12/03/2017  . Nicotine dependence, cigarettes, uncomplicated 58/52/7782  . Mild intermittent asthma without complication 42/35/3614  . Vitamin D deficiency 10/17/2017  . Mixed hyperlipidemia 10/08/2017  . Essential hypertension 10/08/2017  . Generalized anxiety disorder 10/08/2017  . SUI (stress urinary incontinence, female) 11/12/2015  . Microscopic hematuria 11/12/2015  . Urge incontinence 11/12/2015   1. Obstructive chronic bronchitis without exacerbation ([place]) Refilled patients Symbicort at this time. Will get PFTs, and follow up in a few weeks.   - Pulmonary Function Test; Future - budesonide-formoterol (SYMBICORT) 80-4.5 MCG/ACT inhaler; Inhale 2 puffs into the lungs 2 (two) times daily.  Dispense: 1 Inhaler; Refill: 11  2. Mild intermittent asthma without complication Stable, continue  present management.   3. Essential hypertension Well controlled, continue present management.  4. SOB (shortness of breath) - Spirometry with Graph   General Counseling: I have discussed the findings of the evaluation and examination with Karalee.  I have also discussed any further diagnostic evaluation thatmay be needed or ordered today. Eloni verbalizes understanding of the findings of todays visit. We also reviewed her medications today and discussed drug interactions and side effects including but not limited excessive drowsiness and altered mental states. We also discussed that there is always a risk not just to her but also people around her. she has been encouraged to call the office with any questions or concerns that should arise related to todays visit.    Time spent: 20  I have personally obtained a history, examined the patient, evaluated laboratory and imaging results, formulated the assessment and plan and placed  orders.    Allyne Gee, MD [facility] Pulmonary and Critical Care Sleep medicine

## 2019-02-19 NOTE — Patient Instructions (Signed)
Chronic Obstructive Pulmonary Disease Chronic obstructive pulmonary disease (COPD) is a long-term (chronic) lung problem. When you have COPD, it is hard for air to get in and out of your lungs. Usually the condition gets worse over time, and your lungs will never return to normal. There are things you can do to keep yourself as healthy as possible.  Your doctor may treat your condition with: ? Medicines. ? Oxygen. ? Lung surgery.  Your doctor may also recommend: ? Rehabilitation. This includes steps to make your body work better. It may involve a team of specialists. ? Quitting smoking, if you smoke. ? Exercise and changes to your diet. ? Comfort measures (palliative care). Follow these instructions at home: Medicines  Take over-the-counter and prescription medicines only as told by your doctor.  Talk to your doctor before taking any cough or allergy medicines. You may need to avoid medicines that cause your lungs to be dry. Lifestyle  If you smoke, stop. Smoking makes the problem worse. If you need help quitting, ask your doctor.  Avoid being around things that make your breathing worse. This may include smoke, chemicals, and fumes.  Stay active, but remember to rest as well.  Learn and use tips on how to relax.  Make sure you get enough sleep. Most adults need at least 7 hours of sleep every night.  Eat healthy foods. Eat smaller meals more often. Rest before meals. Controlled breathing Learn and use tips on how to control your breathing as told by your doctor. Try:  Breathing in (inhaling) through your nose for 1 second. Then, pucker your lips and breath out (exhale) through your lips for 2 seconds.  Putting one hand on your belly (abdomen). Breathe in slowly through your nose for 1 second. Your hand on your belly should move out. Pucker your lips and breathe out slowly through your lips. Your hand on your belly should move in as you breathe out.  Controlled coughing Learn  and use controlled coughing to clear mucus from your lungs. Follow these steps: 1. Lean your head a little forward. 2. Breathe in deeply. 3. Try to hold your breath for 3 seconds. 4. Keep your mouth slightly open while coughing 2 times. 5. Spit any mucus out into a tissue. 6. Rest and do the steps again 1 or 2 times as needed. General instructions  Make sure you get all the shots (vaccines) that your doctor recommends. Ask your doctor about a flu shot and a pneumonia shot.  Use oxygen therapy and pulmonary rehabilitation if told by your doctor. If you need home oxygen therapy, ask your doctor if you should buy a tool to measure your oxygen level (oximeter).  Make a COPD action plan with your doctor. This helps you to know what to do if you feel worse than usual.  Manage any other conditions you have as told by your doctor.  Avoid going outside when it is very hot, cold, or humid.  Avoid people who have a sickness you can catch (contagious).  Keep all follow-up visits as told by your doctor. This is important. Contact a doctor if:  You cough up more mucus than usual.  There is a change in the color or thickness of the mucus.  It is harder to breathe than usual.  Your breathing is faster than usual.  You have trouble sleeping.  You need to use your medicines more often than usual.  You have trouble doing your normal activities such as getting dressed   or walking around the house. Get help right away if:  You have shortness of breath while resting.  You have shortness of breath that stops you from: ? Being able to talk. ? Doing normal activities.  Your chest hurts for longer than 5 minutes.  Your skin color is more blue than usual.  Your pulse oximeter shows that you have low oxygen for longer than 5 minutes.  You have a fever.  You feel too tired to breathe normally. Summary  Chronic obstructive pulmonary disease (COPD) is a long-term lung problem.  The way your  lungs work will never return to normal. Usually the condition gets worse over time. There are things you can do to keep yourself as healthy as possible.  Take over-the-counter and prescription medicines only as told by your doctor.  If you smoke, stop. Smoking makes the problem worse. This information is not intended to replace advice given to you by your health care provider. Make sure you discuss any questions you have with your health care provider. Document Released: 01/30/2008 Document Revised: 09/17/2016 Document Reviewed: 09/17/2016 Elsevier Interactive Patient Education  2019 Elsevier Inc.  

## 2019-02-25 ENCOUNTER — Ambulatory Visit (INDEPENDENT_AMBULATORY_CARE_PROVIDER_SITE_OTHER): Payer: PPO | Admitting: Internal Medicine

## 2019-02-25 ENCOUNTER — Other Ambulatory Visit: Payer: Self-pay

## 2019-02-25 DIAGNOSIS — R0602 Shortness of breath: Secondary | ICD-10-CM | POA: Diagnosis not present

## 2019-02-25 LAB — PULMONARY FUNCTION TEST

## 2019-03-15 NOTE — Procedures (Signed)
[facility] [place], [address]  DATE OF SERVICE: February 25, 2019  Complete Pulmonary Function Testing Interpretation:  FINDINGS:  Forced vital capacity is mildly decreased.  The FEV1 is 1.42 L which is 65% of predicted and is mildly decreased.  Postbronchodilator there is no significant change in the FEV1 however clinical improvement may occur in the absence of spirometric improvement.  FEV1 FVC ratio is mildly decreased.  Total lung capacity is mildly decreased residual volume is mildly decreased residual volume total lung capacity ratio is decreased thoracic gas volume is decreased.  DLCO is mildly decreased.  IMPRESSION:  This pulmonary function study is suggestive of mild obstructive and mild restrictive lung disease there is a decrease in the DLCO clinical correlation is recommended  Allyne Gee, MD [facility] Pulmonary Critical Care Medicine Sleep Medicine

## 2019-03-16 ENCOUNTER — Ambulatory Visit: Payer: PPO | Admitting: Internal Medicine

## 2019-03-23 ENCOUNTER — Ambulatory Visit: Payer: PPO | Admitting: Internal Medicine

## 2019-03-23 ENCOUNTER — Other Ambulatory Visit: Payer: Self-pay

## 2019-03-23 ENCOUNTER — Encounter: Payer: Self-pay | Admitting: Internal Medicine

## 2019-03-23 VITALS — BP 128/82 | HR 91 | Resp 16 | Ht 64.0 in | Wt 162.0 lb

## 2019-03-23 DIAGNOSIS — R0602 Shortness of breath: Secondary | ICD-10-CM

## 2019-03-23 DIAGNOSIS — F17210 Nicotine dependence, cigarettes, uncomplicated: Secondary | ICD-10-CM | POA: Diagnosis not present

## 2019-03-23 DIAGNOSIS — J449 Chronic obstructive pulmonary disease, unspecified: Secondary | ICD-10-CM

## 2019-03-23 NOTE — Progress Notes (Signed)
[facility] [place], [place] [address]  Pulmonary Sleep Medicine   Office Visit Note  Patient Name: Wendy Garza DOB: 1946-07-23 MRN [id number]  Date of Service: 03/23/2019  Complaints/HPI: Overall she is doing well. She states that she has been pretty much staying indoors. She is using her mask outside. Patient states no exacerbation of her breathing. She is still smoking. 4-5 cig per day. She states that she has not been able to quit regardless of method has always relapsed. NO cough noted no congestion. No chest pain. She has no contact with anyone that has been sick. Denies having any fevrs or chills  ROS  General: (-) fever, (-) chills, (-) night sweats, (-) weakness Skin: (-) rashes, (-) itching,. Eyes: (-) visual changes, (-) redness, (-) itching. Nose and Sinuses: (-) nasal stuffiness or itchiness, (-) postnasal drip, (-) nosebleeds, (-) sinus trouble. Mouth and Throat: (-) sore throat, (-) hoarseness. Neck: (-) swollen glands, (-) enlarged thyroid, (-) neck pain. Respiratory: - cough, (-) bloody sputum, + shortness of breath, - wheezing. Cardiovascular: - ankle swelling, (-) chest pain. Lymphatic: (-) lymph node enlargement. Neurologic: (-) numbness, (-) tingling. Psychiatric: (-) anxiety, (-) depression   Current Medication: Outpatient Encounter Medications as of 03/23/2019  Medication Sig  . albuterol (PROVENTIL HFA;VENTOLIN HFA) 108 (90 Base) MCG/ACT inhaler Inhale 2 puffs into the lungs every 6 (six) hours as needed for wheezing or shortness of breath. Reported on 11/10/2015  . budesonide-formoterol (SYMBICORT) 80-4.5 MCG/ACT inhaler Inhale 2 puffs into the lungs 2 (two) times daily.  . fluticasone (FLONASE) 50 MCG/ACT nasal spray Place into both nostrils daily. Reported on 11/10/2015  . meloxicam (MOBIC) 15 MG tablet Take 1 tablet (15 mg total) by mouth daily.  . metoprolol tartrate (LOPRESSOR) 50 MG tablet TAKE 1/2 OF A TABLET BY MOUTH  TWICE A DAY  . Polyethylene Glycol 3350 (PEG 3350) POWD Take as directed for colonoscopy prep.  . rosuvastatin (CRESTOR) 20 MG tablet TAKE 1 TABLET BY MOUTH DAILY FOR HIGH CHOLESTEROL  . sertraline (ZOLOFT) 50 MG tablet Take 1 tablet (50 mg total) by mouth daily.  . [DISCONTINUED] SYMBICORT 160-4.5 MCG/ACT inhaler TAKE 2 PUFFS BY MOUTH TWICE A DAY (Patient not taking: Reported on 03/23/2019)   No facility-administered encounter medications on file as of 03/23/2019.     Surgical History: Past Surgical History:  Procedure Laterality Date  . ABDOMINAL HYSTERECTOMY    . BREAST SURGERY    . CATARACT EXTRACTION W/PHACO Left 10/04/2015   Procedure: CATARACT EXTRACTION PHACO AND INTRAOCULAR LENS PLACEMENT (IOC);  Surgeon: Birder Robson, MD;  Location: [facility] ORS;  Service: Ophthalmology;  Laterality: Left;  [place]: 00:46.8 AP%: 21.7 CDE: 10.19 Lot # [id number]  . CATARACT EXTRACTION W/PHACO Right 10/25/2015   Procedure: CATARACT EXTRACTION PHACO AND INTRAOCULAR LENS PLACEMENT ([place]);  Surgeon: Birder Robson, MD;  Location: [facility] ORS;  Service: Ophthalmology;  Laterality: Right;  [place] 00:48 AP% 25.2 CDE 12.16 fluid pack lot # [id number]  . COLONOSCOPY WITH PROPOFOL N/A 05/28/2016   Procedure: COLONOSCOPY WITH PROPOFOL;  Surgeon: Manya Silvas, MD;  Location: [facility] ENDOSCOPY;  Service: Endoscopy;  Laterality: N/A;    Medical History: Past Medical History:  Diagnosis Date  . Asthma   . Cancer [facility]) 2003   UTERINE, total Hysterectomy  . COPD (chronic obstructive pulmonary disease) ([place])   . Depression   . Hyperlipidemia   . Hypertension   . Shortness of breath dyspnea    DOE    Family History:  Family History  Problem Relation Age of Onset  . Kidney disease Mother   . Kidney cancer Neg Hx   . Prostate cancer Neg Hx     Social History: Social History   Socioeconomic History  . Marital status: Single    Spouse name: Not on file  . Number of children: Not on file  . Years of education:  Not on file  . Highest education level: Not on file  Occupational History  . Not on file  Social Needs  . Financial resource strain: Not on file  . Food insecurity    Worry: Not on file    Inability: Not on file  . Transportation needs    Medical: Not on file    Non-medical: Not on file  Tobacco Use  . Smoking status: Current Every Day Smoker    Packs/day: 1.00    Years: 25.00    Pack years: 25.00    Types: Cigarettes  . Smokeless tobacco: Never Used  Substance and Sexual Activity  . Alcohol use: No    Alcohol/week: 0.0 standard drinks  . Drug use: No  . Sexual activity: Not on file  Lifestyle  . Physical activity    Days per week: Not on file    Minutes per session: Not on file  . Stress: Not on file  Relationships  . Social Herbalist on phone: Not on file    Gets together: Not on file    Attends religious service: Not on file    Active member of club or organization: Not on file    Attends meetings of clubs or organizations: Not on file    Relationship status: Not on file  . Intimate partner violence    Fear of current or ex partner: Not on file    Emotionally abused: Not on file    Physically abused: Not on file    Forced sexual activity: Not on file  Other Topics Concern  . Not on file  Social History Narrative  . Not on file    Vital Signs: Blood pressure 128/82, pulse 91, resp. rate 16, height 5\' 4"  (1.626 m), weight 162 lb (73.5 kg), SpO2 95 %.  Examination: General Appearance: The patient is well-developed, well-nourished, and in no distress. Skin: Gross inspection of skin unremarkable. Head: normocephalic, no gross deformities. Eyes: no gross deformities noted. ENT: ears appear grossly normal no exudates. Neck: Supple. No thyromegaly. No LAD. Respiratory: no rhonchi are noted. Cardiovascular: Normal S1 and S2 without murmur or rub. Extremities: No cyanosis. pulses are equal. Neurologic: Alert and oriented. No involuntary  movements.  LABS: No results found for this or any previous visit (from the past 2160 hour(s)).  Radiology: No results found.  No results found.  No results found.    Assessment and Plan: Patient Active Problem List   Diagnosis Date Noted  . Screening for breast cancer 05/21/2018  . Flu vaccine need 05/21/2018  . Obstructive chronic bronchitis without exacerbation ([place]) 12/03/2017  . Nicotine dependence, cigarettes, uncomplicated 16/38/4536  . Mild intermittent asthma without complication 46/80/3212  . Vitamin D deficiency 10/17/2017  . Mixed hyperlipidemia 10/08/2017  . Essential hypertension 10/08/2017  . Generalized anxiety disorder 10/08/2017  . SUI (stress urinary incontinence, female) 11/12/2015  . Microscopic hematuria 11/12/2015  . Urge incontinence 11/12/2015    1. COPD MILD disease at this time. She is on symbicort and albuterol. She hardly uses the rescue inhaler 2. Smoker counseling provided at this time for  smoking cessation 3. SOB at baseline for her  General Counseling: I have discussed the findings of the evaluation and examination with Wendy Garza.  I have also discussed any further diagnostic evaluation thatmay be needed or ordered today. Wendy Garza verbalizes understanding of the findings of todays visit. We also reviewed her medications today and discussed drug interactions and side effects including but not limited excessive drowsiness and altered mental states. We also discussed that there is always a risk not just to her but also people around her. she has been encouraged to call the office with any questions or concerns that should arise related to todays visit.    Time spent: 50min  I have personally obtained a history, examined the patient, evaluated laboratory and imaging results, formulated the assessment and plan and placed orders.    Allyne Gee, MD [facility] Pulmonary and Critical Care Sleep medicine

## 2019-03-23 NOTE — Patient Instructions (Signed)
Coping with Quitting Smoking  Quitting smoking is a physical and mental challenge. You will face cravings, withdrawal symptoms, and temptation. Before quitting, work with your health care provider to make a plan that can help you cope. Preparation can help you quit and keep you from giving in. How can I cope with cravings? Cravings usually last for 5-10 minutes. If you get through it, the craving will pass. Consider taking the following actions to help you cope with cravings:  Keep your mouth busy: ? Chew sugar-free gum. ? Suck on hard candies or a straw. ? Brush your teeth.  Keep your hands and body busy: ? Immediately change to a different activity when you feel a craving. ? Squeeze or play with a ball. ? Do an activity or a hobby, like making bead jewelry, practicing needlepoint, or working with wood. ? Mix up your normal routine. ? Take a short exercise break. Go for a quick walk or run up and down stairs. ? Spend time in public places where smoking is not allowed.  Focus on doing something kind or helpful for someone else.  Call a friend or family member to talk during a craving.  Join a support group.  Call a quit line, such as 1-800-QUIT-NOW.  Talk with your health care provider about medicines that might help you cope with cravings and make quitting easier for you. How can I deal with withdrawal symptoms? Your body may experience negative effects as it tries to get used to not having nicotine in the system. These effects are called withdrawal symptoms. They may include:  Feeling hungrier than normal.  Trouble concentrating.  Irritability.  Trouble sleeping.  Feeling depressed.  Restlessness and agitation.  Craving a cigarette. To manage withdrawal symptoms:  Avoid places, people, and activities that trigger your cravings.  Remember why you want to quit.  Get plenty of sleep.  Avoid coffee and other caffeinated drinks. These may worsen some of your symptoms.  How can I handle social situations? Social situations can be difficult when you are quitting smoking, especially in the first few weeks. To manage this, you can:  Avoid parties, bars, and other social situations where people might be smoking.  Avoid alcohol.  Leave right away if you have the urge to smoke.  Explain to your family and friends that you are quitting smoking. Ask for understanding and support.  Plan activities with friends or family where smoking is not an option. What are some ways I can cope with stress? Wanting to smoke may cause stress, and stress can make you want to smoke. Find ways to manage your stress. Relaxation techniques can help. For example:  Breathe slowly and deeply, in through your nose and out through your mouth.  Listen to soothing, relaxing music.  Talk with a family member or friend about your stress.  Light a candle.  Soak in a bath or take a shower.  Think about a peaceful place. What are some ways I can prevent weight gain? Be aware that many people gain weight after they quit smoking. However, not everyone does. To keep from gaining weight, have a plan in place before you quit and stick to the plan after you quit. Your plan should include:  Having healthy snacks. When you have a craving, it may help to: ? Eat plain popcorn, crunchy carrots, celery, or other cut vegetables. ? Chew sugar-free gum.  Changing how you eat: ? Eat small portion sizes at meals. ? Eat 4-6 small meals   throughout the day instead of 1-2 large meals a day. ? Be mindful when you eat. Do not watch television or do other things that might distract you as you eat.  Exercising regularly: ? Make time to exercise each day. If you do not have time for a long workout, do short bouts of exercise for 5-10 minutes several times a day. ? Do some form of strengthening exercise, like weight lifting, and some form of aerobic exercise, like running or swimming.  Drinking plenty of  water or other low-calorie or no-calorie drinks. Drink 6-8 glasses of water daily, or as much as instructed by your health care provider. Summary  Quitting smoking is a physical and mental challenge. You will face cravings, withdrawal symptoms, and temptation to smoke again. Preparation can help you as you go through these challenges.  You can cope with cravings by keeping your mouth busy (such as by chewing gum), keeping your body and hands busy, and making calls to family, friends, or a helpline for people who want to quit smoking.  You can cope with withdrawal symptoms by avoiding places where people smoke, avoiding drinks with caffeine, and getting plenty of rest.  Ask your health care provider about the different ways to prevent weight gain, avoid stress, and handle social situations. This information is not intended to replace advice given to you by your health care provider. Make sure you discuss any questions you have with your health care provider. Document Released: 08/10/2016 Document Revised: 07/26/2017 Document Reviewed: 08/10/2016 Elsevier Patient Education  2020 [employer]  

## 2019-04-10 ENCOUNTER — Other Ambulatory Visit: Payer: Self-pay | Admitting: Nurse Practitioner

## 2019-04-10 MED ORDER — METOPROLOL TARTRATE 50 MG PO TABS: ORAL_TABLET | ORAL | 5 refills | Status: DC

## 2019-05-18 ENCOUNTER — Ambulatory Visit (INDEPENDENT_AMBULATORY_CARE_PROVIDER_SITE_OTHER): Payer: PPO | Admitting: Nurse Practitioner

## 2019-05-18 ENCOUNTER — Other Ambulatory Visit: Payer: Self-pay

## 2019-05-18 ENCOUNTER — Encounter: Payer: Self-pay | Admitting: Nurse Practitioner

## 2019-05-18 VITALS — BP 140/74 | HR 88 | Temp 97.7°F | Resp 16 | Ht 64.0 in | Wt 166.0 lb

## 2019-05-18 DIAGNOSIS — R300 Dysuria: Secondary | ICD-10-CM

## 2019-05-18 DIAGNOSIS — J4520 Mild intermittent asthma, uncomplicated: Secondary | ICD-10-CM

## 2019-05-18 DIAGNOSIS — Z1159 Encounter for screening for other viral diseases: Secondary | ICD-10-CM | POA: Diagnosis not present

## 2019-05-18 DIAGNOSIS — Z23 Encounter for immunization: Secondary | ICD-10-CM

## 2019-05-18 DIAGNOSIS — F411 Generalized anxiety disorder: Secondary | ICD-10-CM

## 2019-05-18 DIAGNOSIS — I10 Essential (primary) hypertension: Secondary | ICD-10-CM | POA: Diagnosis not present

## 2019-05-18 DIAGNOSIS — E782 Mixed hyperlipidemia: Secondary | ICD-10-CM

## 2019-05-18 DIAGNOSIS — G250 Essential tremor: Secondary | ICD-10-CM | POA: Diagnosis not present

## 2019-05-18 DIAGNOSIS — Z0001 Encounter for general adult medical examination with abnormal findings: Secondary | ICD-10-CM | POA: Diagnosis not present

## 2019-05-18 MED ORDER — PNEUMOCOCCAL 13-VAL CONJ VACC IM SUSP: 0.5000 mL | Freq: Once | INTRAMUSCULAR | 0 refills | Status: AC

## 2019-05-18 MED ORDER — SERTRALINE HCL 50 MG PO TABS: 50.0000 mg | ORAL_TABLET | Freq: Every day | ORAL | 1 refills | Status: DC

## 2019-05-18 MED ORDER — ALBUTEROL SULFATE HFA 108 (90 BASE) MCG/ACT IN AERS: 2.0000 | INHALATION_SPRAY | Freq: Four times a day (QID) | RESPIRATORY_TRACT | 5 refills | Status: DC | PRN

## 2019-05-18 NOTE — Progress Notes (Signed)
[facility] [place], [place] [address]  Internal MEDICINE  Office Visit Note  Patient Name: Wendy Garza  [id number]  [id number]  Date of Service: 05/31/2019   Pt is here for routine health maintenance examination  Chief Complaint  Patient presents with  . Annual Exam  . Hypertension  . Hyperlipidemia  . Depression  . Quality Metric Gaps    pna vacc     The patient is here for health maintenance exam. She states that she feels well and has no concerns or complaints. The patient is due to have mammogram.   Hypertension This is a chronic problem. The current episode started more than 1 year ago. The problem is unchanged. The problem is controlled. Pertinent negatives include no chest pain, headaches, neck pain, palpitations or shortness of breath. There are no associated agents to hypertension. Risk factors for coronary artery disease include post-menopausal state and dyslipidemia. Past treatments include beta blockers. The current treatment provides moderate improvement. There are no compliance problems.      Current Medication: Outpatient Encounter Medications as of 05/18/2019  Medication Sig  . albuterol (VENTOLIN HFA) 108 (90 Base) MCG/ACT inhaler Inhale 2 puffs into the lungs every 6 (six) hours as needed for wheezing or shortness of breath. Reported on 11/10/2015  . budesonide-formoterol (SYMBICORT) 80-4.5 MCG/ACT inhaler Inhale 2 puffs into the lungs 2 (two) times daily.  . fluticasone (FLONASE) 50 MCG/ACT nasal spray Place into both nostrils daily. Reported on 11/10/2015  . meloxicam (MOBIC) 15 MG tablet Take 1 tablet (15 mg total) by mouth daily.  . metoprolol tartrate (LOPRESSOR) 50 MG tablet TAKE 1/2 OF A TABLET BY MOUTH TWICE A DAY  . Polyethylene Glycol 3350 (PEG 3350) POWD Take as directed for colonoscopy prep.  . rosuvastatin (CRESTOR) 20 MG tablet TAKE 1 TABLET BY MOUTH DAILY FOR HIGH CHOLESTEROL  . sertraline (ZOLOFT) 50 MG tablet Take  1 tablet (50 mg total) by mouth daily.  . [DISCONTINUED] albuterol (PROVENTIL HFA;VENTOLIN HFA) 108 (90 Base) MCG/ACT inhaler Inhale 2 puffs into the lungs every 6 (six) hours as needed for wheezing or shortness of breath. Reported on 11/10/2015  . [DISCONTINUED] sertraline (ZOLOFT) 50 MG tablet Take 1 tablet (50 mg total) by mouth daily.  . [EXPIRED] pneumococcal 13-valent conjugate vaccine (PREVNAR 13) SUSP injection Inject 0.5 mLs into the muscle once for 1 dose.   No facility-administered encounter medications on file as of 05/18/2019.     Surgical History: Past Surgical History:  Procedure Laterality Date  . ABDOMINAL HYSTERECTOMY    . BREAST SURGERY    . CATARACT EXTRACTION W/PHACO Left 10/04/2015   Procedure: CATARACT EXTRACTION PHACO AND INTRAOCULAR LENS PLACEMENT (IOC);  Surgeon: Birder Robson, MD;  Location: [facility] ORS;  Service: Ophthalmology;  Laterality: Left;  [place]: 00:46.8 AP%: 21.7 CDE: 10.19 Lot # [id number]  . CATARACT EXTRACTION W/PHACO Right 10/25/2015   Procedure: CATARACT EXTRACTION PHACO AND INTRAOCULAR LENS PLACEMENT ([place]);  Surgeon: Birder Robson, MD;  Location: [facility] ORS;  Service: Ophthalmology;  Laterality: Right;  [place] 00:48 AP% 25.2 CDE 12.16 fluid pack lot # [id number]  . COLONOSCOPY WITH PROPOFOL N/A 05/28/2016   Procedure: COLONOSCOPY WITH PROPOFOL;  Surgeon: Manya Silvas, MD;  Location: [facility] ENDOSCOPY;  Service: Endoscopy;  Laterality: N/A;    Medical History: Past Medical History:  Diagnosis Date  . Asthma   . Cancer [facility]) 2003   UTERINE, total Hysterectomy  . COPD (chronic obstructive pulmonary disease) ([place])   . Depression   .  Hyperlipidemia   . Hypertension   . Shortness of breath dyspnea    DOE    Family History: Family History  Problem Relation Age of Onset  . Kidney disease Mother   . Kidney cancer Neg Hx   . Prostate cancer Neg Hx       Review of Systems  Constitutional: Negative for activity change, chills, fatigue and unexpected  weight change.  HENT: Negative for congestion, postnasal drip, rhinorrhea, sneezing and sore throat.   Respiratory: Negative for cough, choking, chest tightness, shortness of breath and wheezing.   Cardiovascular: Negative for chest pain and palpitations.  Gastrointestinal: Negative for abdominal pain, constipation, diarrhea, nausea and vomiting.  Endocrine: Negative for cold intolerance, heat intolerance, polydipsia and polyuria.  Genitourinary: Negative for dysuria and frequency.  Musculoskeletal: Negative for arthralgias, back pain, joint swelling and neck pain.  Skin: Negative for rash.  Allergic/Immunologic: Positive for environmental allergies.  Neurological: Negative for tremors, numbness and headaches.  Hematological: Negative for adenopathy. Does not bruise/bleed easily.  Psychiatric/Behavioral: Negative for behavioral problems (Depression), sleep disturbance and suicidal ideas. The patient is not nervous/anxious.      Today's Vitals   05/18/19 1415  BP: 140/74  Pulse: 88  Resp: 16  Temp: 97.7 F (36.5 C)  SpO2: 94%  Weight: 166 lb (75.3 kg)  Height: 5\' 4"  (1.626 m)   Body mass index is 28.49 kg/m.  Physical Exam Vitals signs and nursing note reviewed.  Constitutional:      General: She is not in acute distress.    Appearance: Normal appearance. She is well-developed. She is not diaphoretic.  HENT:     Head: Normocephalic and atraumatic.     Nose: Nose normal.     Mouth/Throat:     Pharynx: No oropharyngeal exudate.  Eyes:     Conjunctiva/sclera: Conjunctivae normal.     Pupils: Pupils are equal, round, and reactive to light.  Neck:     Musculoskeletal: Normal range of motion and neck supple.     Thyroid: No thyromegaly.     Vascular: No carotid bruit or JVD.     Trachea: No tracheal deviation.  Cardiovascular:     Rate and Rhythm: Normal rate and regular rhythm.     Pulses: Normal pulses.     Heart sounds: Normal heart sounds. No murmur. No friction rub.  No gallop.   Pulmonary:     Effort: Pulmonary effort is normal. No respiratory distress.     Breath sounds: Normal breath sounds. No wheezing or rales.  Chest:     Chest wall: No tenderness.     Breasts:        Right: No inverted nipple, mass, nipple discharge, skin change or tenderness.        Left: No inverted nipple, mass, nipple discharge, skin change or tenderness.  Abdominal:     General: Bowel sounds are normal.     Palpations: Abdomen is soft.     Tenderness: There is no abdominal tenderness.  Musculoskeletal: Normal range of motion.  Lymphadenopathy:     Cervical: No cervical adenopathy.  Skin:    General: Skin is warm and dry.     Capillary Refill: Capillary refill takes less than 2 seconds.  Neurological:     General: No focal deficit present.     Mental Status: She is alert and oriented to person, place, and time.     Cranial Nerves: No cranial nerve deficit.  Psychiatric:        Behavior:  Behavior normal.        Thought Content: Thought content normal.        Judgment: Judgment normal.    Depression screen [facility] 2/9 05/18/2019 05/18/2019 01/16/2019 05/13/2018 12/03/2017  Decreased Interest 0 0 0 0 0  Down, Depressed, Hopeless 0 0 0 0 0  PHQ - 2 Score 0 0 0 0 0    Functional Status Survey: Is the patient deaf or have difficulty hearing?: No Does the patient have difficulty seeing, even when wearing glasses/contacts?: No Does the patient have difficulty concentrating, remembering, or making decisions?: No Does the patient have difficulty walking or climbing stairs?: No Does the patient have difficulty dressing or bathing?: No Does the patient have difficulty doing errands alone such as visiting a doctor's office or shopping?: No  MMSE - Mini Mental State Exam 05/18/2019 05/13/2018  Orientation to time 5 5  Orientation to Place 5 5  Registration 3 3  Attention/ Calculation 5 5  Recall 3 3  Language- name 2 objects 2 2  Language- repeat 1 1  Language- follow 3 step  command 3 3  Language- read & follow direction 1 1  Write a sentence 1 1  Copy design 1 1  Total score 30 30    Fall Risk  05/18/2019 05/18/2019 01/16/2019 05/13/2018 12/03/2017  Falls in the past year? 0 0 0 No No      LABS: Recent Results (from the past 2160 hour(s))  UA/M w/rflx Culture, Routine     Status: Abnormal   Collection Time: 05/18/19  2:07 PM   Specimen: Urine   URINE  Result Value Ref Range   Specific Gravity, UA 1.020 1.005 - 1.030   pH, UA 5.5 5.0 - 7.5   Color, UA Yellow Yellow   Appearance Ur Clear Clear   Leukocytes,UA 1+ (A) Negative   Protein,UA Negative Negative/Trace   Glucose, UA Negative Negative   Ketones, UA Negative Negative   RBC, UA 2+ (A) Negative   Bilirubin, UA Negative Negative   Urobilinogen, Ur 0.2 0.2 - 1.0 mg/dL   Nitrite, UA Negative Negative   Microscopic Examination See below:     Comment: Microscopic was indicated and was performed.   Urinalysis Reflex Comment     Comment: This specimen has reflexed to a Urine Culture.  Microscopic Examination     Status: Abnormal   Collection Time: 05/18/19  2:07 PM   URINE  Result Value Ref Range   WBC, UA 6-10 (A) 0 - 5 /hpf   RBC 11-30 (A) 0 - 2 /hpf   Epithelial Cells (non renal) 0-10 0 - 10 /hpf   Casts Present (A) None seen /lpf   Cast Type Hyaline casts N/A   Mucus, UA Present Not Estab.   Bacteria, UA Few None seen/Few  Urine Culture, Reflex     Status: Abnormal   Collection Time: 05/18/19  2:07 PM   URINE  Result Value Ref Range   Urine Culture, Routine Final report (A)    Organism ID, Bacteria Klebsiella pneumoniae (A)     Comment: Greater than 100,000 colony forming units per mL Cefazolin <=4 ug/mL Cefazolin with an MIC <=16 predicts susceptibility to the oral agents cefaclor, cefdinir, cefpodoxime, cefprozil, cefuroxime, cephalexin, and loracarbef when used for therapy of uncomplicated urinary tract infections due to E. coli, Klebsiella pneumoniae, and Proteus mirabilis.     Antimicrobial Susceptibility Comment     Comment:       ** S = Susceptible; I =  Intermediate; R = Resistant **                    P = Positive; N = Negative             MICS are expressed in micrograms per mL    Antibiotic                 RSLT#1    RSLT#2    RSLT#3    RSLT#4 Amoxicillin/Clavulanic Acid    S Ampicillin                     R Cefepime                       S Ceftriaxone                    S Cefuroxime                     S Ciprofloxacin                  S Ertapenem                      S Gentamicin                     S Imipenem                       S Levofloxacin                   S Meropenem                      S Nitrofurantoin                 S Piperacillin/Tazobactam        S Tetracycline                   S Tobramycin                     S Trimethoprim/Sulfa             S   Comprehensive metabolic panel     Status: Abnormal   Collection Time: 05/21/19  9:12 AM  Result Value Ref Range   Glucose 121 (H) 65 - 99 mg/dL   BUN 12 8 - 27 mg/dL   Creatinine, Ser 0.87 0.57 - 1.00 mg/dL   GFR calc non Af Amer 68 >59 mL/min/1.73   GFR calc Af Amer 78 >59 mL/min/1.73   BUN/Creatinine Ratio 14 12 - 28   Sodium 139 134 - 144 mmol/L   Potassium 4.6 3.5 - 5.2 mmol/L   Chloride 104 96 - 106 mmol/L   CO2 20 20 - 29 mmol/L   Calcium 9.4 8.7 - 10.3 mg/dL   Total Protein 6.8 6.0 - 8.5 g/dL   Albumin 4.2 3.8 - 4.8 g/dL   Globulin, Total 2.6 1.5 - 4.5 g/dL   Albumin/Globulin Ratio 1.6 1.2 - 2.2   Bilirubin Total 0.4 0.0 - 1.2 mg/dL   Alkaline Phosphatase 73 39 - 117 IU/L   AST 21 0 - 40 IU/L   ALT 16 0 - 32 IU/L  CBC     Status: None   Collection Time: 05/21/19  9:12 AM  Result Value Ref Range   WBC  8.4 3.4 - 10.8 x10E3/uL   RBC 4.28 3.77 - 5.28 x10E6/uL   Hemoglobin 14.0 11.1 - 15.9 g/dL   Hematocrit 41.6 34.0 - 46.6 %   MCV 97 79 - 97 fL   MCH 32.7 26.6 - 33.0 pg   MCHC 33.7 31.5 - 35.7 g/dL   RDW 12.8 11.7 - 15.4 %   Platelets 299 150 - 450 x10E3/uL  Lipid  Panel w/o Chol/HDL Ratio     Status: Abnormal   Collection Time: 05/21/19  9:12 AM  Result Value Ref Range   Cholesterol, Total 122 100 - 199 mg/dL   Triglycerides 227 (H) 0 - 149 mg/dL   HDL 24 (L) >39 mg/dL   VLDL Cholesterol Cal 37 5 - 40 mg/dL   LDL Chol Calc (NIH) 61 0 - 99 mg/dL  HCV Ab w Reflex to Quant PCR     Status: None   Collection Time: 05/21/19  9:12 AM  Result Value Ref Range   HCV Ab <0.1 0.0 - 0.9 s/co ratio  T4, free     Status: None   Collection Time: 05/21/19  9:12 AM  Result Value Ref Range   Free T4 0.86 0.82 - 1.77 ng/dL  TSH     Status: Abnormal   Collection Time: 05/21/19  9:12 AM  Result Value Ref Range   TSH 4.540 (H) 0.450 - 4.500 uIU/mL  VITAMIN D 25 Hydroxy (Vit-D Deficiency, Fractures)     Status: Abnormal   Collection Time: 05/21/19  9:12 AM  Result Value Ref Range   Vit D, 25-Hydroxy 13.9 (L) 30.0 - 100.0 ng/mL    Comment: Vitamin D deficiency has been defined by the [place] and an Endocrine Society practice guideline as a level of serum 25-OH vitamin D less than 20 ng/mL (1,2). The Endocrine Society went on to further define vitamin D insufficiency as a level between 21 and 29 ng/mL (2). 1. IOM (Institute of Medicine). 2010. Dietary reference    intakes for calcium and D. [place]: The    [employer]. 2. Holick MF, Binkley Dupont, Bischoff-Ferrari HA, et al.    Evaluation, treatment, and prevention of vitamin D    deficiency: an Endocrine Society clinical practice    guideline. JCEM. 2011 Jul; 96(7):1911-30.   Interpretation:     Status: None   Collection Time: 05/21/19  9:12 AM  Result Value Ref Range   HCV Interp 1: Comment     Comment: Negative Not infected with HCV, unless recent infection is suspected or other evidence exists to indicate HCV infection.    Assessment/Plan: 1. Encounter for general adult medical examination with abnormal findings Annual health maintenance exam today.   2. Essential  hypertension Stable. Continue bp medication as prescribed   3. Mild intermittent asthma without complication May use rescue inhaler as needed and as prescribed.  - albuterol (VENTOLIN HFA) 108 (90 Base) MCG/ACT inhaler; Inhale 2 puffs into the lungs every 6 (six) hours as needed for wheezing or shortness of breath. Reported on 11/10/2015  Dispense: 18 g; Refill: 5  4. Mixed hyperlipidemia Check fasting labs. Adjust crestor as indicated.   5. Benign essential tremor Will monitor.   6. Generalized anxiety disorder Continue sertraline 50mg  daily.  - sertraline (ZOLOFT) 50 MG tablet; Take 1 tablet (50 mg total) by mouth daily.  Dispense: 90 tablet; Refill: 1  7. Need for vaccination against Streptococcus pneumoniae using pneumococcal conjugate vaccine 13 Prescription for prevnar 13 sent to her pharmacy  for administration - pneumococcal 13-valent conjugate vaccine (PREVNAR 13) SUSP injection; Inject 0.5 mLs into the muscle once for 1 dose.  Dispense: 0.5 mL; Refill: 0  8. Dysuria - UA/M w/rflx Culture, Routine  9. Encounter for hepatitis C screening test for low risk patient Check hepatitis C with routine labs.   General Counseling: nylea writer understanding of the findings of todays visit and agrees with plan of treatment. I have discussed any further diagnostic evaluation that may be needed or ordered today. We also reviewed her medications today. she has been encouraged to call the office with any questions or concerns that should arise related to todays visit.    Counseling:  This patient was seen by Leretha Pol FNP Collaboration with Dr Lavera Guise as a part of collaborative care agreement  Orders Placed This Encounter  Procedures  . Microscopic Examination  . Urine Culture, Reflex  . UA/M w/rflx Culture, Routine    Meds ordered this encounter  Medications  . sertraline (ZOLOFT) 50 MG tablet    Sig: Take 1 tablet (50 mg total) by mouth daily.    Dispense:  90  tablet    Refill:  1    Order Specific Question:   Supervising Provider    Answer:   Lavera Guise [id number]  . albuterol (VENTOLIN HFA) 108 (90 Base) MCG/ACT inhaler    Sig: Inhale 2 puffs into the lungs every 6 (six) hours as needed for wheezing or shortness of breath. Reported on 11/10/2015    Dispense:  18 g    Refill:  5    Order Specific Question:   Supervising Provider    Answer:   Lavera Guise [id number]  . pneumococcal 13-valent conjugate vaccine (PREVNAR 13) SUSP injection    Sig: Inject 0.5 mLs into the muscle once for 1 dose.    Dispense:  0.5 mL    Refill:  0    Order Specific Question:   Supervising Provider    Answer:   Lavera Guise [id number]    Time spent: [place], MD  Internal Medicine

## 2019-05-19 NOTE — Progress Notes (Signed)
Waiting on culture results.

## 2019-05-21 ENCOUNTER — Other Ambulatory Visit: Payer: Self-pay | Admitting: Nurse Practitioner

## 2019-05-21 DIAGNOSIS — Z7689 Persons encountering health services in other specified circumstances: Secondary | ICD-10-CM | POA: Diagnosis not present

## 2019-05-21 LAB — UA/M W/RFLX CULTURE, ROUTINE
Bilirubin, UA: NEGATIVE
Glucose, UA: NEGATIVE
Ketones, UA: NEGATIVE
Nitrite, UA: NEGATIVE
Protein,UA: NEGATIVE
Specific Gravity, UA: 1.02 (ref 1.005–1.030)
Urobilinogen, Ur: 0.2 mg/dL (ref 0.2–1.0)
pH, UA: 5.5 (ref 5.0–7.5)

## 2019-05-21 LAB — URINE CULTURE, REFLEX

## 2019-05-21 LAB — MICROSCOPIC EXAMINATION

## 2019-05-22 LAB — COMPREHENSIVE METABOLIC PANEL
ALT: 16 IU/L (ref 0–32)
AST: 21 IU/L (ref 0–40)
Albumin/Globulin Ratio: 1.6 (ref 1.2–2.2)
Albumin: 4.2 g/dL (ref 3.8–4.8)
Alkaline Phosphatase: 73 IU/L (ref 39–117)
BUN/Creatinine Ratio: 14 (ref 12–28)
BUN: 12 mg/dL (ref 8–27)
Bilirubin Total: 0.4 mg/dL (ref 0.0–1.2)
CO2: 20 mmol/L (ref 20–29)
Calcium: 9.4 mg/dL (ref 8.7–10.3)
Chloride: 104 mmol/L (ref 96–106)
Creatinine, Ser: 0.87 mg/dL (ref 0.57–1.00)
GFR calc Af Amer: 78 mL/min/{1.73_m2} (ref >59)
GFR calc non Af Amer: 68 mL/min/{1.73_m2} (ref >59)
Globulin, Total: 2.6 g/dL (ref 1.5–4.5)
Glucose: 121 mg/dL — ABNORMAL HIGH (ref 65–99)
Potassium: 4.6 mmol/L (ref 3.5–5.2)
Sodium: 139 mmol/L (ref 134–144)
Total Protein: 6.8 g/dL (ref 6.0–8.5)

## 2019-05-22 LAB — LIPID PANEL W/O CHOL/HDL RATIO
Cholesterol, Total: 122 mg/dL (ref 100–199)
HDL: 24 mg/dL — ABNORMAL LOW (ref >39)
LDL Chol Calc (NIH): 61 mg/dL (ref 0–99)
Triglycerides: 227 mg/dL — ABNORMAL HIGH (ref 0–149)
VLDL Cholesterol Cal: 37 mg/dL (ref 5–40)

## 2019-05-22 LAB — VITAMIN D 25 HYDROXY (VIT D DEFICIENCY, FRACTURES): Vit D, 25-Hydroxy: 13.9 ng/mL — ABNORMAL LOW (ref 30.0–100.0)

## 2019-05-22 LAB — CBC
Hematocrit: 41.6 % (ref 34.0–46.6)
Hemoglobin: 14 g/dL (ref 11.1–15.9)
MCH: 32.7 pg (ref 26.6–33.0)
MCHC: 33.7 g/dL (ref 31.5–35.7)
MCV: 97 fL (ref 79–97)
Platelets: 299 10*3/uL (ref 150–450)
RBC: 4.28 x10E6/uL (ref 3.77–5.28)
RDW: 12.8 % (ref 11.7–15.4)
WBC: 8.4 10*3/uL (ref 3.4–10.8)

## 2019-05-22 LAB — TSH: TSH: 4.54 u[IU]/mL — ABNORMAL HIGH (ref 0.450–4.500)

## 2019-05-22 LAB — HCV AB W REFLEX TO QUANT PCR: HCV Ab: <0.1 s/co ratio (ref 0.0–0.9)

## 2019-05-22 LAB — HCV INTERPRETATION

## 2019-05-22 LAB — T4, FREE: Free T4: 0.86 ng/dL (ref 0.82–1.77)

## 2019-05-24 ENCOUNTER — Other Ambulatory Visit: Payer: Self-pay | Admitting: Internal Medicine

## 2019-05-24 MED ORDER — CIPROFLOXACIN HCL 500 MG PO TABS: 500.0000 mg | ORAL_TABLET | Freq: Two times a day (BID) | ORAL | 0 refills | Status: DC

## 2019-05-24 NOTE — Progress Notes (Signed)
cipro send, needs bladder u/s

## 2019-05-25 ENCOUNTER — Other Ambulatory Visit: Payer: Self-pay | Admitting: Internal Medicine

## 2019-05-25 DIAGNOSIS — R319 Hematuria, unspecified: Secondary | ICD-10-CM

## 2019-05-26 ENCOUNTER — Telehealth: Payer: Self-pay

## 2019-05-26 DIAGNOSIS — Z1231 Encounter for screening mammogram for malignant neoplasm of breast: Secondary | ICD-10-CM | POA: Diagnosis not present

## 2019-05-26 NOTE — Telephone Encounter (Signed)
-----   Message from Lavera Guise, MD sent at 05/24/2019 10:45 AM EDT ----- Dian Situ for cipro for uti. Pt needs renal and bladder u/s ( DX, blood in urine )

## 2019-05-26 NOTE — Telephone Encounter (Signed)
Pt advised by tat we send med to phar and also beth make appt for U/S

## 2019-05-29 ENCOUNTER — Other Ambulatory Visit: Payer: Self-pay

## 2019-05-29 ENCOUNTER — Ambulatory Visit: Payer: PPO

## 2019-05-29 DIAGNOSIS — R319 Hematuria, unspecified: Secondary | ICD-10-CM

## 2019-05-31 DIAGNOSIS — Z0001 Encounter for general adult medical examination with abnormal findings: Secondary | ICD-10-CM | POA: Insufficient documentation

## 2019-05-31 DIAGNOSIS — Z23 Encounter for immunization: Secondary | ICD-10-CM | POA: Insufficient documentation

## 2019-05-31 DIAGNOSIS — Z1159 Encounter for screening for other viral diseases: Secondary | ICD-10-CM | POA: Insufficient documentation

## 2019-05-31 DIAGNOSIS — R300 Dysuria: Secondary | ICD-10-CM | POA: Insufficient documentation

## 2019-05-31 DIAGNOSIS — G250 Essential tremor: Secondary | ICD-10-CM | POA: Insufficient documentation

## 2019-06-03 NOTE — Progress Notes (Signed)
Needs treatment with vitamin d. Discuss triglycerides level. Discuss at visit 06/08/2019

## 2019-06-08 ENCOUNTER — Encounter: Payer: Self-pay | Admitting: Nurse Practitioner

## 2019-06-08 ENCOUNTER — Other Ambulatory Visit: Payer: Self-pay

## 2019-06-08 ENCOUNTER — Ambulatory Visit (INDEPENDENT_AMBULATORY_CARE_PROVIDER_SITE_OTHER): Payer: PPO | Admitting: Nurse Practitioner

## 2019-06-08 VITALS — BP 155/76 | HR 83 | Temp 97.0°F | Resp 16 | Ht 64.0 in | Wt 165.0 lb

## 2019-06-08 DIAGNOSIS — I10 Essential (primary) hypertension: Secondary | ICD-10-CM

## 2019-06-08 DIAGNOSIS — E782 Mixed hyperlipidemia: Secondary | ICD-10-CM | POA: Diagnosis not present

## 2019-06-08 DIAGNOSIS — R319 Hematuria, unspecified: Secondary | ICD-10-CM

## 2019-06-08 DIAGNOSIS — R300 Dysuria: Secondary | ICD-10-CM | POA: Diagnosis not present

## 2019-06-08 DIAGNOSIS — E559 Vitamin D deficiency, unspecified: Secondary | ICD-10-CM | POA: Diagnosis not present

## 2019-06-08 LAB — POCT URINALYSIS DIPSTICK
Bilirubin, UA: NEGATIVE
Glucose, UA: NEGATIVE
Ketones, UA: NEGATIVE
Leukocytes, UA: NEGATIVE
Nitrite, UA: NEGATIVE
Protein, UA: NEGATIVE
Spec Grav, UA: 1.025 (ref 1.010–1.025)
Urobilinogen, UA: 0.2 E.U./dL
pH, UA: 6 (ref 5.0–8.0)

## 2019-06-08 MED ORDER — ERGOCALCIFEROL 1.25 MG (50000 UT) PO CAPS: 50000.0000 [IU] | ORAL_CAPSULE | ORAL | 5 refills | Status: DC

## 2019-06-08 NOTE — Patient Instructions (Signed)
Dietary Guidelines to Help Prevent Kidney Stones Kidney stones are deposits of minerals and salts that form inside your kidneys. Your risk of developing kidney stones may be greater depending on your diet, your lifestyle, the medicines you take, and whether you have certain medical conditions. Most people can reduce their chances of developing kidney stones by following the instructions below. Depending on your overall health and the type of kidney stones you tend to develop, your dietitian may give you more specific instructions. What are tips for following this plan? Reading food labels  Choose foods with "no salt added" or "low-salt" labels. Limit your sodium intake to less than 1500 mg per day.  Choose foods with calcium for each meal and snack. Try to eat about 300 mg of calcium at each meal. Foods that contain 200-500 mg of calcium per serving include: ? 8 oz (237 ml) of milk, fortified nondairy milk, and fortified fruit juice. ? 8 oz (237 ml) of kefir, yogurt, and soy yogurt. ? 4 oz (118 ml) of tofu. ? 1 oz of cheese. ? 1 cup (300 g) of dried figs. ? 1 cup (91 g) of cooked broccoli. ? 1-3 oz can of sardines or mackerel.  Most people need 1000 to 1500 mg of calcium each day. Talk to your dietitian about how much calcium is recommended for you. Shopping  Buy plenty of fresh fruits and vegetables. Most people do not need to avoid fruits and vegetables, even if they contain nutrients that may contribute to kidney stones.  When shopping for convenience foods, choose: ? Whole pieces of fruit. ? Premade salads with dressing on the side. ? Low-fat fruit and yogurt smoothies.  Avoid buying frozen meals or prepared deli foods.  Look for foods with live cultures, such as yogurt and kefir. Cooking  Do not add salt to food when cooking. Place a salt shaker on the table and allow each person to add his or her own salt to taste.  Use vegetable protein, such as beans, textured vegetable  protein (TVP), or tofu instead of meat in pasta, casseroles, and soups. Meal planning   Eat less salt, if told by your dietitian. To do this: ? Avoid eating processed or premade food. ? Avoid eating fast food.  Eat less animal protein, including cheese, meat, poultry, or fish, if told by your dietitian. To do this: ? Limit the number of times you have meat, poultry, fish, or cheese each week. Eat a diet free of meat at least 2 days a week. ? Eat only one serving each day of meat, poultry, fish, or seafood. ? When you prepare animal protein, cut pieces into small portion sizes. For most meat and fish, one serving is about the size of one deck of cards.  Eat at least 5 servings of fresh fruits and vegetables each day. To do this: ? Keep fruits and vegetables on hand for snacks. ? Eat 1 piece of fruit or a handful of berries with breakfast. ? Have a salad and fruit at lunch. ? Have two kinds of vegetables at dinner.  Limit foods that are high in a substance called oxalate. These include: ? Spinach. ? Rhubarb. ? Beets. ? Potato chips and french fries. ? Nuts.  If you regularly take a diuretic medicine, make sure to eat at least 1-2 fruits or vegetables high in potassium each day. These include: ? Avocado. ? Banana. ? Orange, prune, carrot, or tomato juice. ? Baked potato. ? Cabbage. ? Beans and split   peas. General instructions   Drink enough fluid to keep your urine clear or pale yellow. This is the most important thing you can do.  Talk to your health care provider and dietitian about taking daily supplements. Depending on your health and the cause of your kidney stones, you may be advised: ? Not to take supplements with vitamin C. ? To take a calcium supplement. ? To take a daily probiotic supplement. ? To take other supplements such as magnesium, fish oil, or vitamin B6.  Take all medicines and supplements as told by your health care provider.  Limit alcohol intake to no  more than 1 drink a day for nonpregnant women and 2 drinks a day for men. One drink equals 12 oz of beer, 5 oz of wine, or 1 oz of hard liquor.  Lose weight if told by your health care provider. Work with your dietitian to find strategies and an eating plan that works best for you. What foods are not recommended? Limit your intake of the following foods, or as told by your dietitian. Talk to your dietitian about specific foods you should avoid based on the type of kidney stones and your overall health. Grains Breads. Bagels. Rolls. Baked goods. Salted crackers. Cereal. Pasta. Vegetables Spinach. Rhubarb. Beets. Canned vegetables. Pickles. Olives. Meats and other protein foods Nuts. Nut butters. Large portions of meat, poultry, or fish. Salted or cured meats. Deli meats. Hot dogs. Sausages. Dairy Cheese. Beverages Regular soft drinks. Regular vegetable juice. Seasonings and other foods Seasoning blends with salt. Salad dressings. Canned soups. Soy sauce. Ketchup. Barbecue sauce. Canned pasta sauce. Casseroles. Pizza. Lasagna. Frozen meals. Potato chips. French fries. Summary  You can reduce your risk of kidney stones by making changes to your diet.  The most important thing you can do is drink enough fluid. You should drink enough fluid to keep your urine clear or pale yellow.  Ask your health care provider or dietitian how much protein from animal sources you should eat each day, and also how much salt and calcium you should have each day. This information is not intended to replace advice given to you by your health care provider. Make sure you discuss any questions you have with your health care provider. Document Released: 12/08/2010 Document Revised: 12/03/2018 Document Reviewed: 07/24/2016 Elsevier Patient Education  2020 [employer]  

## 2019-06-08 NOTE — Progress Notes (Signed)
[facility] [place], [place] [address]  Internal MEDICINE  Office Visit Note  Patient Name: LATRISA ONSUREZ  [id number]  [id number]  Date of Service: 06/21/2019  Chief Complaint  Patient presents with  . Follow-up    ultrasound results    The patient is here for follow up of lab and ultrasound results. At her recent CPE, it was noted that she had a great amount of blood in her urine. Renal functions were within normal limits and she did not have symptoms of urinary tract infection. Labs indicated vitamin d deficiency. She also had moderately elevated triglyceride levels, though LDL, HDL, and total cholesterol levels were normal. Her renal ultrasound did indicate evidence of nephrocalcinosis. It was otherwise unremarkable.       Current Medication: Outpatient Encounter Medications as of 06/08/2019  Medication Sig  . albuterol (VENTOLIN HFA) 108 (90 Base) MCG/ACT inhaler Inhale 2 puffs into the lungs every 6 (six) hours as needed for wheezing or shortness of breath. Reported on 11/10/2015  . budesonide-formoterol (SYMBICORT) 80-4.5 MCG/ACT inhaler Inhale 2 puffs into the lungs 2 (two) times daily.  . ciprofloxacin (CIPRO) 500 MG tablet Take 1 tablet (500 mg total) by mouth 2 (two) times daily.  . fluticasone (FLONASE) 50 MCG/ACT nasal spray Place into both nostrils daily. Reported on 11/10/2015  . meloxicam (MOBIC) 15 MG tablet Take 1 tablet (15 mg total) by mouth daily.  . metoprolol tartrate (LOPRESSOR) 50 MG tablet TAKE 1/2 OF A TABLET BY MOUTH TWICE A DAY  . Polyethylene Glycol 3350 (PEG 3350) POWD Take as directed for colonoscopy prep.  . rosuvastatin (CRESTOR) 20 MG tablet TAKE 1 TABLET BY MOUTH DAILY FOR HIGH CHOLESTEROL  . sertraline (ZOLOFT) 50 MG tablet Take 1 tablet (50 mg total) by mouth daily.  . ergocalciferol (DRISDOL) 1.25 MG (50000 UT) capsule Take 1 capsule (50,000 Units total) by mouth once a week.   No facility-administered encounter  medications on file as of 06/08/2019.     Surgical History: Past Surgical History:  Procedure Laterality Date  . ABDOMINAL HYSTERECTOMY    . BREAST SURGERY    . CATARACT EXTRACTION W/PHACO Left 10/04/2015   Procedure: CATARACT EXTRACTION PHACO AND INTRAOCULAR LENS PLACEMENT (IOC);  Surgeon: Birder Robson, MD;  Location: [facility] ORS;  Service: Ophthalmology;  Laterality: Left;  [place]: 00:46.8 AP%: 21.7 CDE: 10.19 Lot # [id number]  . CATARACT EXTRACTION W/PHACO Right 10/25/2015   Procedure: CATARACT EXTRACTION PHACO AND INTRAOCULAR LENS PLACEMENT ([place]);  Surgeon: Birder Robson, MD;  Location: [facility] ORS;  Service: Ophthalmology;  Laterality: Right;  [place] 00:48 AP% 25.2 CDE 12.16 fluid pack lot # [id number]  . COLONOSCOPY WITH PROPOFOL N/A 05/28/2016   Procedure: COLONOSCOPY WITH PROPOFOL;  Surgeon: Manya Silvas, MD;  Location: [facility] ENDOSCOPY;  Service: Endoscopy;  Laterality: N/A;    Medical History: Past Medical History:  Diagnosis Date  . Asthma   . Cancer [facility]) 2003   UTERINE, total Hysterectomy  . COPD (chronic obstructive pulmonary disease) ([place])   . Depression   . Hyperlipidemia   . Hypertension   . Shortness of breath dyspnea    DOE    Family History: Family History  Problem Relation Age of Onset  . Kidney disease Mother   . Kidney cancer Neg Hx   . Prostate cancer Neg Hx     Social History   Socioeconomic History  . Marital status: Single    Spouse name: Not on file  . Number of children: Not  on file  . Years of education: Not on file  . Highest education level: Not on file  Occupational History  . Not on file  Social Needs  . Financial resource strain: Not on file  . Food insecurity    Worry: Not on file    Inability: Not on file  . Transportation needs    Medical: Not on file    Non-medical: Not on file  Tobacco Use  . Smoking status: Current Every Day Smoker    Packs/day: 1.00    Years: 25.00    Pack years: 25.00    Types: Cigarettes  . Smokeless  tobacco: Never Used  Substance and Sexual Activity  . Alcohol use: No    Alcohol/week: 0.0 standard drinks  . Drug use: No  . Sexual activity: Not on file  Lifestyle  . Physical activity    Days per week: Not on file    Minutes per session: Not on file  . Stress: Not on file  Relationships  . Social Herbalist on phone: Not on file    Gets together: Not on file    Attends religious service: Not on file    Active member of club or organization: Not on file    Attends meetings of clubs or organizations: Not on file    Relationship status: Not on file  . Intimate partner violence    Fear of current or ex partner: Not on file    Emotionally abused: Not on file    Physically abused: Not on file    Forced sexual activity: Not on file  Other Topics Concern  . Not on file  Social History Narrative  . Not on file      Review of Systems  Constitutional: Negative for activity change, chills, fatigue and unexpected weight change.  HENT: Negative for congestion, postnasal drip, rhinorrhea, sneezing and sore throat.   Respiratory: Negative for cough, choking, chest tightness, shortness of breath and wheezing.   Cardiovascular: Negative for chest pain and palpitations.  Gastrointestinal: Negative for abdominal pain, constipation, diarrhea, nausea and vomiting.  Endocrine: Negative for cold intolerance, heat intolerance, polydipsia and polyuria.  Genitourinary: Positive for hematuria. Negative for dysuria and frequency.  Musculoskeletal: Negative for arthralgias, back pain, joint swelling and neck pain.  Skin: Negative for rash.  Allergic/Immunologic: Positive for environmental allergies.  Neurological: Negative for tremors, numbness and headaches.  Hematological: Negative for adenopathy. Does not bruise/bleed easily.  Psychiatric/Behavioral: Negative for behavioral problems (Depression), sleep disturbance and suicidal ideas. The patient is not nervous/anxious.     Today's  Vitals   06/08/19 1530  BP: (!) 155/76  Pulse: 83  Resp: 16  Temp: (!) 97 F (36.1 C)  SpO2: 97%  Weight: 165 lb (74.8 kg)  Height: 5\' 4"  (1.626 m)   Body mass index is 28.32 kg/m.  Physical Exam Vitals signs and nursing note reviewed.  Constitutional:      General: She is not in acute distress.    Appearance: Normal appearance. She is well-developed. She is not diaphoretic.  HENT:     Head: Normocephalic and atraumatic.     Mouth/Throat:     Pharynx: No oropharyngeal exudate.  Eyes:     Pupils: Pupils are equal, round, and reactive to light.  Neck:     Musculoskeletal: Normal range of motion and neck supple.     Thyroid: No thyromegaly.     Vascular: No carotid bruit or JVD.     Trachea:  No tracheal deviation.  Cardiovascular:     Rate and Rhythm: Normal rate and regular rhythm.     Heart sounds: Normal heart sounds. No murmur. No friction rub. No gallop.   Pulmonary:     Effort: Pulmonary effort is normal. No respiratory distress.     Breath sounds: Normal breath sounds. No wheezing or rales.  Chest:     Chest wall: No tenderness.  Abdominal:     Palpations: Abdomen is soft.  Genitourinary:    Comments: Urine sample is positive for blood Musculoskeletal: Normal range of motion.  Lymphadenopathy:     Cervical: No cervical adenopathy.  Skin:    General: Skin is warm and dry.  Neurological:     Mental Status: She is alert and oriented to person, place, and time.     Cranial Nerves: No cranial nerve deficit.  Psychiatric:        Behavior: Behavior normal.        Thought Content: Thought content normal.        Judgment: Judgment normal.   Assessment/Plan: 1. Hematuria, unspecified type U/A continues to be positive for blood. She will finish previously prescribed antibiotoics. Reviewed resultls of renal ultrasound. This did show evidence of nephrocalcinosis and was otherwise unremarkable. Will repeat ultrasound isn 6 to 6-58months for surveillance.  - POCT  Urinalysis Dipstick - CULTURE, URINE COMPREHENSIVE  2. Vitamin D deficiency - ergocalciferol (DRISDOL) 1.25 MG (50000 UT) capsule; Take 1 capsule (50,000 Units total) by mouth once a week.  Dispense: 4 capsule; Refill: 5  3. Essential hypertension Stable. Continue bp medication as prescribed   4. Mixed hyperlipidemia Reviewed lipid panel showing moderate elevation of triglycerides with normal LDL, HDL, and total cholesterol levels. Reviewed heart healthy diet. Repeat next year.   General Counseling: brecklynn cure understanding of the findings of todays visit and agrees with plan of treatment. I have discussed any further diagnostic evaluation that may be needed or ordered today. We also reviewed her medications today. she has been encouraged to call the office with any questions or concerns that should arise related to todays visit.  This patient was seen by Leretha Pol FNP Collaboration with Dr Lavera Guise as a part of collaborative care agreement  Orders Placed This Encounter  Procedures  . CULTURE, URINE COMPREHENSIVE  . POCT Urinalysis Dipstick    Meds ordered this encounter  Medications  . ergocalciferol (DRISDOL) 1.25 MG (50000 UT) capsule    Sig: Take 1 capsule (50,000 Units total) by mouth once a week.    Dispense:  4 capsule    Refill:  5    Order Specific Question:   Supervising Provider    Answer:   Lavera Guise [id number]    Time spent: 15  Minutes      Dr Lavera Guise Internal medicine

## 2019-06-08 NOTE — Progress Notes (Signed)
I did repeat u/a at follow up visit. Still showing large blood. I am going to refer to urology. She did see Zara Council in 2017 and there ware no abnormalities then.

## 2019-06-08 NOTE — Progress Notes (Signed)
Hey. Can you let the patient know that her urine is still positive for large blood. Rather than wait on repeat ultrasound, I would like to go ahead and send her to urology for further evaluation. Dx is gross hematuria and nephrocalcinosis. Thank you. Marland Kitchen

## 2019-06-09 ENCOUNTER — Telehealth: Payer: Self-pay

## 2019-06-09 NOTE — Telephone Encounter (Signed)
Per Nira Conn let the patient know that her urine is still positive for large blood. Rather than wait on repeat ultrasound, I would like to go ahead and send her to urology for further evaluation. Message was sent to [facility] for referral.

## 2019-06-10 LAB — CULTURE, URINE COMPREHENSIVE

## 2019-06-23 NOTE — Progress Notes (Signed)
06/24/2019 10:15 AM   Wendy Garza Dec 19, 1945 [id number]  Referring provider: Ronnell Freshwater, NP [address] [place],  [place] [address]  Chief Complaint  Patient presents with  . Hematuria    HPI: Patient is a 73 -year-old female who presents today as a referral from their PCP, Ronnell Freshwater, NP, for unspecified hematuria.    Patient was found to have microscopic hematuria on 05/13/2018 with 11-30 RBC's/hpf.  Patient does have a prior history of microscopic hematuria.  She underwent a hematuria workup in 2017 with CTU and cystoscopy.  Findings NED.    She does not have a prior history of recurrent urinary tract infections, nephrolithiasis, trauma to the genitourinary tract or malignancies of the genitourinary tract.   She does not have a family medical history of nephrolithiasis, malignancies of the genitourinary tract or hematuria.   Today, she is having symptoms of urgency, nocturia x 2-3 and incontinence (mild stress incontinence).  These are baseline symptoms.  Her UA today demonstrates 3-10 RBC's.    Patient denies any gross hematuria, dysuria or suprapubic/flank pain.  Patient denies any fevers, chills, nausea or vomiting.    She is a smoker - 40 years - 4 to 5 cigarettes a day      PMH: Past Medical History:  Diagnosis Date  . Asthma   . Cancer [facility]) 2003   UTERINE, total Hysterectomy  . COPD (chronic obstructive pulmonary disease) ([place])   . Depression   . Hyperlipidemia   . Hypertension   . Shortness of breath dyspnea    DOE    Surgical History: Past Surgical History:  Procedure Laterality Date  . ABDOMINAL HYSTERECTOMY    . BREAST SURGERY    . CATARACT EXTRACTION W/PHACO Left 10/04/2015   Procedure: CATARACT EXTRACTION PHACO AND INTRAOCULAR LENS PLACEMENT (I[place]);  Surgeon: Birder Robson, MD;  Location: [facility] ORS;  Service: Ophthalmology;  Laterality: Left;  [place]: 00:46.8 AP%: 21.7 CDE: 10.19 Lot # [id number]  . CATARACT EXTRACTION W/PHACO  Right 10/25/2015   Procedure: CATARACT EXTRACTION PHACO AND INTRAOCULAR LENS PLACEMENT ([place]);  Surgeon: Birder Robson, MD;  Location: [facility] ORS;  Service: Ophthalmology;  Laterality: Right;  [place] 00:48 AP% 25.2 CDE 12.16 fluid pack lot # [id number]  . COLONOSCOPY WITH PROPOFOL N/A 05/28/2016   Procedure: COLONOSCOPY WITH PROPOFOL;  Surgeon: Manya Silvas, MD;  Location: [facility] ENDOSCOPY;  Service: Endoscopy;  Laterality: N/A;    Home Medications:  Allergies as of 06/24/2019      Reactions   Aspirin    Nickel Rash      Medication List       Accurate as of June 24, 2019 10:15 AM. If you have any questions, ask your nurse or doctor.        STOP taking these medications   ciprofloxacin 500 MG tablet Commonly known as: Cipro Stopped by: Jaaron Oleson, PA-C   fluticasone 50 MCG/ACT nasal spray Commonly known as: FLONASE Stopped by: Royanne Warshaw, PA-C   PEG 3350 17 GM/SCOOP Powd Stopped by: Marguerite Jarboe, PA-C     TAKE these medications   albuterol 108 (90 Base) MCG/ACT inhaler Commonly known as: VENTOLIN HFA Inhale 2 puffs into the lungs every 6 (six) hours as needed for wheezing or shortness of breath. Reported on 11/10/2015   budesonide-formoterol 80-4.5 MCG/ACT inhaler Commonly known as: SYMBICORT Inhale 2 puffs into the lungs 2 (two) times daily.   ergocalciferol 1.25 MG (50000 UT) capsule Commonly known as: Drisdol Take 1 capsule (50,000  Units total) by mouth once a week.   meloxicam 15 MG tablet Commonly known as: MOBIC Take 1 tablet (15 mg total) by mouth daily.   metoprolol tartrate 50 MG tablet Commonly known as: LOPRESSOR TAKE 1/2 OF A TABLET BY MOUTH TWICE A DAY   rosuvastatin 20 MG tablet Commonly known as: CRESTOR TAKE 1 TABLET BY MOUTH DAILY FOR HIGH CHOLESTEROL   sertraline 50 MG tablet Commonly known as: ZOLOFT Take 1 tablet (50 mg total) by mouth daily.       Allergies:  Allergies  Allergen Reactions  . Aspirin   . Nickel Rash     Family History: Family History  Problem Relation Age of Onset  . Kidney disease Mother   . Kidney cancer Neg Hx   . Prostate cancer Neg Hx     Social History:  reports that she has been smoking cigarettes. She has a 25.00 pack-year smoking history. She has never used smokeless tobacco. She reports that she does not drink alcohol or use drugs.  ROS: UROLOGY Frequent Urination?: No Hard to postpone urination?: No Burning/pain with urination?: No Get up at night to urinate?: No Leakage of urine?: No Urine stream starts and stops?: No Trouble starting stream?: No Do you have to strain to urinate?: No Blood in urine?: Yes Urinary tract infection?: No Sexually transmitted disease?: No Injury to kidneys or bladder?: No Painful intercourse?: No Weak stream?: No Currently pregnant?: No Vaginal bleeding?: No Last menstrual period?: n  Gastrointestinal Nausea?: No Vomiting?: No Indigestion/heartburn?: No Diarrhea?: No Constipation?: No  Constitutional Fever: No Night sweats?: No Weight loss?: No Fatigue?: No  Skin Skin rash/lesions?: No Itching?: No  Eyes Blurred vision?: No Double vision?: No  Ears/Nose/Throat Sore throat?: No Sinus problems?: No  Hematologic/Lymphatic Swollen glands?: No Easy bruising?: No  Cardiovascular Leg swelling?: No Chest pain?: No  Respiratory Cough?: No Shortness of breath?: No  Endocrine Excessive thirst?: No  Musculoskeletal Back pain?: No Joint pain?: No  Neurological Headaches?: No Dizziness?: No  Psychologic Depression?: No Anxiety?: No  Physical Exam: BP 136/80   Pulse 98   Ht 5\' 4"  (1.626 m)   Wt 160 lb (72.6 kg)   LMP  (LMP Unknown)   BMI 27.46 kg/m   Constitutional:  Well nourished. Alert and oriented, No acute distress. HEENT: [place] AT, moist mucus membranes.  Trachea midline, no masses. Cardiovascular: No clubbing, cyanosis, or edema. Respiratory: Normal respiratory effort, no increased work of  breathing. GI: Abdomen is soft, non tender, non distended, no abdominal masses. Liver and spleen not palpable.  No hernias appreciated.  Stool sample for occult testing is not indicated.   GU: No CVA tenderness.  No bladder fullness or masses.  Atrophic external genitalia, sparse pubic hair distribution, no lesions.  Normal urethral meatus, no lesions, no prolapse, no discharge.   No urethral masses, tenderness and/or tenderness. No bladder fullness, tenderness or masses. Pale vagina mucosa, poor estrogen effect, no discharge, no lesions, good pelvic support, no cystocele and no rectocele noted.  Cervix, uterus and adnexa are surgically absent.  Anus and perineum are without rashes or lesions.    Skin: No rashes, bruises or suspicious lesions. Lymph: No inguinal adenopathy. Neurologic: Grossly intact, no focal deficits, moving all 4 extremities. Psychiatric: Normal mood and affect.   Laboratory Data: Lab Results  Component Value Date   WBC 8.4 05/21/2019   HGB 14.0 05/21/2019   HCT 41.6 05/21/2019   MCV 97 05/21/2019   PLT 299 05/21/2019  Lab Results  Component Value Date   CREATININE 0.87 05/21/2019    No results found for: PSA  No results found for: TESTOSTERONE  No results found for: HGBA1C  Lab Results  Component Value Date   TSH 4.540 (H) 05/21/2019       Component Value Date/Time   CHOL 122 05/21/2019 0912   HDL 24 (L) 05/21/2019 0912   CHOLHDL 4.4 05/22/2018 0718   LDLCALC 61 05/21/2019 0912    Lab Results  Component Value Date   AST 21 05/21/2019   Lab Results  Component Value Date   ALT 16 05/21/2019   No components found for: ALKALINEPHOPHATASE No components found for: BILIRUBINTOTAL  No results found for: ESTRADIOL   Urinalysis Component     Latest Ref Rng & Units 06/24/2019  Specific Gravity, UA     1.005 - 1.030 1.025  pH, UA     5.0 - 7.5 6.0  Color, UA     Yellow Yellow  Appearance Ur     Clear Clear  Leukocytes,UA     Negative  Negative  Protein,UA     Negative/Trace Negative  Glucose, UA     Negative Negative  Ketones, UA     Negative Negative  RBC, UA     Negative 2+ (A)  Bilirubin, UA     Negative Negative  Urobilinogen, Ur     0.2 - 1.0 mg/dL 0.2  Nitrite, UA     Negative Negative  Microscopic Examination      See below:   Component     Latest Ref Rng & Units 06/24/2019  WBC, UA     0 - 5 /hpf 0-5  RBC     0 - 2 /hpf 3-10 (A)  Epithelial Cells (non renal)     0 - 10 /hpf 0-10  Bacteria, UA     None seen/Few Moderate (A)    I have reviewed the labs.  Assessment & Plan:    1. Microscopic hematuria Explained to the patient that there are a number of causes that can be associated with blood in the urine, such as stones, UTI's, damage to the urinary tract and/or cancer. At this time, I felt that the patient warranted further urologic evaluation.   The AUA guidelines state that a CT urogram is the preferred imaging study to evaluate hematuria. I explained to the patient that a contrast material will be injected into a vein and that in rare instances, an allergic reaction can result and may even life threatening   The patient denies any allergies to contrast, iodine and/or seafood and is not taking metformin. Following the imaging study,  I've recommended a cystoscopy. I described how this is performed, typically in an office setting with a flexible cystoscope. We described the risks, benefits, and possible side effects, the most common of which is a minor amount of blood in the urine and/or burning which usually resolves in 24 to 48 hours.   The patient had the opportunity to ask questions which were answered. Based upon this discussion, the patient is willing to proceed. Therefore, I've ordered: a CT Urogram and cystoscopy.  The patient will return following all of the above for discussion of the results.  UA  Urine culture  BUN + creatinine     Return for CT Urogram report and cystoscopy.   These notes generated with voice recognition software. I apologize for typographical errors.  Zara Council, [facility] [address]  [place], [place] [address] [phone]

## 2019-06-24 ENCOUNTER — Other Ambulatory Visit: Payer: Self-pay

## 2019-06-24 ENCOUNTER — Ambulatory Visit: Payer: PPO | Admitting: Urology

## 2019-06-24 ENCOUNTER — Encounter: Payer: Self-pay | Admitting: Urology

## 2019-06-24 VITALS — BP 136/80 | HR 98 | Ht 64.0 in | Wt 160.0 lb

## 2019-06-24 DIAGNOSIS — R3129 Other microscopic hematuria: Secondary | ICD-10-CM | POA: Diagnosis not present

## 2019-06-24 DIAGNOSIS — R319 Hematuria, unspecified: Secondary | ICD-10-CM | POA: Diagnosis not present

## 2019-06-24 LAB — URINALYSIS, COMPLETE
Bilirubin, UA: NEGATIVE
Glucose, UA: NEGATIVE
Ketones, UA: NEGATIVE
Leukocytes,UA: NEGATIVE
Nitrite, UA: NEGATIVE
Protein,UA: NEGATIVE
Specific Gravity, UA: 1.025 (ref 1.005–1.030)
Urobilinogen, Ur: 0.2 mg/dL (ref 0.2–1.0)
pH, UA: 6 (ref 5.0–7.5)

## 2019-06-24 LAB — MICROSCOPIC EXAMINATION

## 2019-06-24 NOTE — Patient Instructions (Addendum)
Hematuria, Adult Hematuria is blood in the urine. Blood may be visible in the urine, or it may be identified with a test. This condition can be caused by infections of the bladder, urethra, kidney, or prostate. Other possible causes include:  Kidney stones.  Cancer of the urinary tract.  Too much calcium in the urine.  Conditions that are passed from parent to child (inherited conditions).  Exercise that requires a lot of energy. Infections can usually be treated with medicine, and a kidney stone usually will pass through your urine. If neither of these is the cause of your hematuria, more tests may be needed to identify the cause of your symptoms. It is very important to tell your health care provider about any blood in your urine, even if it is painless or the blood stops without treatment. Blood in the urine, when it happens and then stops and then happens again, can be a symptom of a very serious condition, including cancer. There is no pain in the initial stages of many urinary cancers. Follow these instructions at home: Medicines  Take over-the-counter and prescription medicines only as told by your health care provider.  If you were prescribed an antibiotic medicine, take it as told by your health care provider. Do not stop taking the antibiotic even if you start to feel better. Eating and drinking  Drink enough fluid to keep your urine clear or pale yellow. It is recommended that you drink 3-4 quarts (2.8-3.8 L) a day. If you have been diagnosed with an infection, it is recommended that you drink cranberry juice in addition to large amounts of water.  Avoid caffeine, tea, and carbonated beverages. These tend to irritate the bladder.  Avoid alcohol because it may irritate the prostate (men). General instructions  If you have been diagnosed with a kidney stone, follow your health care provider's instructions about straining your urine to catch the stone.  Empty your bladder  often. Avoid holding urine for long periods of time.  If you are female: ? After a bowel movement, wipe from front to back and use each piece of toilet paper only once. ? Empty your bladder before and after sex.  Pay attention to any changes in your symptoms. Tell your health care provider about any changes or any new symptoms.  It is your responsibility to get your test results. Ask your health care provider, or the department performing the test, when your results will be ready.  Keep all follow-up visits as told by your health care provider. This is important. Contact a health care provider if:  You develop back pain.  You have a fever.  You have nausea or vomiting.  Your symptoms do not improve after 3 days.  Your symptoms get worse. Get help right away if:  You develop severe vomiting and are unable take medicine without vomiting.  You develop severe pain in your back or abdomen even though you are taking medicine.  You pass a large amount of blood in your urine.  You pass blood clots in your urine.  You feel very weak or like you might faint.  You faint. Summary  Hematuria is blood in the urine. It has many possible causes.  It is very important that you tell your health care provider about any blood in your urine, even if it is painless or the blood stops without treatment.  Take over-the-counter and prescription medicines only as told by your health care provider.  Drink enough fluid to keep   your urine clear or pale yellow. This information is not intended to replace advice given to you by your health care provider. Make sure you discuss any questions you have with your health care provider. Document Released: 08/13/2005 Document Revised: 07/26/2017 Document Reviewed: 09/15/2016 Elsevier Patient Education  2020 [employer].  Cystoscopy Cystoscopy is a procedure that is used to help diagnose and sometimes treat conditions that affect the lower urinary tract.  The lower urinary tract includes the bladder and the urethra. The urethra is the tube that drains urine from the bladder. Cystoscopy is done using a thin, tube-shaped instrument with a light and camera at the end (cystoscope). The cystoscope may be hard or flexible, depending on the goal of the procedure. The cystoscope is inserted through the urethra, into the bladder. Cystoscopy may be recommended if you have:  Urinary tract infections that keep coming back.  Blood in the urine (hematuria).  An inability to control when you urinate (urinary incontinence) or an overactive bladder.  Unusual cells found in a urine sample.  A blockage in the urethra, such as a urinary stone.  Painful urination.  An abnormality in the bladder found during an intravenous pyelogram (IVP) or CT scan. Cystoscopy may also be done to remove a sample of tissue to be examined under a microscope (biopsy). Tell a health care provider about:  Any allergies you have.  All medicines you are taking, including vitamins, herbs, eye drops, creams, and over-the-counter medicines.  Any problems you or family members have had with anesthetic medicines.  Any blood disorders you have.  Any surgeries you have had.  Any medical conditions you have.  Whether you are pregnant or may be pregnant. What are the risks? Generally, this is a safe procedure. However, problems may occur, including:  Infection.  Bleeding.  Allergic reactions to medicines.  Damage to other structures or organs. What happens before the procedure?  Ask your health care provider about: ? Changing or stopping your regular medicines. This is especially important if you are taking diabetes medicines or blood thinners. ? Taking medicines such as aspirin and ibuprofen. These medicines can thin your blood. Do not take these medicines unless your health care provider tells you to take them. ? Taking over-the-counter medicines, vitamins, herbs, and  supplements.  Follow instructions from your health care provider about eating or drinking restrictions.  Ask your health care provider what steps will be taken to help prevent infection. These may include: ? Washing skin with a germ-killing soap. ? Taking antibiotic medicine.  You may have an exam or testing, such as: ? X-rays of the bladder, urethra, or kidneys. ? Urine tests to check for signs of infection.  Plan to have someone take you home from the hospital or clinic. What happens during the procedure?   You will be given one or more of the following: ? A medicine to help you relax (sedative). ? A medicine to numb the area (local anesthetic).  The area around the opening of your urethra will be cleaned.  The cystoscope will be passed through your urethra into your bladder.  Germ-free (sterile) fluid will flow through the cystoscope to fill your bladder. The fluid will stretch your bladder so that your health care provider can clearly examine your bladder walls.  Your doctor will look at the urethra and bladder. Your doctor may take a biopsy or remove stones.  The cystoscope will be removed, and your bladder will be emptied. The procedure may vary among health  care providers and hospitals. What can I expect after the procedure? After the procedure, it is common to have:  Some soreness or pain in your abdomen and urethra.  Urinary symptoms. These include: ? Mild pain or burning when you urinate. Pain should stop within a few minutes after you urinate. This may last for up to 1 week. ? A small amount of blood in your urine for several days. ? Feeling like you need to urinate but producing only a small amount of urine. Follow these instructions at home: Medicines  Take over-the-counter and prescription medicines only as told by your health care provider.  If you were prescribed an antibiotic medicine, take it as told by your health care provider. Do not stop taking the  antibiotic even if you start to feel better. General instructions  Return to your normal activities as told by your health care provider. Ask your health care provider what activities are safe for you.  Do not drive for 24 hours if you were given a sedative during your procedure.  Watch for any blood in your urine. If the amount of blood in your urine increases, call your health care provider.  Follow instructions from your health care provider about eating or drinking restrictions.  If a tissue sample was removed for testing (biopsy) during your procedure, it is up to you to get your test results. Ask your health care provider, or the department that is doing the test, when your results will be ready.  Drink enough fluid to keep your urine pale yellow.  Keep all follow-up visits as told by your health care provider. This is important. Contact a health care provider if you:  Have pain that gets worse or does not get better with medicine, especially pain when you urinate.  Have trouble urinating.  Have more blood in your urine. Get help right away if you:  Have blood clots in your urine.  Have abdominal pain.  Have a fever or chills.  Are unable to urinate. Summary  Cystoscopy is a procedure that is used to help diagnose and sometimes treat conditions that affect the lower urinary tract.  Cystoscopy is done using a thin, tube-shaped instrument with a light and camera at the end.  After the procedure, it is common to have some soreness or pain in your abdomen and urethra.  Watch for any blood in your urine. If the amount of blood in your urine increases, call your health care provider.  If you were prescribed an antibiotic medicine, take it as told by your health care provider. Do not stop taking the antibiotic even if you start to feel better. This information is not intended to replace advice given to you by your health care provider. Make sure you discuss any questions you  have with your health care provider. Document Released: 08/10/2000 Document Revised: 08/05/2018 Document Reviewed: 08/05/2018 Elsevier Patient Education  [place].  CT Scan  A CT scan (computed tomography scan) is an imaging scan. It uses X-rays and a computer to make detailed pictures of different areas inside the body. A CT scan can give more information than a regular X-ray exam. A CT scan provides data about internal organs, soft tissue structures, blood vessels, and bones. In this procedure, the pictures will be taken in a large machine that has an opening (CT scanner). Tell a health care provider about:  Any allergies you have.  All medicines you are taking, including vitamins, herbs, eye drops, creams, and  over-the-counter medicines.  Any blood disorders you have.  Any surgeries you have had.  Any medical conditions you have.  Whether you are pregnant or may be pregnant. What are the risks? Generally, this is a safe procedure. However, problems may occur, including:  An allergic reaction to dyes.  Development of cancer from excessive exposure to radiation from multiple CT scans. This is rare. What happens before the procedure? Staying hydrated Follow instructions from your health care provider about hydration, which may include:  Up to 2 hours before the procedure - you may continue to drink clear liquids, such as water, clear fruit juice, black coffee, and plain tea. Eating and drinking restrictions Follow instructions from your health care provider about eating and drinking, which may include:  24 hours before the procedure - stop drinking caffeinated beverages, such as energy drinks, tea, soda, coffee, and hot chocolate.  8 hours before the procedure - stop eating heavy meals or foods such as meat, fried foods, or fatty foods.  6 hours before the procedure - stop eating light meals or foods, such as toast or cereal.  6 hours before the procedure - stop  drinking milk or drinks that contain milk.  2 hours before the procedure - stop drinking clear liquids. General instructions  Remove any jewelry.  Ask your health care provider about changing or stopping your regular medicines. This is especially important if you are taking diabetes medicines or blood thinners. What happens during the procedure?  You will lie on a table with your arms above your head.  An IV tube may be inserted into one of your veins.  The contrast dye may be injected into the IV tube. You may feel warm or have a metallic taste in your mouth.  The table you will be lying on will move into the CT scanner.  You will be able to see, hear, and talk to the person running the machine while you are in it. Follow that person's instructions.  The CT scanner will move around you to take pictures. Do not move while it is scanning. Staying still helps the scanner to get a good image.  When the best possible pictures have been taken, the machine will be turned off. The table will be moved out of the machine.  The IV tube will be removed. The procedure may vary among health care providers and hospitals. What happens after the procedure?  It is up to you to get the results of your procedure. Ask your health care provider, or the department that is doing the procedure, when your results will be ready. Summary  A CT scan is an imaging scan.  A CT scan uses X-rays and a computer to make detailed pictures of different areas of your body.  Follow instructions from your health care provider about eating and drinking before the procedure.  You will be able to see, hear, and talk to the person running the machine while you are in it. Follow that person's instructions. This information is not intended to replace advice given to you by your health care provider. Make sure you discuss any questions you have with your health care provider. Document Released: 09/20/2004 Document Revised:  09/15/2016 Document Reviewed: 09/15/2016 Elsevier Patient Education  2020 [employer].

## 2019-06-25 LAB — BUN+CREAT
BUN/Creatinine Ratio: 18 (ref 12–28)
BUN: 17 mg/dL (ref 8–27)
Creatinine, Ser: 0.92 mg/dL (ref 0.57–1.00)
GFR calc Af Amer: 72 mL/min/{1.73_m2} (ref >59)
GFR calc non Af Amer: 63 mL/min/{1.73_m2} (ref >59)

## 2019-06-27 LAB — CULTURE, URINE COMPREHENSIVE

## 2019-07-09 ENCOUNTER — Other Ambulatory Visit: Payer: Self-pay

## 2019-07-09 ENCOUNTER — Ambulatory Visit
Admission: RE | Admit: 2019-07-09 | Discharge: 2019-07-09 | Disposition: A | Discharge status: Home / Self Care | Payer: PPO | Source: Ambulatory Visit | Attending: Urology | Admitting: Urology

## 2019-07-09 DIAGNOSIS — R3129 Other microscopic hematuria: Secondary | ICD-10-CM | POA: Insufficient documentation

## 2019-07-09 MED ORDER — IOHEXOL 300 MG/ML  SOLN
125.0000 mL | Freq: Once | INTRAMUSCULAR | Status: AC | PRN
  Administered 2019-07-09: 125 mL via INTRAVENOUS

## 2019-07-21 ENCOUNTER — Ambulatory Visit: Payer: PPO | Admitting: Urology

## 2019-07-21 ENCOUNTER — Encounter: Payer: Self-pay | Admitting: Urology

## 2019-07-21 ENCOUNTER — Other Ambulatory Visit: Payer: Self-pay

## 2019-07-21 VITALS — BP 176/91 | HR 88 | Wt 161.0 lb

## 2019-07-21 DIAGNOSIS — R3129 Other microscopic hematuria: Secondary | ICD-10-CM | POA: Diagnosis not present

## 2019-07-21 DIAGNOSIS — N329 Bladder disorder, unspecified: Secondary | ICD-10-CM

## 2019-07-21 NOTE — Progress Notes (Signed)
   07/21/19  CC:  Chief Complaint  Patient presents with  . Cysto    HPI: 73 yo smoker who presents today for cystoscopy.    CT urogram on 07/09/19 for microscopic hematuria shows no GU pathology.  She does have persistent microscopic blood in her urine today.  She is otherwise asymptomatic.  Blood pressure (!) 176/91, pulse 88, weight 161 lb (73 kg). NED. A&Ox3.   No respiratory distress   Abd soft, NT, ND Normal external genitalia with patent urethral meatus  Cystoscopy Procedure Note  Patient identification was confirmed, informed consent was obtained, and patient was prepped using Betadine solution.  Lidocaine jelly was administered per urethral meatus.    Procedure: - Flexible cystoscope introduced, without any difficulty.   - Thorough search of the bladder revealed:    normal urethral meatus    normal urothelium    no stones    no ulcers     spherical lesion at dome measuring ~2 mm    no urethral polyps    no trabeculation  - Ureteral orifices were normal in position and appearance.  Post-Procedure: - Patient tolerated the procedure well  Assessment/ Plan:  1. Lesion of bladder Very small lesion involving the dome of the bladder, ?inflammatiory versus urachal remnant versus early malignancy  Most strongly recommended cystoscopy with bladder biopsy of this lesion.  Risk of the procedure were discussed in detail including risk of bleeding, infection, damage surrounding structures, bladder discomfort amongst others.  All questions answered.  She is willing to proceed as planned.   2. Microscopic hematuria As above - Urinalysis, Complete   Hollice Espy, MD

## 2019-07-22 LAB — URINALYSIS, COMPLETE
Bilirubin, UA: NEGATIVE
Glucose, UA: NEGATIVE
Ketones, UA: NEGATIVE
Leukocytes,UA: NEGATIVE
Nitrite, UA: NEGATIVE
Protein,UA: NEGATIVE
Specific Gravity, UA: 1.025 (ref 1.005–1.030)
Urobilinogen, Ur: 0.2 mg/dL (ref 0.2–1.0)
pH, UA: 5.5 (ref 5.0–7.5)

## 2019-07-22 LAB — MICROSCOPIC EXAMINATION: Bacteria, UA: NONE SEEN

## 2019-07-27 ENCOUNTER — Other Ambulatory Visit: Payer: Self-pay | Admitting: Radiology

## 2019-07-27 DIAGNOSIS — N329 Bladder disorder, unspecified: Secondary | ICD-10-CM

## 2019-08-11 ENCOUNTER — Other Ambulatory Visit: Payer: Self-pay

## 2019-08-11 MED ORDER — ROSUVASTATIN CALCIUM 20 MG PO TABS: ORAL_TABLET | ORAL | 1 refills | Status: DC

## 2019-08-19 ENCOUNTER — Encounter
Admission: RE | Admit: 2019-08-19 | Discharge: 2019-08-19 | Disposition: A | Discharge status: Home / Self Care | Payer: PPO | Source: Ambulatory Visit | Attending: Urology | Admitting: Urology

## 2019-08-19 ENCOUNTER — Other Ambulatory Visit: Payer: Self-pay

## 2019-08-19 HISTORY — DX: Unspecified osteoarthritis, unspecified site: M19.90

## 2019-08-19 NOTE — Patient Instructions (Signed)
Your procedure is scheduled on: 08-30-18 MONDAY Report to Same Day Surgery 2nd floor medical mall [facility] Entrance-take elevator on left to 2nd floor.  Check in with surgery information desk.) To find out your arrival time please call [phone] between 1PM - 3PM on 08-27-19 THURSDAY  Remember: Instructions that are not followed completely may result in serious medical risk, up to and including death, or upon the discretion of your surgeon and anesthesiologist your surgery may need to be rescheduled.    _x___ 1. Do not eat food after midnight the night before your procedure. NO GUM OR CANDY AFTER MIDNIGHT. You may drink clear liquids up to 2 hours before you are scheduled to arrive at the hospital for your procedure.  Do not drink clear liquids within 2 hours of your scheduled arrival to the hospital.  Clear liquids include  --Water or Apple juice without pulp  --Gatorade  --Black Coffee or Clear Tea (No milk, no creamers, do not add anything to  the coffee or Tea   ____Ensure clear carbohydrate drink on the way to the hospital for bariatric patients  ____Ensure clear carbohydrate drink 3 hours before surgery.    __x__ 2. No Alcohol for 24 hours before or after surgery.   __x__3. No Smoking or e-cigarettes for 24 prior to surgery.  Do not use any chewable tobacco products for at least 6 hour prior to surgery   ____  4. Bring all medications with you on the day of surgery if instructed.    __x__ 5. Notify your doctor if there is any change in your medical condition     (cold, fever, infections).    x___6. On the morning of surgery brush your teeth with toothpaste and water.  You may rinse your mouth with mouth wash if you wish.  Do not swallow any toothpaste or mouthwash.   Do not wear jewelry, make-up, hairpins, clips or nail polish.  Do not wear lotions, powders, or perfumes. You may wear deodorant.  Do not shave 48 hours prior to surgery. Men may shave face and neck.  Do  not bring valuables to the hospital.    [facility] is not responsible for any belongings or valuables.               Contacts, dentures or bridgework may not be worn into surgery.  Leave your suitcase in the car. After surgery it may be brought to your room.  For patients admitted to the hospital, discharge time is determined by your treatment team.  _  Patients discharged the day of surgery will not be allowed to drive home.  You will need someone to drive you home and stay with you the night of your procedure.    Please read over the following fact sheets that you were given:   [facility] Preparing for Surgery and or MRSA Information   _x___ TAKE THE FOLLOWING MEDICATION THE MORNING OF SURGERY WITH A SMALL SIP OF WATER. These include:  1. METOPROLOL   2. SERTRALINE (ZOLOFT)  3.  4.  5.  6.  ____Fleets enema or Magnesium Citrate as directed.   ____ Use CHG Soap or sage wipes as directed on instruction sheet   _X___ Use inhalers on the day of surgery and bring to hospital day of surgery-USE SYMBICORT AND ALBUTEROL INHALER DAY OF SURGERY AND BRING ALBUTEROL INHALER DAY OF SURGERY  ____ Stop Metformin and Janumet 2 days prior to surgery.    ____ Take 1/2 of  usual insulin dose the night before surgery and none on the morning surgery.   ____ Follow recommendations from Cardiologist, Pulmonologist or PCP regarding stopping Aspirin, Coumadin, Plavix ,Eliquis, Effient, or Pradaxa, and Pletal.  X____Stop Anti-inflammatories such as Advil, Aleve, Ibuprofen, Motrin, Naproxen, Naprosyn, Goodies powders or aspirin products 7 DAYS PRIOR TO SURGERY-OK to take Tylenol    ____ Stop supplements until after surgery.    ____ Bring C-Pap to the hospital.

## 2019-08-24 ENCOUNTER — Other Ambulatory Visit: Payer: PPO

## 2019-08-27 ENCOUNTER — Other Ambulatory Visit: Payer: Self-pay

## 2019-08-27 ENCOUNTER — Other Ambulatory Visit: Payer: PPO

## 2019-08-27 ENCOUNTER — Encounter
Admission: RE | Admit: 2019-08-27 | Discharge: 2019-08-27 | Disposition: A | Discharge status: Home / Self Care | Payer: PPO | Source: Ambulatory Visit | Attending: Urology | Admitting: Urology

## 2019-08-27 DIAGNOSIS — Z20828 Contact with and (suspected) exposure to other viral communicable diseases: Secondary | ICD-10-CM | POA: Insufficient documentation

## 2019-08-27 DIAGNOSIS — I10 Essential (primary) hypertension: Secondary | ICD-10-CM | POA: Diagnosis not present

## 2019-08-27 DIAGNOSIS — Z01818 Encounter for other preprocedural examination: Secondary | ICD-10-CM | POA: Diagnosis not present

## 2019-08-27 LAB — URINALYSIS, ROUTINE W REFLEX MICROSCOPIC
Bilirubin Urine: NEGATIVE
Glucose, UA: NEGATIVE mg/dL
Ketones, ur: NEGATIVE mg/dL
Nitrite: NEGATIVE
Protein, ur: NEGATIVE mg/dL
Specific Gravity, Urine: 1.02 (ref 1.005–1.030)
pH: 5 (ref 5.0–8.0)

## 2019-08-27 LAB — BASIC METABOLIC PANEL
Anion gap: 8 (ref 5–15)
BUN: 18 mg/dL (ref 8–23)
CO2: 24 mmol/L (ref 22–32)
Calcium: 9 mg/dL (ref 8.9–10.3)
Chloride: 107 mmol/L (ref 98–111)
Creatinine, Ser: 0.76 mg/dL (ref 0.44–1.00)
GFR calc Af Amer: >60 mL/min (ref >60)
GFR calc non Af Amer: >60 mL/min (ref >60)
Glucose, Bld: 105 mg/dL — ABNORMAL HIGH (ref 70–99)
Potassium: 4.5 mmol/L (ref 3.5–5.1)
Sodium: 139 mmol/L (ref 135–145)

## 2019-08-27 LAB — CBC
HCT: 42.8 % (ref 36.0–46.0)
Hemoglobin: 14 g/dL (ref 12.0–15.0)
MCH: 31 pg (ref 26.0–34.0)
MCHC: 32.7 g/dL (ref 30.0–36.0)
MCV: 94.9 fL (ref 80.0–100.0)
Platelets: 291 10*3/uL (ref 150–400)
RBC: 4.51 MIL/uL (ref 3.87–5.11)
RDW: 14.3 % (ref 11.5–15.5)
WBC: 9.3 10*3/uL (ref 4.0–10.5)
nRBC: 0 % (ref 0.0–0.2)

## 2019-08-27 LAB — SARS CORONAVIRUS 2 (TAT 6-24 HRS): SARS Coronavirus 2: NEGATIVE

## 2019-08-31 ENCOUNTER — Ambulatory Visit
Admission: RE | Admit: 2019-08-31 | Discharge: 2019-08-31 | Disposition: A | Discharge status: Home / Self Care | Payer: PPO | Attending: Urology | Admitting: Urology

## 2019-08-31 ENCOUNTER — Ambulatory Visit: Payer: PPO | Admitting: Certified Registered Nurse Anesthetist

## 2019-08-31 ENCOUNTER — Other Ambulatory Visit: Payer: Self-pay

## 2019-08-31 ENCOUNTER — Encounter
Admission: RE | Disposition: A | Discharge status: Home / Self Care | Payer: Self-pay | Source: Home / Self Care | Attending: Urology

## 2019-08-31 ENCOUNTER — Encounter: Payer: Self-pay | Admitting: Urology

## 2019-08-31 DIAGNOSIS — R3129 Other microscopic hematuria: Secondary | ICD-10-CM | POA: Diagnosis not present

## 2019-08-31 DIAGNOSIS — N329 Bladder disorder, unspecified: Secondary | ICD-10-CM

## 2019-08-31 DIAGNOSIS — Z7951 Long term (current) use of inhaled steroids: Secondary | ICD-10-CM | POA: Diagnosis not present

## 2019-08-31 DIAGNOSIS — F17200 Nicotine dependence, unspecified, uncomplicated: Secondary | ICD-10-CM | POA: Insufficient documentation

## 2019-08-31 DIAGNOSIS — I10 Essential (primary) hypertension: Secondary | ICD-10-CM | POA: Insufficient documentation

## 2019-08-31 DIAGNOSIS — F419 Anxiety disorder, unspecified: Secondary | ICD-10-CM | POA: Insufficient documentation

## 2019-08-31 DIAGNOSIS — E785 Hyperlipidemia, unspecified: Secondary | ICD-10-CM | POA: Insufficient documentation

## 2019-08-31 DIAGNOSIS — Z79899 Other long term (current) drug therapy: Secondary | ICD-10-CM | POA: Diagnosis not present

## 2019-08-31 DIAGNOSIS — Z8542 Personal history of malignant neoplasm of other parts of uterus: Secondary | ICD-10-CM | POA: Diagnosis not present

## 2019-08-31 DIAGNOSIS — J449 Chronic obstructive pulmonary disease, unspecified: Secondary | ICD-10-CM | POA: Diagnosis not present

## 2019-08-31 DIAGNOSIS — R3121 Asymptomatic microscopic hematuria: Secondary | ICD-10-CM | POA: Diagnosis not present

## 2019-08-31 DIAGNOSIS — N3289 Other specified disorders of bladder: Secondary | ICD-10-CM | POA: Diagnosis not present

## 2019-08-31 DIAGNOSIS — N3081 Other cystitis with hematuria: Secondary | ICD-10-CM | POA: Diagnosis not present

## 2019-08-31 DIAGNOSIS — F329 Major depressive disorder, single episode, unspecified: Secondary | ICD-10-CM | POA: Diagnosis not present

## 2019-08-31 DIAGNOSIS — E782 Mixed hyperlipidemia: Secondary | ICD-10-CM | POA: Diagnosis not present

## 2019-08-31 HISTORY — PX: CYSTOSCOPY WITH BIOPSY: SHX5122

## 2019-08-31 HISTORY — PX: CYSTOSCOPY WITH FULGERATION: SHX6638

## 2019-08-31 SURGERY — CYSTOSCOPY, WITH BIOPSY
Anesthesia: General | Site: Bladder

## 2019-08-31 MED ORDER — GLYCOPYRROLATE 0.2 MG/ML IJ SOLN
INTRAMUSCULAR | Status: DC | PRN
  Administered 2019-08-31: .2 mg via INTRAVENOUS

## 2019-08-31 MED ORDER — DEXAMETHASONE SODIUM PHOSPHATE 10 MG/ML IJ SOLN
INTRAMUSCULAR | Status: DC | PRN
  Administered 2019-08-31: 5 mg via INTRAVENOUS

## 2019-08-31 MED ORDER — PHENYLEPHRINE HCL (PRESSORS) 10 MG/ML IV SOLN
INTRAVENOUS | Status: DC | PRN
  Administered 2019-08-31 (×2): 100 ug via INTRAVENOUS

## 2019-08-31 MED ORDER — LIDOCAINE HCL (CARDIAC) PF 100 MG/5ML IV SOSY
PREFILLED_SYRINGE | INTRAVENOUS | Status: DC | PRN
  Administered 2019-08-31: 100 mg via INTRAVENOUS

## 2019-08-31 MED ORDER — FAMOTIDINE 20 MG PO TABS
ORAL_TABLET | ORAL | Status: AC
  Administered 2019-08-31: 20 mg via ORAL
  Filled 2019-08-31: qty 1

## 2019-08-31 MED ORDER — ONDANSETRON HCL 4 MG/2ML IJ SOLN: 4.0000 mg | Freq: Once | INTRAMUSCULAR | Status: DC | PRN

## 2019-08-31 MED ORDER — OXYBUTYNIN CHLORIDE 5 MG PO TABS
ORAL_TABLET | ORAL | Status: AC
  Administered 2019-08-31: 5 mg via ORAL
  Filled 2019-08-31: qty 1

## 2019-08-31 MED ORDER — PROPOFOL 10 MG/ML IV BOLUS
INTRAVENOUS | Status: AC
  Filled 2019-08-31: qty 40

## 2019-08-31 MED ORDER — IPRATROPIUM-ALBUTEROL 0.5-2.5 (3) MG/3ML IN SOLN
3.0000 mL | Freq: Once | RESPIRATORY_TRACT | Status: AC
  Administered 2019-08-31: 3 mL via RESPIRATORY_TRACT

## 2019-08-31 MED ORDER — IPRATROPIUM-ALBUTEROL 0.5-2.5 (3) MG/3ML IN SOLN
RESPIRATORY_TRACT | Status: AC
  Filled 2019-08-31: qty 3

## 2019-08-31 MED ORDER — CEFAZOLIN SODIUM-DEXTROSE 2-4 GM/100ML-% IV SOLN
INTRAVENOUS | Status: AC
  Filled 2019-08-31: qty 100

## 2019-08-31 MED ORDER — FENTANYL CITRATE (PF) 100 MCG/2ML IJ SOLN
INTRAMUSCULAR | Status: DC | PRN
  Administered 2019-08-31: 25 ug via INTRAVENOUS

## 2019-08-31 MED ORDER — ACETAMINOPHEN 10 MG/ML IV SOLN
INTRAVENOUS | Status: AC
  Filled 2019-08-31: qty 100

## 2019-08-31 MED ORDER — OXYBUTYNIN CHLORIDE 5 MG PO TABS: 5.0000 mg | ORAL_TABLET | Freq: Three times a day (TID) | ORAL | Status: DC | PRN

## 2019-08-31 MED ORDER — ONDANSETRON HCL 4 MG/2ML IJ SOLN
INTRAMUSCULAR | Status: DC | PRN
  Administered 2019-08-31: 4 mg via INTRAVENOUS

## 2019-08-31 MED ORDER — FENTANYL CITRATE (PF) 100 MCG/2ML IJ SOLN: 25.0000 ug | INTRAMUSCULAR | Status: DC | PRN

## 2019-08-31 MED ORDER — FAMOTIDINE 20 MG PO TABS: 20.0000 mg | ORAL_TABLET | Freq: Once | ORAL | Status: AC

## 2019-08-31 MED ORDER — LACTATED RINGERS IV SOLN: INTRAVENOUS | Status: DC

## 2019-08-31 MED ORDER — CEFAZOLIN SODIUM-DEXTROSE 2-4 GM/100ML-% IV SOLN
2.0000 g | INTRAVENOUS | Status: AC
  Administered 2019-08-31: 2 g via INTRAVENOUS

## 2019-08-31 MED ORDER — ACETAMINOPHEN 10 MG/ML IV SOLN
INTRAVENOUS | Status: DC | PRN
  Administered 2019-08-31: 1000 mg via INTRAVENOUS

## 2019-08-31 MED ORDER — FENTANYL CITRATE (PF) 100 MCG/2ML IJ SOLN
INTRAMUSCULAR | Status: AC
  Filled 2019-08-31: qty 2

## 2019-08-31 MED ORDER — OXYBUTYNIN CHLORIDE 5 MG PO TABS: 5.0000 mg | ORAL_TABLET | Freq: Three times a day (TID) | ORAL | 0 refills | Status: DC | PRN

## 2019-08-31 MED ORDER — PROPOFOL 10 MG/ML IV BOLUS
INTRAVENOUS | Status: DC | PRN
  Administered 2019-08-31: 150 mg via INTRAVENOUS
  Administered 2019-08-31: 50 mg via INTRAVENOUS
  Administered 2019-08-31: 30 mg via INTRAVENOUS

## 2019-08-31 SURGICAL SUPPLY — 18 items
BAG DRAIN CYSTO-URO LG1000N (MISCELLANEOUS) ×3 IMPLANT
BRUSH SCRUB EZ  4% CHG (MISCELLANEOUS) ×2
BRUSH SCRUB EZ 4% CHG (MISCELLANEOUS) ×1 IMPLANT
DRSG TELFA 4X3 1S NADH ST (GAUZE/BANDAGES/DRESSINGS) ×3 IMPLANT
ELECT REM PT RETURN 9FT ADLT (ELECTROSURGICAL) ×3
ELECTRODE REM PT RTRN 9FT ADLT (ELECTROSURGICAL) ×1 IMPLANT
GLOVE BIO SURGEON STRL SZ 6.5 (GLOVE) ×2 IMPLANT
GLOVE BIO SURGEONS STRL SZ 6.5 (GLOVE) ×1
GOWN STRL REUS W/ TWL LRG LVL3 (GOWN DISPOSABLE) ×2 IMPLANT
GOWN STRL REUS W/TWL LRG LVL3 (GOWN DISPOSABLE) ×4
KIT TURNOVER CYSTO (KITS) ×3 IMPLANT
NDL SAFETY ECLIPSE 18X1.5 (NEEDLE) ×1 IMPLANT
NEEDLE HYPO 18GX1.5 SHARP (NEEDLE) ×2
PACK CYSTO AR (MISCELLANEOUS) ×3 IMPLANT
SET CYSTO W/LG BORE CLAMP LF (SET/KITS/TRAYS/PACK) ×3 IMPLANT
SURGILUBE 2OZ TUBE FLIPTOP (MISCELLANEOUS) ×3 IMPLANT
WATER STERILE IRR 1000ML POUR (IV SOLUTION) ×3 IMPLANT
WATER STERILE IRR 3000ML UROMA (IV SOLUTION) ×3 IMPLANT

## 2019-08-31 NOTE — Transfer of Care (Signed)
Immediate Anesthesia Transfer of Care Note  Patient: Wendy Garza  Procedure(s) Performed: CYSTOSCOPY WITH bladder BIOPSY (N/A Bladder) CYSTOSCOPY WITH FULGERATION (N/A Bladder)  Patient Location: PACU  Anesthesia Type:General  Level of Consciousness: drowsy and patient cooperative  Airway & Oxygen Therapy: Patient Spontanous Breathing and Patient connected to face mask oxygen  Post-op Assessment: Report given to RN and Post -op Vital signs reviewed and stable  Post vital signs: Reviewed and stable  Last Vitals:  Vitals Value Taken Time  BP 94/39 08/31/19 0936  Temp 36.1 C 08/31/19 0936  Pulse 75 08/31/19 0941  Resp 16 08/31/19 0941  SpO2 97 % 08/31/19 0941  Vitals shown include unvalidated device data.  Last Pain:  Vitals:   08/31/19 0936  TempSrc:   PainSc: Asleep         Complications: No apparent anesthesia complications

## 2019-08-31 NOTE — Op Note (Signed)
Date of procedure: 08/31/19  Preoperative diagnosis:  1. Microscopic hematuria 2. Bladder lesion  Postoperative diagnosis:  1. Same as above  Procedure: 1. Cystoscopy 2. Bladder biopsy  Surgeon: Hollice Espy, MD  Anesthesia: General  Complications: None  Intraoperative findings: Small 2 mm rounded lesion at dome of bladder.  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  No other lesions.  Bladder otherwise unremarkable.  EBL: Minimal  Specimens: Bladder dome, bladder trigone biopsies  Drains: None  Indication: KETTY SPIZZIRRI is a 74 y.o. patient with microscopic hematuria found to have an incidental lesion at the dome of the bladder.  After reviewing the management options for treatment, she elected to proceed with the above surgical procedure(s). We have discussed the potential benefits and risks of the procedure, side effects of the proposed treatment, the likelihood of the patient achieving the goals of the procedure, and any potential problems that might occur during the procedure or recuperation. Informed consent has been obtained.  Description of procedure:  The patient was taken to the operating room and general anesthesia was induced.  The patient was placed in the dorsal lithotomy position, prepped and draped in the usual sterile fashion, and preoperative antibiotics were administered. A preoperative time-out was performed.   A 21 French scope was advanced per urethra into the bladder.  The bladder is carefully inspected.  This revealed the small lesion at the dome of the bladder in question, approximately 2 mm spherical nodular area which had somewhat of a benign appearance,?  Cystitis cystica.  There is also some extrinsic compression at this location as well but no other surrounding pathology.  The remainder of the bladder was unremarkable other than the trigone.  The trigone itself had a slightly inflamed appearance with some nodular  areas similar to the one found at the dome.  This is thought to be likely an inflammatory condition but was biopsied in addition to the area at the dome as a precaution.  Cold cup forceps were used to achieve this.  Careful and adequate hemostasis was achieved using Bugbee electrocautery.  The bladder was then drained.  The patient was then cleaned and dried, repositioned in supine position, versus myesthesia taken the PACU in stable condition.  Plan: Given the likely benign etiology, will call her with the results.  Follow-up to be determined based on these findings.  Hollice Espy, M.D.

## 2019-08-31 NOTE — Anesthesia Postprocedure Evaluation (Signed)
Anesthesia Post Note  Patient: Wendy Garza  Procedure(s) Performed: CYSTOSCOPY WITH bladder BIOPSY (N/A Bladder) CYSTOSCOPY WITH FULGERATION (N/A Bladder)  Patient location during evaluation: PACU Anesthesia Type: General Level of consciousness: awake and alert and oriented Pain management: pain level controlled Vital Signs Assessment: post-procedure vital signs reviewed and stable Respiratory status: spontaneous breathing, nonlabored ventilation and respiratory function stable Cardiovascular status: blood pressure returned to baseline and stable Postop Assessment: no signs of nausea or vomiting Anesthetic complications: no     Last Vitals:  Vitals:   08/31/19 0950 08/31/19 1005  BP: 125/64 107/63  Pulse: 86 93  Resp: 13 16  Temp:    SpO2: 98% 96%    Last Pain:  Vitals:   08/31/19 0950  TempSrc:   PainSc: 0-No pain                 Ayano Douthitt

## 2019-08-31 NOTE — Anesthesia Preprocedure Evaluation (Signed)
Anesthesia Evaluation  Patient identified by MRN, date of birth, ID band Patient awake    Reviewed: Allergy & Precautions, NPO status , Patient's Chart, lab work & pertinent test results  History of Anesthesia Complications Negative for: history of anesthetic complications  Airway Mallampati: II  TM Distance: >3 FB Neck ROM: Full    Dental no notable dental hx.    Pulmonary asthma , neg sleep apnea, COPD,  COPD inhaler, Current Smoker and Patient abstained from smoking.,    breath sounds clear to auscultation- rhonchi (-) wheezing      Cardiovascular hypertension, Pt. on medications (-) CAD, (-) Past MI, (-) Cardiac Stents and (-) CABG  Rhythm:Regular Rate:Normal - Systolic murmurs and - Diastolic murmurs    Neuro/Psych neg Seizures PSYCHIATRIC DISORDERS Anxiety Depression negative neurological ROS     GI/Hepatic negative GI ROS, Neg liver ROS,   Endo/Other  negative endocrine ROSneg diabetes  Renal/GU negative Renal ROS     Musculoskeletal  (+) Arthritis ,   Abdominal (+) - obese,   Peds  Hematology negative hematology ROS (+)   Anesthesia Other Findings Past Medical History: No date: Arthritis No date: Asthma 2003: Cancer ([place])     Comment:  UTERINE, total Hysterectomy No date: COPD (chronic obstructive pulmonary disease) (HCC) No date: Depression No date: Hyperlipidemia No date: Hypertension No date: Shortness of breath dyspnea     Comment:  DOE   Reproductive/Obstetrics                             Anesthesia Physical Anesthesia Plan  ASA: III  Anesthesia Plan: General   Post-op Pain Management:    Induction: Intravenous  PONV Risk Score and Plan: 1 and Ondansetron and Dexamethasone  Airway Management Planned: LMA  Additional Equipment:   Intra-op Plan:   Post-operative Plan:   Informed Consent: I have reviewed the patients History and Physical, chart, labs and  discussed the procedure including the risks, benefits and alternatives for the proposed anesthesia with the patient or authorized representative who has indicated his/her understanding and acceptance.     Dental advisory given  Plan Discussed with: CRNA and Anesthesiologist  Anesthesia Plan Comments:         Anesthesia Quick Evaluation

## 2019-08-31 NOTE — Anesthesia Procedure Notes (Signed)
Procedure Name: LMA Insertion Date/Time: 08/31/2019 9:08 AM Performed by: Lowry Bowl, CRNA Pre-anesthesia Checklist: Patient identified, Emergency Drugs available, Suction available and Patient being monitored Patient Re-evaluated:Patient Re-evaluated prior to induction Oxygen Delivery Method: Circle system utilized Preoxygenation: Pre-oxygenation with 100% oxygen Induction Type: IV induction Ventilation: Mask ventilation without difficulty LMA: LMA inserted LMA Size: 4.0 Number of attempts: 1 Placement Confirmation: positive ETCO2 and breath sounds checked- equal and bilateral Tube secured with: Tape Dental Injury: Teeth and Oropharynx as per pre-operative assessment

## 2019-08-31 NOTE — H&P (Signed)
08/31/19   CC:     Chief Complaint  Patient presents with  . Cysto    HPI: 74 yo smoker who presents today for bladder bx based on cystoscopic findings below.    No changes in past medical history.   CT urogram on 07/09/19 for microscopic hematuria shows no GU pathology.  She does have persistent microscopic blood in her urine today.  She is otherwise asymptomatic.  Past Medical History:  Diagnosis Date  . Arthritis   . Asthma   . Cancer [facility]) 2003   UTERINE, total Hysterectomy  . COPD (chronic obstructive pulmonary disease) ([place])   . Depression   . Hyperlipidemia   . Hypertension   . Shortness of breath dyspnea    DOE   Current Meds  Medication Sig  . albuterol (VENTOLIN HFA) 108 (90 Base) MCG/ACT inhaler Inhale 2 puffs into the lungs every 6 (six) hours as needed for wheezing or shortness of breath. Reported on 11/10/2015 (Patient taking differently: Inhale 2 puffs into the lungs every 6 (six) hours as needed for wheezing or shortness of breath. Reported on 11/10/2015)  . budesonide-formoterol (SYMBICORT) 80-4.5 MCG/ACT inhaler Inhale 2 puffs into the lungs 2 (two) times daily.  . ergocalciferol (DRISDOL) 1.25 MG (50000 UT) capsule Take 1 capsule (50,000 Units total) by mouth once a week. (Patient taking differently: Take 50,000 Units by mouth every Tuesday. )  . metoprolol tartrate (LOPRESSOR) 50 MG tablet TAKE 1/2 OF A TABLET BY MOUTH TWICE A DAY (Patient taking differently: Take 25 mg by mouth 2 (two) times daily. )  . rosuvastatin (CRESTOR) 20 MG tablet TAKE 1 TABLET BY MOUTH DAILY FOR HIGH CHOLESTEROL (Patient taking differently: Take 20 mg by mouth at bedtime. TAKE 1 TABLET BY MOUTH DAILY FOR HIGH CHOLESTEROL)  . sertraline (ZOLOFT) 50 MG tablet Take 1 tablet (50 mg total) by mouth daily. (Patient taking differently: Take 50 mg by mouth every morning. )  . [DISCONTINUED] rosuvastatin (CRESTOR) 20 MG tablet TAKE 1 TABLET BY MOUTH DAILY FOR HIGH CHOLESTEROL  (Patient taking differently: Take 20 mg by mouth every evening. )   Allergies  Allergen Reactions  . Aspirin Other (See Comments)    Patient cannot recall what happened  . Crab [Shellfish Allergy] Hives  . Latex Rash  . Nickel Rash     Blood pressure (!) 176/91, pulse 88, weight 161 lb (73 kg). NED. A&Ox3.   No respiratory distress. CTAB. RRR. Abd soft, NT, ND Normal external genitalia with patent urethral meatus  Cystoscopy Procedure Note  Patient identification was confirmed, informed consent was obtained, and patient was prepped using Betadine solution.  Lidocaine jelly was administered per urethral meatus.    Procedure: - Flexible cystoscope introduced, without any difficulty.   - Thorough search of the bladder revealed:    normal urethral meatus    normal urothelium    no stones    no ulcers     spherical lesion at dome measuring ~2 mm    no urethral polyps    no trabeculation  - Ureteral orifices were normal in position and appearance.  Post-Procedure: - Patient tolerated the procedure well  Assessment/ Plan:  1. Lesion of bladder Very small lesion involving the dome of the bladder, ?inflammatiory versus urachal remnant versus early malignancy  Most strongly recommended cystoscopy with bladder biopsy of this lesion.  Risk of the procedure were discussed in detail including risk of bleeding, infection, damage surrounding structures, bladder discomfort amongst others.  All questions answered.  She is willing to proceed as planned.   2. Microscopic hematuria As above - Urinalysis, Complete   Hollice Espy, MD

## 2019-08-31 NOTE — Discharge Instructions (Addendum)
Transurethral Resection of Bladder Tumor (TURBT) or Bladder Biopsy ° ° °Definition: ° Transurethral Resection of the Bladder Tumor is a surgical procedure used to diagnose and remove tumors within the bladder. TURBT is the most common treatment for early stage bladder cancer. ° °General instructions: °   ° Your recent bladder surgery requires very little post hospital care but some definite precautions. ° °Despite the fact that no skin incisions were used, the area around the bladder incisions are raw and covered with scabs to promote healing and prevent bleeding. Certain precautions are needed to insure that the scabs are not disturbed over the next 2-4 weeks while the healing proceeds. ° °Because the raw surface inside your bladder and the irritating effects of urine you may expect frequency of urination and/or urgency (a stronger desire to urinate) and perhaps even getting up at night more often. This will usually resolve or improve slowly over the healing period. You may see some blood in your urine over the first 6 weeks. Do not be alarmed, even if the urine was clear for a while. Get off your feet and drink lots of fluids until clearing occurs. If you start to pass clots or don't improve call us. ° °Diet: ° °You may return to your normal diet immediately. Because of the raw surface of your bladder, alcohol, spicy foods, foods high in acid and drinks with caffeine may cause irritation or frequency and should be used in moderation. To keep your urine flowing freely and avoid constipation, drink plenty of fluids during the day (8-10 glasses). Tip: Avoid cranberry juice because it is very acidic. ° °Activity: ° °Your physical activity doesn't need to be restricted. However, if you are very active, you may see some blood in the urine. We suggest that you reduce your activity under the circumstances until the bleeding has stopped. ° °Bowels: ° °It is important to keep your bowels regular during the postoperative  period. Straining with bowel movements can cause bleeding. A bowel movement every other day is reasonable. Use a mild laxative if needed, such as milk of magnesia 2-3 tablespoons, or 2 Dulcolax tablets. Call if you continue to have problems. If you had been taking narcotics for pain, before, during or after your surgery, you may be constipated. Take a laxative if necessary. ° ° ° °Medication: ° °You should resume your pre-surgery medications unless told not to. In addition you may be given an antibiotic to prevent or treat infection. Antibiotics are not always necessary. All medication should be taken as prescribed until the bottles are finished unless you are having an unusual reaction to one of the drugs. ° ° °[facility] °[place], [place] [address] °[phone] ° ° ° °AMBULATORY SURGERY  °DISCHARGE INSTRUCTIONS ° ° °1) The drugs that you were given will stay in your system until tomorrow so for the next 24 hours you should not: ° °A) Drive an automobile °B) Make any legal decisions °C) Drink any alcoholic beverage ° ° °2) You may resume regular meals tomorrow.  Today it is better to start with liquids and gradually work up to solid foods. ° °You may eat anything you prefer, but it is better to start with liquids, then soup and crackers, and gradually work up to solid foods. ° ° °3) Please notify your doctor immediately if you have any unusual bleeding, trouble breathing, redness and pain at the surgery site, drainage, fever, or pain not relieved by medication. ° ° ° °4) Additional Instructions: ° ° ° ° ° ° ° °  Please contact your physician with any problems or Same Day Surgery at [phone], Monday through Friday 6 am to 4 pm, or [facility] [place] Main number at [phone]. ° °

## 2019-09-01 LAB — SURGICAL PATHOLOGY

## 2019-09-02 ENCOUNTER — Telehealth: Payer: Self-pay | Admitting: *Deleted

## 2019-09-02 NOTE — Telephone Encounter (Addendum)
Patient informed-voiced understanding.  Appointment scheduled.   ----- Message from Hollice Espy, MD sent at 09/01/2019 10:28 AM EST ----- Please let this patient know that I already have her pathology results back and they are negative.  She has some chronic inflammation but no evidence of malignancy or dysplasia.  This is awesome news.  I like her to follow-up in a year with Larene Beach or Sam to recheck her urine/urine cytology.

## 2019-09-22 ENCOUNTER — Telehealth: Payer: Self-pay

## 2019-09-22 NOTE — Telephone Encounter (Signed)
Confirmed virtual visit with patient. klh 

## 2019-09-24 ENCOUNTER — Encounter: Payer: Self-pay | Admitting: Internal Medicine

## 2019-09-24 ENCOUNTER — Other Ambulatory Visit: Payer: Self-pay

## 2019-09-24 ENCOUNTER — Ambulatory Visit (INDEPENDENT_AMBULATORY_CARE_PROVIDER_SITE_OTHER): Payer: PPO | Admitting: Internal Medicine

## 2019-09-24 VITALS — Ht 64.0 in | Wt 160.0 lb

## 2019-09-24 DIAGNOSIS — J4520 Mild intermittent asthma, uncomplicated: Secondary | ICD-10-CM

## 2019-09-24 DIAGNOSIS — I10 Essential (primary) hypertension: Secondary | ICD-10-CM | POA: Diagnosis not present

## 2019-09-24 DIAGNOSIS — J438 Other emphysema: Secondary | ICD-10-CM

## 2019-09-24 DIAGNOSIS — F17210 Nicotine dependence, cigarettes, uncomplicated: Secondary | ICD-10-CM

## 2019-09-24 NOTE — Progress Notes (Signed)
[facility] [place], [place] [address]  Internal MEDICINE  Telephone Visit  Patient Name: Wendy Garza  [id number]  [id number]  Date of Service: 09/24/2019  I connected with the patient at 1212 by telephone and verified the patients identity using two identifiers.   I discussed the limitations, risks, security and privacy concerns of performing an evaluation and management service by telephone and the availability of in person appointments. I also discussed with the patient that there may be a patient responsible charge related to the service.  The patient expressed understanding and agrees to proceed.    Chief Complaint  Patient presents with  . Telephone Assessment  . Telephone Screen  . COPD    HPI  Pt is seen via telephone.  Pt is seen for follow up on copd.  She reports she is using Symbicort at this time for her copd with good results.  She denies hospitalizations or difficultly recently. She continues to social distance and stay in as much as possible.  She has managed not to get covid as of now.  She plans to get vaccine when she can.     Current Medication: Outpatient Encounter Medications as of 09/24/2019  Medication Sig  . albuterol (VENTOLIN HFA) 108 (90 Base) MCG/ACT inhaler Inhale 2 puffs into the lungs every 6 (six) hours as needed for wheezing or shortness of breath. Reported on 11/10/2015 (Patient taking differently: Inhale 2 puffs into the lungs every 6 (six) hours as needed for wheezing or shortness of breath. Reported on 11/10/2015)  . budesonide-formoterol (SYMBICORT) 80-4.5 MCG/ACT inhaler Inhale 2 puffs into the lungs 2 (two) times daily.  . ergocalciferol (DRISDOL) 1.25 MG (50000 UT) capsule Take 1 capsule (50,000 Units total) by mouth once a week. (Patient taking differently: Take 50,000 Units by mouth every Tuesday. )  . ibuprofen (ADVIL) 200 MG tablet Take 600 mg by mouth every 8 (eight) hours as needed (for pain.).  Marland Kitchen metoprolol  tartrate (LOPRESSOR) 50 MG tablet TAKE 1/2 OF A TABLET BY MOUTH TWICE A DAY (Patient taking differently: Take 25 mg by mouth 2 (two) times daily. )  . oxybutynin (DITROPAN) 5 MG tablet Take 1 tablet (5 mg total) by mouth every 8 (eight) hours as needed for bladder spasms.  . rosuvastatin (CRESTOR) 20 MG tablet TAKE 1 TABLET BY MOUTH DAILY FOR HIGH CHOLESTEROL (Patient taking differently: Take 20 mg by mouth at bedtime. TAKE 1 TABLET BY MOUTH DAILY FOR HIGH CHOLESTEROL)  . sertraline (ZOLOFT) 50 MG tablet Take 1 tablet (50 mg total) by mouth daily. (Patient taking differently: Take 50 mg by mouth every morning. )   No facility-administered encounter medications on file as of 09/24/2019.    Surgical History: Past Surgical History:  Procedure Laterality Date  . ABDOMINAL HYSTERECTOMY    . BREAST SURGERY     BIOPSY  . CATARACT EXTRACTION W/PHACO Left 10/04/2015   Procedure: CATARACT EXTRACTION PHACO AND INTRAOCULAR LENS PLACEMENT (IOC);  Surgeon: Birder Robson, MD;  Location: [facility] ORS;  Service: Ophthalmology;  Laterality: Left;  [place]: 00:46.8 AP%: 21.7 CDE: 10.19 Lot # [id number]  . CATARACT EXTRACTION W/PHACO Right 10/25/2015   Procedure: CATARACT EXTRACTION PHACO AND INTRAOCULAR LENS PLACEMENT ([place]);  Surgeon: Birder Robson, MD;  Location: [facility] ORS;  Service: Ophthalmology;  Laterality: Right;  [place] 00:48 AP% 25.2 CDE 12.16 fluid pack lot # [id number]  . COLONOSCOPY WITH PROPOFOL N/A 05/28/2016   Procedure: COLONOSCOPY WITH PROPOFOL;  Surgeon: Manya Silvas, MD;  Location:  [facility];  Service: Endoscopy;  Laterality: N/A;  . CYSTOSCOPY WITH BIOPSY N/A 08/31/2019   Procedure: CYSTOSCOPY WITH bladder BIOPSY;  Surgeon: Hollice Espy, MD;  Location: [facility] ORS;  Service: Urology;  Laterality: N/A;  . CYSTOSCOPY WITH FULGERATION N/A 08/31/2019   Procedure: CYSTOSCOPY WITH FULGERATION;  Surgeon: Hollice Espy, MD;  Location: [facility] ORS;  Service: Urology;  Laterality: N/A;    Medical  History: Past Medical History:  Diagnosis Date  . Arthritis   . Asthma   . Cancer [facility]) 2003   UTERINE, total Hysterectomy  . COPD (chronic obstructive pulmonary disease) ([place])   . Depression   . Hyperlipidemia   . Hypertension   . Shortness of breath dyspnea    DOE    Family History: Family History  Problem Relation Age of Onset  . Kidney disease Mother   . Kidney cancer Neg Hx   . Prostate cancer Neg Hx     Social History   Socioeconomic History  . Marital status: Single    Spouse name: Not on file  . Number of children: Not on file  . Years of education: Not on file  . Highest education level: Not on file  Occupational History  . Not on file  Tobacco Use  . Smoking status: Current Every Day Smoker    Years: 25.00    Types: Cigarettes  . Smokeless tobacco: Never Used  . Tobacco comment: 3 or 4  Substance and Sexual Activity  . Alcohol use: No    Alcohol/week: 0.0 standard drinks  . Drug use: No  . Sexual activity: Not on file  Other Topics Concern  . Not on file  Social History Narrative  . Not on file   Social Determinants of Health   Financial Resource Strain:   . Difficulty of Paying Living Expenses: Not on file  Food Insecurity:   . Worried About Charity fundraiser in the Last Year: Not on file  . Ran Out of Food in the Last Year: Not on file  Transportation Needs:   . Lack of Transportation (Medical): Not on file  . Lack of Transportation (Non-Medical): Not on file  Physical Activity:   . Days of Exercise per Week: Not on file  . Minutes of Exercise per Session: Not on file  Stress:   . Feeling of Stress : Not on file  Social Connections:   . Frequency of Communication with Friends and Family: Not on file  . Frequency of Social Gatherings with Friends and Family: Not on file  . Attends Religious Services: Not on file  . Active Member of Clubs or Organizations: Not on file  . Attends Archivist Meetings: Not on file  . Marital  Status: Not on file  Intimate Partner Violence:   . Fear of Current or Ex-Partner: Not on file  . Emotionally Abused: Not on file  . Physically Abused: Not on file  . Sexually Abused: Not on file      Review of Systems  Constitutional: Negative for chills, fatigue and unexpected weight change.  HENT: Negative for congestion, rhinorrhea, sneezing and sore throat.   Eyes: Negative for photophobia, pain and redness.  Respiratory: Negative for cough, chest tightness and shortness of breath.   Cardiovascular: Negative for chest pain and palpitations.  Gastrointestinal: Negative for abdominal pain, constipation, diarrhea, nausea and vomiting.  Endocrine: Negative.   Genitourinary: Negative for dysuria and frequency.  Musculoskeletal: Negative for arthralgias, back pain, joint swelling and neck pain.  Skin: Negative for rash.  Allergic/Immunologic: Negative.   Neurological: Negative for tremors and numbness.  Hematological: Negative for adenopathy. Does not bruise/bleed easily.  Psychiatric/Behavioral: Negative for behavioral problems and sleep disturbance. The patient is not nervous/anxious.     Vital Signs: Ht 5\' 4"  (1.626 m)   Wt 160 lb (72.6 kg)   LMP  (LMP Unknown)   BMI 27.46 kg/m    Observation/Objective:  Well sounding, NAD noted.    Assessment/Plan: 1. Other emphysema ([place]) Continue to use Symbicort as prescribed.   2. Mild intermittent asthma without complication Continue present management.   3. Essential hypertension Stable, continue present management.  4. Nicotine dependence, cigarettes, uncomplicated Smoking cessation counseling: 1. Pt acknowledges the risks of long term smoking, she will try to quite smoking. 2. Options for different medications including nicotine products, chewing gum, patch etc, Wellbutrin and Chantix is discussed 3. Goal and date of compete cessation is discussed 4. Total time spent in smoking cessation is 15 min.   General  Counseling: ravin davitt understanding of the findings of today's phone visit and agrees with plan of treatment. I have discussed any further diagnostic evaluation that may be needed or ordered today. We also reviewed her medications today. she has been encouraged to call the office with any questions or concerns that should arise related to todays visit.    No orders of the defined types were placed in this encounter.   No orders of the defined types were placed in this encounter.   Time spent: [place] AGNP-C Internal medicine

## 2019-11-06 ENCOUNTER — Other Ambulatory Visit: Payer: PPO

## 2019-11-13 ENCOUNTER — Telehealth: Payer: Self-pay

## 2019-11-13 NOTE — Telephone Encounter (Signed)
CONFIRMED AND SCREENED FOR 11-17-19 OV.

## 2019-11-16 ENCOUNTER — Other Ambulatory Visit: Payer: Self-pay

## 2019-11-16 DIAGNOSIS — F411 Generalized anxiety disorder: Secondary | ICD-10-CM

## 2019-11-16 MED ORDER — SERTRALINE HCL 50 MG PO TABS: 50.0000 mg | ORAL_TABLET | Freq: Every day | ORAL | 1 refills | Status: DC

## 2019-11-17 ENCOUNTER — Other Ambulatory Visit: Payer: Self-pay

## 2019-11-17 ENCOUNTER — Ambulatory Visit (INDEPENDENT_AMBULATORY_CARE_PROVIDER_SITE_OTHER): Payer: PPO | Admitting: Nurse Practitioner

## 2019-11-17 ENCOUNTER — Encounter: Payer: Self-pay | Admitting: Nurse Practitioner

## 2019-11-17 VITALS — BP 146/82 | HR 84 | Temp 97.4°F | Resp 16 | Ht 64.0 in | Wt 163.8 lb

## 2019-11-17 DIAGNOSIS — R319 Hematuria, unspecified: Secondary | ICD-10-CM | POA: Diagnosis not present

## 2019-11-17 DIAGNOSIS — I700 Atherosclerosis of aorta: Secondary | ICD-10-CM

## 2019-11-17 DIAGNOSIS — E559 Vitamin D deficiency, unspecified: Secondary | ICD-10-CM | POA: Diagnosis not present

## 2019-11-17 DIAGNOSIS — R300 Dysuria: Secondary | ICD-10-CM | POA: Diagnosis not present

## 2019-11-17 DIAGNOSIS — I708 Atherosclerosis of other arteries: Secondary | ICD-10-CM

## 2019-11-17 DIAGNOSIS — I10 Essential (primary) hypertension: Secondary | ICD-10-CM

## 2019-11-17 LAB — POCT URINALYSIS DIPSTICK
Bilirubin, UA: NEGATIVE
Glucose, UA: NEGATIVE
Ketones, UA: NEGATIVE
Leukocytes, UA: NEGATIVE
Nitrite, UA: NEGATIVE
Protein, UA: NEGATIVE
Spec Grav, UA: 1.02 (ref 1.010–1.025)
Urobilinogen, UA: 0.2 E.U./dL
pH, UA: 5 (ref 5.0–8.0)

## 2019-11-17 MED ORDER — NITROFURANTOIN MONOHYD MACRO 100 MG PO CAPS: 100.0000 mg | ORAL_CAPSULE | Freq: Two times a day (BID) | ORAL | 0 refills | Status: DC

## 2019-11-17 MED ORDER — ERGOCALCIFEROL 1.25 MG (50000 UT) PO CAPS: 50000.0000 [IU] | ORAL_CAPSULE | ORAL | 5 refills | Status: DC

## 2019-11-17 NOTE — Progress Notes (Signed)
[facility], [place] [address]  Internal MEDICINE  Office Visit Note  Patient Name: Wendy Garza  [id number]  [id number]  Date of Service: 11/18/2019  Chief Complaint  Patient presents with  . Hypertension  . Hyperlipidemia  . Quality Metric Gaps    pt did not have mammo done last year, pna vacc  . Medication Refill    vitamin d, pt states she does not take it all the time, last labs done on 04/2019    The patient is here for routine follow up visit. Had cystoscopy in January due to persistent hematuria. They did do bladder biopsy. Results were negative. She was not asked to have follow up after that procedure. She is concerned that blood in urine was still present. Urinalysis today is positive for large amount of blood. She has had increased urinary frequency since she had bladder biopsy. No burning with urination or bladder pressure. CT of abdomen and pelvis, done per urology found no obvious reason for the patient's hematuria. She does have thoracic aorta calcification and aortoiliac atherosclerosis. She states that she did have both Pfizer COVID 19 vaccines. Her second dose was 10/30/2019. They are documented in her immunization history.       Current Medication: Outpatient Encounter Medications as of 11/17/2019  Medication Sig  . albuterol (VENTOLIN HFA) 108 (90 Base) MCG/ACT inhaler Inhale 2 puffs into the lungs every 6 (six) hours as needed for wheezing or shortness of breath. Reported on 11/10/2015 (Patient taking differently: Inhale 2 puffs into the lungs every 6 (six) hours as needed for wheezing or shortness of breath. Reported on 11/10/2015)  . budesonide-formoterol (SYMBICORT) 80-4.5 MCG/ACT inhaler Inhale 2 puffs into the lungs 2 (two) times daily.  . ergocalciferol (DRISDOL) 1.25 MG (50000 UT) capsule Take 1 capsule (50,000 Units total) by mouth once a week.  Marland Kitchen ibuprofen (ADVIL) 200 MG tablet Take 600 mg by mouth every 8 (eight) hours as  needed (for pain.).  Marland Kitchen metoprolol tartrate (LOPRESSOR) 50 MG tablet TAKE 1/2 OF A TABLET BY MOUTH TWICE A DAY (Patient taking differently: Take 25 mg by mouth 2 (two) times daily. )  . rosuvastatin (CRESTOR) 20 MG tablet TAKE 1 TABLET BY MOUTH DAILY FOR HIGH CHOLESTEROL (Patient taking differently: Take 20 mg by mouth at bedtime. TAKE 1 TABLET BY MOUTH DAILY FOR HIGH CHOLESTEROL)  . sertraline (ZOLOFT) 50 MG tablet Take 1 tablet (50 mg total) by mouth daily.  . [DISCONTINUED] ergocalciferol (DRISDOL) 1.25 MG (50000 UT) capsule Take 1 capsule (50,000 Units total) by mouth once a week. (Patient taking differently: Take 50,000 Units by mouth every Tuesday. )  . nitrofurantoin, macrocrystal-monohydrate, (MACROBID) 100 MG capsule Take 1 capsule (100 mg total) by mouth 2 (two) times daily.  Marland Kitchen oxybutynin (DITROPAN) 5 MG tablet Take 1 tablet (5 mg total) by mouth every 8 (eight) hours as needed for bladder spasms. (Patient not taking: Reported on 11/17/2019)  . [DISCONTINUED] ciprofloxacin (CIPRO) 500 MG tablet Take 1 tablet (500 mg total) by mouth 2 (two) times daily.  . [DISCONTINUED] fluticasone (FLONASE) 50 MCG/ACT nasal spray Place into both nostrils daily. Reported on 11/10/2015  . [DISCONTINUED] meloxicam (MOBIC) 15 MG tablet Take 1 tablet (15 mg total) by mouth daily.  . [DISCONTINUED] Polyethylene Glycol 3350 (PEG 3350) POWD Take as directed for colonoscopy prep.  . [DISCONTINUED] rosuvastatin (CRESTOR) 20 MG tablet TAKE 1 TABLET BY MOUTH DAILY FOR HIGH CHOLESTEROL (Patient taking differently: Take 20 mg by mouth every  evening. )  . [DISCONTINUED] sertraline (ZOLOFT) 50 MG tablet Take 1 tablet (50 mg total) by mouth daily. (Patient taking differently: Take 50 mg by mouth every morning. )   No facility-administered encounter medications on file as of 11/17/2019.    Surgical History: Past Surgical History:  Procedure Laterality Date  . ABDOMINAL HYSTERECTOMY    . BREAST SURGERY     BIOPSY  .  CATARACT EXTRACTION W/PHACO Left 10/04/2015   Procedure: CATARACT EXTRACTION PHACO AND INTRAOCULAR LENS PLACEMENT (IOC);  Surgeon: Birder Robson, MD;  Location: [facility] ORS;  Service: Ophthalmology;  Laterality: Left;  [place]: 00:46.8 AP%: 21.7 CDE: 10.19 Lot # [id number]  . CATARACT EXTRACTION W/PHACO Right 10/25/2015   Procedure: CATARACT EXTRACTION PHACO AND INTRAOCULAR LENS PLACEMENT ([place]);  Surgeon: Birder Robson, MD;  Location: [facility] ORS;  Service: Ophthalmology;  Laterality: Right;  [place] 00:48 AP% 25.2 CDE 12.16 fluid pack lot # [id number]  . COLONOSCOPY WITH PROPOFOL N/A 05/28/2016   Procedure: COLONOSCOPY WITH PROPOFOL;  Surgeon: Manya Silvas, MD;  Location: [facility] ENDOSCOPY;  Service: Endoscopy;  Laterality: N/A;  . CYSTOSCOPY WITH BIOPSY N/A 08/31/2019   Procedure: CYSTOSCOPY WITH bladder BIOPSY;  Surgeon: Hollice Espy, MD;  Location: [facility] ORS;  Service: Urology;  Laterality: N/A;  . CYSTOSCOPY WITH FULGERATION N/A 08/31/2019   Procedure: CYSTOSCOPY WITH FULGERATION;  Surgeon: Hollice Espy, MD;  Location: [facility] ORS;  Service: Urology;  Laterality: N/A;    Medical History: Past Medical History:  Diagnosis Date  . Arthritis   . Asthma   . Cancer [facility]) 2003   UTERINE, total Hysterectomy  . COPD (chronic obstructive pulmonary disease) ([place])   . Depression   . Hyperlipidemia   . Hypertension   . Shortness of breath dyspnea    DOE    Family History: Family History  Problem Relation Age of Onset  . Kidney disease Mother   . Kidney cancer Neg Hx   . Prostate cancer Neg Hx     Social History   Socioeconomic History  . Marital status: Single    Spouse name: Not on file  . Number of children: Not on file  . Years of education: Not on file  . Highest education level: Not on file  Occupational History  . Not on file  Tobacco Use  . Smoking status: Current Every Day Smoker    Years: 25.00    Types: Cigarettes  . Smokeless tobacco: Never Used  . Tobacco comment: 3 or 4  cigarettes a day-reported on 11/17/19  Substance and Sexual Activity  . Alcohol use: No    Alcohol/week: 0.0 standard drinks  . Drug use: No  . Sexual activity: Not on file  Other Topics Concern  . Not on file  Social History Narrative  . Not on file   Social Determinants of Health   Financial Resource Strain:   . Difficulty of Paying Living Expenses:   Food Insecurity:   . Worried About Charity fundraiser in the Last Year:   . Arboriculturist in the Last Year:   Transportation Needs:   . Film/video editor (Medical):   Marland Kitchen Lack of Transportation (Non-Medical):   Physical Activity:   . Days of Exercise per Week:   . Minutes of Exercise per Session:   Stress:   . Feeling of Stress :   Social Connections:   . Frequency of Communication with Friends and Family:   . Frequency of Social Gatherings with Friends and Family:   .  Attends Religious Services:   . Active Member of Clubs or Organizations:   . Attends Archivist Meetings:   Marland Kitchen Marital Status:   Intimate Partner Violence:   . Fear of Current or Ex-Partner:   . Emotionally Abused:   Marland Kitchen Physically Abused:   . Sexually Abused:       Review of Systems  Constitutional: Negative for activity change, chills, fatigue and unexpected weight change.  HENT: Negative for congestion, postnasal drip, rhinorrhea, sneezing and sore throat.   Respiratory: Negative for cough, choking, chest tightness, shortness of breath and wheezing.   Cardiovascular: Negative for chest pain and palpitations.  Gastrointestinal: Negative for abdominal pain, constipation, diarrhea, nausea and vomiting.  Endocrine: Negative for cold intolerance, heat intolerance, polydipsia and polyuria.  Genitourinary: Positive for frequency and hematuria. Negative for dysuria.  Musculoskeletal: Negative for arthralgias, back pain, joint swelling and neck pain.  Skin: Negative for rash.  Allergic/Immunologic: Positive for environmental allergies.   Neurological: Negative for tremors, numbness and headaches.  Hematological: Negative for adenopathy. Does not bruise/bleed easily.  Psychiatric/Behavioral: Negative for behavioral problems (Depression), sleep disturbance and suicidal ideas. The patient is not nervous/anxious.     Today's Vitals   11/17/19 1342  BP: (!) 146/82  Pulse: 84  Resp: 16  Temp: (!) 97.4 F (36.3 C)  SpO2: 93%  Weight: 163 lb 12.8 oz (74.3 kg)  Height: 5\' 4"  (1.626 m)   Body mass index is 28.12 kg/m.  Physical Exam Vitals and nursing note reviewed.  Constitutional:      General: She is not in acute distress.    Appearance: Normal appearance. She is well-developed. She is not diaphoretic.  HENT:     Head: Normocephalic and atraumatic.     Nose: Nose normal.     Mouth/Throat:     Pharynx: No oropharyngeal exudate.  Eyes:     Pupils: Pupils are equal, round, and reactive to light.  Neck:     Thyroid: No thyromegaly.     Vascular: No JVD.     Trachea: No tracheal deviation.  Cardiovascular:     Rate and Rhythm: Normal rate and regular rhythm.     Heart sounds: Normal heart sounds. No murmur. No friction rub. No gallop.   Pulmonary:     Effort: Pulmonary effort is normal. No respiratory distress.     Breath sounds: Normal breath sounds. No wheezing or rales.  Chest:     Chest wall: No tenderness.  Abdominal:     General: Bowel sounds are normal.     Palpations: Abdomen is soft.     Tenderness: There is no abdominal tenderness.  Genitourinary:    Comments: Urine sample positive for large blood.  Musculoskeletal:        General: Normal range of motion.     Cervical back: Normal range of motion and neck supple.  Lymphadenopathy:     Cervical: No cervical adenopathy.  Skin:    General: Skin is warm and dry.  Neurological:     Mental Status: She is alert and oriented to person, place, and time.     Cranial Nerves: No cranial nerve deficit.  Psychiatric:        Behavior: Behavior normal.         Thought Content: Thought content normal.        Judgment: Judgment normal.    Assessment/Plan: 1. Hematuria, unspecified type Large blood in urine. Will treat with macrobid 100mg  bid for 7 days. Send urine for culture  and sensitivity and adjust antibiotics as indicated Recent cystoscopy and bladder biopsy negative. Results were reviewed with the patient. Will have her follow up with urology as indicated  - nitrofurantoin, macrocrystal-monohydrate, (MACROBID) 100 MG capsule; Take 1 capsule (100 mg total) by mouth 2 (two) times daily.  Dispense: 14 capsule; Refill: 0  2. Dysuria - POCT Urinalysis Dipstick - CULTURE, URINE COMPREHENSIVE  3. Atherosclerosis of aortic bifurcation and common iliac arteries (HCC) Renal CT done by urology did show athrerosclerosis of thoracic aorta and aortoiliac arteries. Will get vascular ultrasound of abdominal arteries for further evaluation.  - VAS US AORTA/IVC/ILIACS; Future  4. Essential hypertension Stable. Continue bp medication as prescribed   5. Vitamin D deficiency - ergocalciferol (DRISDOL) 1.25 MG (50000 UT) capsule; Take 1 capsule (50,000 Units total) by mouth once a week.  Dispense: 4 capsule; Refill: 5  General Counseling: jacolyn deheer understanding of the findings of todays visit and agrees with plan of treatment. I have discussed any further diagnostic evaluation that may be needed or ordered today. We also reviewed her medications today. she has been encouraged to call the office with any questions or concerns that should arise related to todays visit.  This patient was seen by Leretha Pol FNP Collaboration with Dr Lavera Guise as a part of collaborative care agreement  Orders Placed This Encounter  Procedures  . CULTURE, URINE COMPREHENSIVE  . POCT Urinalysis Dipstick  . VAS US AORTA/IVC/ILIACS    Meds ordered this encounter  Medications  . nitrofurantoin, macrocrystal-monohydrate, (MACROBID) 100 MG capsule    Sig: Take 1  capsule (100 mg total) by mouth 2 (two) times daily.    Dispense:  14 capsule    Refill:  0    Order Specific Question:   Supervising Provider    Answer:   Lavera Guise [id number]  . ergocalciferol (DRISDOL) 1.25 MG (50000 UT) capsule    Sig: Take 1 capsule (50,000 Units total) by mouth once a week.    Dispense:  4 capsule    Refill:  5    Order Specific Question:   Supervising Provider    Answer:   Lavera Guise [id number]    Total time spent: 30 Minutes   Time spent includes review of chart, medications, test results, and follow up plan with the patient.      Dr Lavera Guise Internal medicine

## 2019-11-18 DIAGNOSIS — I700 Atherosclerosis of aorta: Secondary | ICD-10-CM | POA: Insufficient documentation

## 2019-11-18 DIAGNOSIS — R319 Hematuria, unspecified: Secondary | ICD-10-CM | POA: Insufficient documentation

## 2019-11-22 LAB — CULTURE, URINE COMPREHENSIVE

## 2019-11-24 NOTE — Progress Notes (Signed)
Only small amount normal urogenital flora. Will monitor.

## 2019-12-02 ENCOUNTER — Telehealth: Payer: Self-pay

## 2019-12-02 NOTE — Telephone Encounter (Signed)
Confirmed appointment on 12/04/2019 and screened for [email or web address] °

## 2019-12-04 ENCOUNTER — Ambulatory Visit: Payer: PPO

## 2019-12-04 ENCOUNTER — Other Ambulatory Visit: Payer: Self-pay

## 2019-12-04 DIAGNOSIS — I700 Atherosclerosis of aorta: Secondary | ICD-10-CM | POA: Diagnosis not present

## 2019-12-08 NOTE — Progress Notes (Signed)
Looks good. Discuss results with patient 12/15/2019

## 2019-12-11 ENCOUNTER — Telehealth: Payer: Self-pay

## 2019-12-11 NOTE — Telephone Encounter (Signed)
Lmom to confirm and screen for 12-15-19 ov. 

## 2019-12-15 ENCOUNTER — Other Ambulatory Visit: Payer: Self-pay

## 2019-12-15 ENCOUNTER — Encounter: Payer: Self-pay | Admitting: Nurse Practitioner

## 2019-12-15 ENCOUNTER — Ambulatory Visit (INDEPENDENT_AMBULATORY_CARE_PROVIDER_SITE_OTHER): Payer: PPO | Admitting: Nurse Practitioner

## 2019-12-15 VITALS — BP 144/73 | HR 83 | Temp 97.7°F | Resp 16 | Ht 64.0 in | Wt 163.0 lb

## 2019-12-15 DIAGNOSIS — I700 Atherosclerosis of aorta: Secondary | ICD-10-CM | POA: Diagnosis not present

## 2019-12-15 DIAGNOSIS — I10 Essential (primary) hypertension: Secondary | ICD-10-CM | POA: Diagnosis not present

## 2019-12-15 DIAGNOSIS — I708 Atherosclerosis of other arteries: Secondary | ICD-10-CM | POA: Diagnosis not present

## 2019-12-15 DIAGNOSIS — R319 Hematuria, unspecified: Secondary | ICD-10-CM

## 2019-12-15 DIAGNOSIS — R300 Dysuria: Secondary | ICD-10-CM | POA: Diagnosis not present

## 2019-12-15 LAB — POCT URINALYSIS DIPSTICK
Bilirubin, UA: NEGATIVE
Glucose, UA: NEGATIVE
Ketones, UA: NEGATIVE
Leukocytes, UA: NEGATIVE
Nitrite, UA: NEGATIVE
Protein, UA: NEGATIVE
Spec Grav, UA: 1.01 (ref 1.010–1.025)
Urobilinogen, UA: 0.2 E.U./dL
pH, UA: 5 (ref 5.0–8.0)

## 2019-12-15 NOTE — Progress Notes (Signed)
[facility] [place],  [address]  Internal MEDICINE  Office Visit Note  Patient Name: Wendy Garza  [id number]  [id number]  Date of Service: 12/26/2019  Chief Complaint  Patient presents with  . Follow-up    U/s and UTI  . Hyperlipidemia  . Hypertension    The patient is here for follow up of ultrasound. Renal CT done by urology did show athrerosclerosis of thoracic aorta and aortoiliac arteries. An aorta duplex ultrasound has been done since her last vsiit which was negative for evidence of aortic aneurysm or other abnormalities. She continues to have blood in the urine after treatment with antibiotics.  She completed seven days of macrobid. urien culture grew back very small amount of normal flora. She denies dysuria, frequency, or flank pain.       Current Medication: Outpatient Encounter Medications as of 12/15/2019  Medication Sig  . albuterol (VENTOLIN HFA) 108 (90 Base) MCG/ACT inhaler Inhale 2 puffs into the lungs every 6 (six) hours as needed for wheezing or shortness of breath. Reported on 11/10/2015 (Patient taking differently: Inhale 2 puffs into the lungs every 6 (six) hours as needed for wheezing or shortness of breath. Reported on 11/10/2015)  . budesonide-formoterol (SYMBICORT) 80-4.5 MCG/ACT inhaler Inhale 2 puffs into the lungs 2 (two) times daily.  . ergocalciferol (DRISDOL) 1.25 MG (50000 UT) capsule Take 1 capsule (50,000 Units total) by mouth once a week.  Marland Kitchen ibuprofen (ADVIL) 200 MG tablet Take 600 mg by mouth every 8 (eight) hours as needed (for pain.).  Marland Kitchen metoprolol tartrate (LOPRESSOR) 50 MG tablet TAKE 1/2 OF A TABLET BY MOUTH TWICE A DAY (Patient taking differently: Take 25 mg by mouth 2 (two) times daily. )  . nitrofurantoin, macrocrystal-monohydrate, (MACROBID) 100 MG capsule Take 1 capsule (100 mg total) by mouth 2 (two) times daily.  Marland Kitchen oxybutynin (DITROPAN) 5 MG tablet Take 1 tablet (5 mg total) by mouth every 8 (eight)  hours as needed for bladder spasms.  . rosuvastatin (CRESTOR) 20 MG tablet TAKE 1 TABLET BY MOUTH DAILY FOR HIGH CHOLESTEROL (Patient taking differently: Take 20 mg by mouth at bedtime. TAKE 1 TABLET BY MOUTH DAILY FOR HIGH CHOLESTEROL)  . sertraline (ZOLOFT) 50 MG tablet Take 1 tablet (50 mg total) by mouth daily.   No facility-administered encounter medications on file as of 12/15/2019.    Surgical History: Past Surgical History:  Procedure Laterality Date  . ABDOMINAL HYSTERECTOMY    . BREAST SURGERY     BIOPSY  . CATARACT EXTRACTION W/PHACO Left 10/04/2015   Procedure: CATARACT EXTRACTION PHACO AND INTRAOCULAR LENS PLACEMENT (IOC);  Surgeon: Birder Robson, MD;  Location: [facility] ORS;  Service: Ophthalmology;  Laterality: Left;  [place]: 00:46.8 AP%: 21.7 CDE: 10.19 Lot # [id number]  . CATARACT EXTRACTION W/PHACO Right 10/25/2015   Procedure: CATARACT EXTRACTION PHACO AND INTRAOCULAR LENS PLACEMENT ([place]);  Surgeon: Birder Robson, MD;  Location: [facility] ORS;  Service: Ophthalmology;  Laterality: Right;  [place] 00:48 AP% 25.2 CDE 12.16 fluid pack lot # [id number]  . COLONOSCOPY WITH PROPOFOL N/A 05/28/2016   Procedure: COLONOSCOPY WITH PROPOFOL;  Surgeon: Manya Silvas, MD;  Location: [facility] ENDOSCOPY;  Service: Endoscopy;  Laterality: N/A;  . CYSTOSCOPY WITH BIOPSY N/A 08/31/2019   Procedure: CYSTOSCOPY WITH bladder BIOPSY;  Surgeon: Hollice Espy, MD;  Location: [facility] ORS;  Service: Urology;  Laterality: N/A;  . CYSTOSCOPY WITH FULGERATION N/A 08/31/2019   Procedure: CYSTOSCOPY WITH FULGERATION;  Surgeon: Hollice Espy, MD;  Location: [facility] ORS;  Service: Urology;  Laterality: N/A;    Medical History: Past Medical History:  Diagnosis Date  . Arthritis   . Asthma   . Cancer [facility]) 2003   UTERINE, total Hysterectomy  . COPD (chronic obstructive pulmonary disease) ([place])   . Depression   . Hyperlipidemia   . Hypertension   . Shortness of breath dyspnea    DOE    Family History: Family  History  Problem Relation Age of Onset  . Kidney disease Mother   . Kidney cancer Neg Hx   . Prostate cancer Neg Hx     Social History   Socioeconomic History  . Marital status: Single    Spouse name: Not on file  . Number of children: Not on file  . Years of education: Not on file  . Highest education level: Not on file  Occupational History  . Not on file  Tobacco Use  . Smoking status: Current Every Day Smoker    Years: 25.00    Types: Cigarettes  . Smokeless tobacco: Never Used  . Tobacco comment: 3 or 4 cigarettes a day-reported on 11/17/19  Substance and Sexual Activity  . Alcohol use: No    Alcohol/week: 0.0 standard drinks  . Drug use: No  . Sexual activity: Not on file  Other Topics Concern  . Not on file  Social History Narrative  . Not on file   Social Determinants of Health   Financial Resource Strain:   . Difficulty of Paying Living Expenses:   Food Insecurity:   . Worried About Charity fundraiser in the Last Year:   . Arboriculturist in the Last Year:   Transportation Needs:   . Film/video editor (Medical):   Marland Kitchen Lack of Transportation (Non-Medical):   Physical Activity:   . Days of Exercise per Week:   . Minutes of Exercise per Session:   Stress:   . Feeling of Stress :   Social Connections:   . Frequency of Communication with Friends and Family:   . Frequency of Social Gatherings with Friends and Family:   . Attends Religious Services:   . Active Member of Clubs or Organizations:   . Attends Archivist Meetings:   Marland Kitchen Marital Status:   Intimate Partner Violence:   . Fear of Current or Ex-Partner:   . Emotionally Abused:   Marland Kitchen Physically Abused:   . Sexually Abused:       Review of Systems  Constitutional: Negative for activity change, chills, fatigue and unexpected weight change.  HENT: Negative for congestion, postnasal drip, rhinorrhea, sneezing and sore throat.   Respiratory: Negative for cough, choking, chest tightness,  shortness of breath and wheezing.   Cardiovascular: Negative for chest pain and palpitations.  Gastrointestinal: Negative for abdominal pain, constipation, diarrhea, nausea and vomiting.  Endocrine: Negative for cold intolerance, heat intolerance, polydipsia and polyuria.  Genitourinary: Positive for hematuria. Negative for dysuria and frequency.  Musculoskeletal: Negative for arthralgias, back pain, joint swelling and neck pain.  Skin: Negative for rash.  Allergic/Immunologic: Positive for environmental allergies.  Neurological: Negative for tremors, numbness and headaches.  Hematological: Negative for adenopathy. Does not bruise/bleed easily.  Psychiatric/Behavioral: Negative for behavioral problems (Depression), sleep disturbance and suicidal ideas. The patient is not nervous/anxious.     Today's Vitals   12/15/19 1142  BP: (!) 144/73  Pulse: 83  Resp: 16  Temp: 97.7 F (36.5 C)  SpO2: 97%  Weight: 163 lb (73.9 kg)  Height: 5\' 4"  (  1.626 m)   Body mass index is 27.98 kg/m.  Physical Exam Vitals and nursing note reviewed.  Constitutional:      General: She is not in acute distress.    Appearance: Normal appearance. She is well-developed. She is not diaphoretic.  HENT:     Head: Normocephalic and atraumatic.     Nose: Nose normal.     Mouth/Throat:     Pharynx: No oropharyngeal exudate.  Eyes:     Pupils: Pupils are equal, round, and reactive to light.  Neck:     Thyroid: No thyromegaly.     Vascular: No JVD.     Trachea: No tracheal deviation.  Cardiovascular:     Rate and Rhythm: Normal rate and regular rhythm.     Heart sounds: Normal heart sounds. No murmur. No friction rub. No gallop.   Pulmonary:     Effort: Pulmonary effort is normal. No respiratory distress.     Breath sounds: Normal breath sounds. No wheezing or rales.  Chest:     Chest wall: No tenderness.  Abdominal:     Palpations: Abdomen is soft.  Genitourinary:    Comments: Urine sample positive  for large blood.  Musculoskeletal:        General: Normal range of motion.     Cervical back: Normal range of motion and neck supple.  Lymphadenopathy:     Cervical: No cervical adenopathy.  Skin:    General: Skin is warm and dry.  Neurological:     Mental Status: She is alert and oriented to person, place, and time.     Cranial Nerves: No cranial nerve deficit.  Psychiatric:        Behavior: Behavior normal.        Thought Content: Thought content normal.        Judgment: Judgment normal.    Assessment/Plan: 1. Hematuria, unspecified type There is still large amount of blood in urine after seven day treatment with macrobid. Will refer to new urologist for further evaluation.  - Ambulatory referral to Urology  2. Atherosclerosis of aortic bifurcation and common iliac arteries ([place]) Reviewed results of aorta duplex ultrasound with patient. There is no evidence of aortic aneurysm or other abnormalities. Will continue to monitor routinely.   3. Essential hypertension Stable. Continue bp medication as prescribed   4. Dysuria Send urine for culture and sensitivity and treat as indicated.  - CULTURE, URINE COMPREHENSIVE - POCT Urinalysis Dipstick  General Counseling: eldean goughnour understanding of the findings of todays visit and agrees with plan of treatment. I have discussed any further diagnostic evaluation that may be needed or ordered today. We also reviewed her medications today. she has been encouraged to call the office with any questions or concerns that should arise related to todays visit.  This patient was seen by Leretha Pol FNP Collaboration with Dr Lavera Guise as a part of collaborative care agreement  Orders Placed This Encounter  Procedures  . CULTURE, URINE COMPREHENSIVE  . Ambulatory referral to Urology  . POCT Urinalysis Dipstick      Total time spent: 30 Minutes   Time spent includes review of chart, medications, test results, and follow up plan  with the patient.      Dr Lavera Guise Internal medicine

## 2019-12-19 LAB — CULTURE, URINE COMPREHENSIVE

## 2019-12-19 NOTE — Progress Notes (Signed)
Patient referred to new urologist due to persistent, large hematuria

## 2020-02-09 ENCOUNTER — Other Ambulatory Visit: Payer: Self-pay

## 2020-02-09 MED ORDER — ROSUVASTATIN CALCIUM 20 MG PO TABS: ORAL_TABLET | ORAL | 1 refills | Status: DC

## 2020-02-25 ENCOUNTER — Other Ambulatory Visit: Payer: Self-pay | Admitting: Adult Health

## 2020-02-25 DIAGNOSIS — J449 Chronic obstructive pulmonary disease, unspecified: Secondary | ICD-10-CM

## 2020-03-24 ENCOUNTER — Other Ambulatory Visit: Payer: Self-pay

## 2020-03-24 MED ORDER — METOPROLOL TARTRATE 50 MG PO TABS: ORAL_TABLET | ORAL | 5 refills | Status: DC

## 2020-05-03 ENCOUNTER — Other Ambulatory Visit: Payer: Self-pay

## 2020-05-12 ENCOUNTER — Other Ambulatory Visit: Payer: Self-pay

## 2020-05-12 DIAGNOSIS — F411 Generalized anxiety disorder: Secondary | ICD-10-CM

## 2020-05-12 MED ORDER — SERTRALINE HCL 50 MG PO TABS: 50.0000 mg | ORAL_TABLET | Freq: Every day | ORAL | 0 refills | Status: DC

## 2020-05-18 ENCOUNTER — Telehealth: Payer: Self-pay

## 2020-05-18 NOTE — Telephone Encounter (Signed)
Confirmed and screened for ov on 9/23

## 2020-05-19 ENCOUNTER — Ambulatory Visit (INDEPENDENT_AMBULATORY_CARE_PROVIDER_SITE_OTHER): Payer: PPO | Admitting: Nurse Practitioner

## 2020-05-19 ENCOUNTER — Encounter: Payer: Self-pay | Admitting: Nurse Practitioner

## 2020-05-19 ENCOUNTER — Other Ambulatory Visit: Payer: Self-pay

## 2020-05-19 VITALS — BP 148/81 | HR 88 | Temp 97.5°F | Resp 16 | Ht 64.0 in | Wt 159.6 lb

## 2020-05-19 DIAGNOSIS — Z0001 Encounter for general adult medical examination with abnormal findings: Secondary | ICD-10-CM

## 2020-05-19 DIAGNOSIS — E782 Mixed hyperlipidemia: Secondary | ICD-10-CM | POA: Diagnosis not present

## 2020-05-19 DIAGNOSIS — I10 Essential (primary) hypertension: Secondary | ICD-10-CM | POA: Diagnosis not present

## 2020-05-19 DIAGNOSIS — B372 Candidiasis of skin and nail: Secondary | ICD-10-CM

## 2020-05-19 DIAGNOSIS — Z1231 Encounter for screening mammogram for malignant neoplasm of breast: Secondary | ICD-10-CM

## 2020-05-19 DIAGNOSIS — R300 Dysuria: Secondary | ICD-10-CM

## 2020-05-19 DIAGNOSIS — Z23 Encounter for immunization: Secondary | ICD-10-CM | POA: Diagnosis not present

## 2020-05-19 DIAGNOSIS — I700 Atherosclerosis of aorta: Secondary | ICD-10-CM | POA: Diagnosis not present

## 2020-05-19 MED ORDER — PNEUMOCOCCAL 13-VAL CONJ VACC IM SUSP: 0.5000 mL | Freq: Once | INTRAMUSCULAR | 0 refills | Status: AC

## 2020-05-19 MED ORDER — ZOSTER VAC RECOMB ADJUVANTED 50 MCG/0.5ML IM SUSR: INTRAMUSCULAR | 1 refills | Status: DC

## 2020-05-19 MED ORDER — CLOTRIMAZOLE-BETAMETHASONE 1-0.05 % EX CREA: 1.0000 "application " | TOPICAL_CREAM | Freq: Two times a day (BID) | CUTANEOUS | 1 refills | Status: DC

## 2020-05-19 NOTE — Progress Notes (Signed)
[facility] [place], [place] [address]  Internal MEDICINE  Office Visit Note  Patient Name: Wendy Garza  [id number]  [id number]  Date of Service: 06/05/2020   Pt is here for routine health maintenance examination   Chief Complaint  Patient presents with  . Medicare Wellness  . Depression  . Hyperlipidemia  . Hypertension  . Quality Metric Gaps    flu, tetnaus     The patient is here for health maintenance exam. She believes she has ringworm. She has circular, red, rash, on the underside of both arms. It is slightly worse on the right side than the left. States that it has been present for months. Gradually getting worse but not really bothering her.  She is due to have routine, fasting labs. She should also be scheduled for screening mammogram. She would like to get her flu shot today. She would also like to have new shingles vaccine series, and prevnar 13.  History of hematuria. Has seen urology most recently. Had bladder biopsy which is negative. She is scheduled for follow up in 08/2020.    Current Medication: Outpatient Encounter Medications as of 05/19/2020  Medication Sig  . albuterol (VENTOLIN HFA) 108 (90 Base) MCG/ACT inhaler Inhale 2 puffs into the lungs every 6 (six) hours as needed for wheezing or shortness of breath. Reported on 11/10/2015 (Patient taking differently: Inhale 2 puffs into the lungs every 6 (six) hours as needed for wheezing or shortness of breath. Reported on 11/10/2015)  . budesonide-formoterol (SYMBICORT) 80-4.5 MCG/ACT inhaler INHALE 2 PUFFS BY MOUTH TWICE A DAY  . ergocalciferol (DRISDOL) 1.25 MG (50000 UT) capsule Take 1 capsule (50,000 Units total) by mouth once a week.  Marland Kitchen ibuprofen (ADVIL) 200 MG tablet Take 600 mg by mouth every 8 (eight) hours as needed (for pain.).  Marland Kitchen metoprolol tartrate (LOPRESSOR) 50 MG tablet TAKE 1/2 OF A TABLET BY MOUTH TWICE A DAY  . nitrofurantoin, macrocrystal-monohydrate, (MACROBID) 100 MG  capsule Take 1 capsule (100 mg total) by mouth 2 (two) times daily.  Marland Kitchen oxybutynin (DITROPAN) 5 MG tablet Take 1 tablet (5 mg total) by mouth every 8 (eight) hours as needed for bladder spasms.  . rosuvastatin (CRESTOR) 20 MG tablet TAKE 1 TABLET BY MOUTH DAILY FOR HIGH CHOLESTEROL  . sertraline (ZOLOFT) 50 MG tablet Take 1 tablet (50 mg total) by mouth daily.  . clotrimazole-betamethasone (LOTRISONE) cream Apply 1 application topically 2 (two) times daily.  . [EXPIRED] pneumococcal 13-valent conjugate vaccine (PREVNAR 13) SUSP injection Inject 0.5 mLs into the muscle once for 1 dose.  Marland Kitchen Zoster Vaccine Adjuvanted [facility]) injection Shingles vaccine series - inject IM as directed .   No facility-administered encounter medications on file as of 05/19/2020.    Surgical History: Past Surgical History:  Procedure Laterality Date  . ABDOMINAL HYSTERECTOMY    . BREAST SURGERY     BIOPSY  . CATARACT EXTRACTION W/PHACO Left 10/04/2015   Procedure: CATARACT EXTRACTION PHACO AND INTRAOCULAR LENS PLACEMENT (IOC);  Surgeon: Birder Robson, MD;  Location: [facility] ORS;  Service: Ophthalmology;  Laterality: Left;  [place]: 00:46.8 AP%: 21.7 CDE: 10.19 Lot # [id number]  . CATARACT EXTRACTION W/PHACO Right 10/25/2015   Procedure: CATARACT EXTRACTION PHACO AND INTRAOCULAR LENS PLACEMENT ([place]);  Surgeon: Birder Robson, MD;  Location: [facility] ORS;  Service: Ophthalmology;  Laterality: Right;  [place] 00:48 AP% 25.2 CDE 12.16 fluid pack lot # [id number]  . COLONOSCOPY WITH PROPOFOL N/A 05/28/2016   Procedure: COLONOSCOPY WITH PROPOFOL;  Surgeon:  Manya Silvas, MD;  Location: [facility] ENDOSCOPY;  Service: Endoscopy;  Laterality: N/A;  . CYSTOSCOPY WITH BIOPSY N/A 08/31/2019   Procedure: CYSTOSCOPY WITH bladder BIOPSY;  Surgeon: Hollice Espy, MD;  Location: [facility] ORS;  Service: Urology;  Laterality: N/A;  . CYSTOSCOPY WITH FULGERATION N/A 08/31/2019   Procedure: CYSTOSCOPY WITH FULGERATION;  Surgeon: Hollice Espy, MD;   Location: [facility] ORS;  Service: Urology;  Laterality: N/A;    Medical History: Past Medical History:  Diagnosis Date  . Arthritis   . Asthma   . Cancer [facility]) 2003   UTERINE, total Hysterectomy  . COPD (chronic obstructive pulmonary disease) ([place])   . Depression   . Hyperlipidemia   . Hypertension   . Shortness of breath dyspnea    DOE    Family History: Family History  Problem Relation Age of Onset  . Kidney disease Mother   . Kidney cancer Neg Hx   . Prostate cancer Neg Hx       Review of Systems  Constitutional: Negative for activity change, chills, fatigue and unexpected weight change.  HENT: Negative for congestion, postnasal drip, rhinorrhea, sneezing and sore throat.   Respiratory: Negative for cough, choking, chest tightness, shortness of breath and wheezing.   Cardiovascular: Negative for chest pain and palpitations.  Gastrointestinal: Negative for abdominal pain, constipation, diarrhea, nausea and vomiting.  Endocrine: Negative for cold intolerance, heat intolerance, polydipsia and polyuria.  Genitourinary: Negative for dysuria, frequency and hematuria.  Musculoskeletal: Negative for arthralgias, back pain, joint swelling and neck pain.  Skin: Positive for rash.       There is circular rash on the underside of the arms. Itchy and irritated.   Allergic/Immunologic: Positive for environmental allergies.  Neurological: Negative for tremors, numbness and headaches.  Hematological: Negative for adenopathy. Does not bruise/bleed easily.  Psychiatric/Behavioral: Negative for behavioral problems (Depression), sleep disturbance and suicidal ideas. The patient is not nervous/anxious.      Today's Vitals   05/19/20 1354  BP: (!) 148/81  Pulse: 88  Resp: 16  Temp: (!) 97.5 F (36.4 C)  SpO2: 96%  Weight: 159 lb 9.6 oz (72.4 kg)  Height: _0  (1.626 m)   Body mass index is 27.4 kg/m.  Physical Exam Vitals and nursing note reviewed.  Constitutional:       General: She is not in acute distress.    Appearance: Normal appearance. She is well-developed. She is not diaphoretic.  HENT:     Head: Normocephalic and atraumatic.     Nose: Nose normal.     Mouth/Throat:     Pharynx: No oropharyngeal exudate.  Eyes:     Pupils: Pupils are equal, round, and reactive to light.  Neck:     Thyroid: No thyromegaly.     Vascular: No carotid bruit or JVD.     Trachea: No tracheal deviation.  Cardiovascular:     Rate and Rhythm: Normal rate and regular rhythm.     Pulses: Normal pulses.     Heart sounds: Normal heart sounds. No murmur heard.  No friction rub. No gallop.   Pulmonary:     Effort: Pulmonary effort is normal. No respiratory distress.     Breath sounds: Normal breath sounds. No wheezing or rales.  Chest:     Chest wall: No tenderness.     Breasts:        Right: Normal. No swelling, bleeding, inverted nipple, mass, nipple discharge, skin change or tenderness.        Left: Normal.  No swelling, bleeding, inverted nipple, mass, nipple discharge, skin change or tenderness.  Abdominal:     General: Bowel sounds are normal.     Palpations: Abdomen is soft.     Tenderness: There is no abdominal tenderness.  Musculoskeletal:        General: Normal range of motion.     Cervical back: Normal range of motion and neck supple.  Lymphadenopathy:     Cervical: No cervical adenopathy.     Upper Body:     Right upper body: No axillary adenopathy.     Left upper body: No axillary adenopathy.  Skin:    General: Skin is warm and dry.     Capillary Refill: Capillary refill takes less than 2 seconds.     Findings: Rash present.     Comments: Circular rashes present on the inner aspect of both arms,. The rash on the right side is larger than the left. Both have darker exterior border with lighter inner rash. Skin is scaly but intact with no drainage present.   Neurological:     General: No focal deficit present.     Mental Status: She is alert and  oriented to person, place, and time.     Cranial Nerves: No cranial nerve deficit.  Psychiatric:        Mood and Affect: Mood normal.        Behavior: Behavior normal.        Thought Content: Thought content normal.        Judgment: Judgment normal.    Depression screen [facility] 2/9 05/19/2020 11/17/2019 09/24/2019 05/18/2019 05/18/2019  Decreased Interest 0 0 0 0 0  Down, Depressed, Hopeless 0 0 0 0 0  PHQ - 2 Score 0 0 0 0 0    Functional Status Survey: Is the patient deaf or have difficulty hearing?: Yes Does the patient have difficulty seeing, even when wearing glasses/contacts?: No Does the patient have difficulty concentrating, remembering, or making decisions?: No Does the patient have difficulty walking or climbing stairs?: Yes Does the patient have difficulty dressing or bathing?: No Does the patient have difficulty doing errands alone such as visiting a doctor's office or shopping?: No  MMSE - Mini Mental State Exam 05/19/2020 05/18/2019 05/13/2018  Orientation to time _0 Orientation to Place _1 Registration _2 Attention/ Calculation _3 Recall _4 Language- name 2 objects _5 Language- repeat _6 Language- follow 3 step command _7 Language- read & follow direction _8 Write a sentence _9 Copy design _10 Total score _11 Fall Risk  05/19/2020 11/17/2019 09/24/2019 05/18/2019 05/18/2019  Falls in the past year? 0 0 0 0 0     LABS: Recent Results (from the past 2160 hour(s))  UA/M w/rflx Culture, Routine     Status: Abnormal   Collection Time: 05/19/20  4:17 PM   Specimen: Urine   Urine  Result Value Ref Range   Specific Gravity, UA 1.024 1.005 - 1.030   pH, UA 5.5 5.0 - 7.5   Color, UA Yellow Yellow   Appearance Ur Clear Clear   Leukocytes,UA Negative Negative   Protein,UA Negative Negative/Trace   Glucose, UA Negative Negative   Ketones, UA Trace (A) Negative   RBC, UA Trace (A) Negative   Bilirubin, UA Negative Negative  Urobilinogen, Ur 0.2 0.2 - 1.0 mg/dL   Nitrite, UA Negative Negative   Microscopic Examination See below:     Comment: Microscopic was indicated and was performed.   Urinalysis Reflex Comment     Comment: This specimen will not reflex to a Urine Culture.  Microscopic Examination     Status: Abnormal   Collection Time: 05/19/20  4:17 PM   Urine  Result Value Ref Range   WBC, UA None seen 0 - 5 /hpf   RBC 11-30 (A) 0 - 2 /hpf   Epithelial Cells (non renal) 0-10 0 - 10 /hpf   Casts None seen None seen /lpf   Bacteria, UA None seen None seen/Few  Comprehensive metabolic panel     Status: Abnormal   Collection Time: 06/01/20  8:54 AM  Result Value Ref Range   Glucose 119 (H) 65 - 99 mg/dL   BUN 12 8 - 27 mg/dL   Creatinine, Ser 0.94 0.57 - 1.00 mg/dL   GFR calc non Af Amer 61 >59 mL/min/1.73   GFR calc Af Amer 71 >59 mL/min/1.73    Comment: **[facility] currently reports eGFR in compliance with the current**   recommendations of the [employer]. [facility] will   update reporting as new guidelines are published from the NKF-ASN   Task force.    BUN/Creatinine Ratio 13 12 - 28   Sodium 141 134 - 144 mmol/L   Potassium 4.5 3.5 - 5.2 mmol/L   Chloride 104 96 - 106 mmol/L   CO2 24 20 - 29 mmol/L   Calcium 9.4 8.7 - 10.3 mg/dL   Total Protein 6.6 6.0 - 8.5 g/dL   Albumin 4.3 3.7 - 4.7 g/dL   Globulin, Total 2.3 1.5 - 4.5 g/dL   Albumin/Globulin Ratio 1.9 1.2 - 2.2   Bilirubin Total 0.5 0.0 - 1.2 mg/dL   Alkaline Phosphatase 68 44 - 121 IU/L    Comment:               **Please note reference interval change**   AST 18 0 - 40 IU/L   ALT 18 0 - 32 IU/L  CBC     Status: None   Collection Time: 06/01/20  8:54 AM  Result Value Ref Range   WBC 8.6 3.4 - 10.8 x10E3/uL   RBC 4.30 3.77 - 5.28 x10E6/uL   Hemoglobin 13.8 11.1 - 15.9 g/dL   Hematocrit 40.8 34.0 - 46.6 %   MCV 95 79 - 97 fL   MCH 32.1 26.6 - 33.0 pg   MCHC 33.8 31 - 35 g/dL   RDW 12.8 11.7 - 15.4 %    Platelets 304 150 - 450 x10E3/uL  Lipid Panel With LDL/HDL Ratio     Status: Abnormal   Collection Time: 06/01/20  8:54 AM  Result Value Ref Range   Cholesterol, Total 122 100 - 199 mg/dL   Triglycerides 190 (H) 0 - 149 mg/dL   HDL 24 (L) >39 mg/dL   VLDL Cholesterol Cal 32 5 - 40 mg/dL   LDL Chol Calc (NIH) 66 0 - 99 mg/dL   LDL/HDL Ratio 2.8 0.0 - 3.2 ratio    Comment:                                     LDL/HDL Ratio  Men  Women                               1/2 Avg.Risk  1.0    1.5                                   Avg.Risk  3.6    3.2                                2X Avg.Risk  6.2    5.0                                3X Avg.Risk  8.0    6.1   T4, free     Status: None   Collection Time: 06/01/20  8:54 AM  Result Value Ref Range   Free T4 0.87 0.82 - 1.77 ng/dL  TSH     Status: Abnormal   Collection Time: 06/01/20  8:54 AM  Result Value Ref Range   TSH 4.760 (H) 0.450 - 4.500 uIU/mL  VITAMIN D 25 Hydroxy (Vit-D Deficiency, Fractures)     Status: None   Collection Time: 06/01/20  8:54 AM  Result Value Ref Range   Vit D, 25-Hydroxy 38.5 30.0 - 100.0 ng/mL    Comment: Vitamin D deficiency has been defined by the [place] practice guideline as a level of serum 25-OH vitamin D less than 20 ng/mL (1,2). The Endocrine Society went on to further define vitamin D insufficiency as a level between 21 and 29 ng/mL (2). 1. IOM (Institute of Medicine). 2010. Dietary reference    intakes for calcium and D. [place]: The    [employer]. 2. Holick MF, Binkley [place], Bischoff-Ferrari HA, et al.    Evaluation, treatment, and prevention of vitamin D    deficiency: an Endocrine Society clinical practice    guideline. JCEM. 2011 Jul; 96(7):1911-30.     Assessment/Plan: 1. Encounter for general adult medical examination with abnormal findings Annual health maintenance exam today.  2.  Essential hypertension Stable. Continue bp medication as prescribed   3. Mixed hyperlipidemia Continue rosuvastatin as prescribed   4. Cutaneous candidiasis Add lotrisone cream. Apply twice daily to effected area twice daily. Advised her to use for two to three days after resolution of rash.  - clotrimazole-betamethasone (LOTRISONE) cream; Apply 1 application topically 2 (two) times daily.  Dispense: 45 g; Refill: 1  5. Atherosclerosis of aorta ([place]) Aortic atherosclerosis noted on CT abdomen and pelvis in 2017. There is calcification of right coronary and descending arteries and thoracic aortic atherosclerosis noted on later CT 05-2019. Patient on rosuvastatin. Will monitor.   6. Need for vaccination against Streptococcus pneumoniae using pneumococcal conjugate vaccine 7 Prescription for prevnar 13 sent to her pharmacy for administration.  - pneumococcal 13-valent conjugate vaccine (PREVNAR 13) SUSP injection; Inject 0.5 mLs into the muscle once for 1 dose.  Dispense: 0.5 mL; Refill: 0  7. Needs flu shot Flu vaccine administered during today's visit. - Flu Vaccine MDCK QUAD PF  8. Need for shingles vaccine Prescription for shingles vaccine series sent to her pharmacy for administration.  - Zoster Vaccine Adjuvanted [facility]) injection; Shingles vaccine series - inject IM as directed .  Dispense: 0.5 mL; Refill: 1  9. Encounter for screening mammogram for malignant neoplasm of breast - MM DIGITAL SCREENING BILATERAL; Future  10. Dysuria - UA/M w/rflx Culture, Routine  General Counseling: ela moffat understanding of the findings of todays visit and agrees with plan of treatment. I have discussed any further diagnostic evaluation that may be needed or ordered today. We also reviewed her medications today. she has been encouraged to call the office with any questions or concerns that should arise related to todays visit.    Counseling:  This patient was seen by Leretha Pol  FNP Collaboration with Dr Lavera Guise as a part of collaborative care agreement  Orders Placed This Encounter  Procedures  . Microscopic Examination  . MM DIGITAL SCREENING BILATERAL  . Flu Vaccine MDCK QUAD PF  . UA/M w/rflx Culture, Routine    Meds ordered this encounter  Medications  . clotrimazole-betamethasone (LOTRISONE) cream    Sig: Apply 1 application topically 2 (two) times daily.    Dispense:  45 g    Refill:  1    Order Specific Question:   Supervising Provider    Answer:   Lavera Guise [[id number]]  . Zoster Vaccine Adjuvanted [facility]) injection    Sig: Shingles vaccine series - inject IM as directed .    Dispense:  0.5 mL    Refill:  1    Order Specific Question:   Supervising Provider    Answer:   Lavera Guise [[id number]]  . pneumococcal 13-valent conjugate vaccine (PREVNAR 13) SUSP injection    Sig: Inject 0.5 mLs into the muscle once for 1 dose.    Dispense:  0.5 mL    Refill:  0    Order Specific Question:   Supervising Provider    Answer:   Lavera Guise [[id number]]    Total time spent: 42 Minutes  Time spent includes review of chart, medications, test results, and follow up plan with the patient.     Lavera Guise, MD  Internal Medicine

## 2020-05-20 LAB — UA/M W/RFLX CULTURE, ROUTINE
Bilirubin, UA: NEGATIVE
Glucose, UA: NEGATIVE
Leukocytes,UA: NEGATIVE
Nitrite, UA: NEGATIVE
Protein,UA: NEGATIVE
Specific Gravity, UA: 1.024 (ref 1.005–1.030)
Urobilinogen, Ur: 0.2 mg/dL (ref 0.2–1.0)
pH, UA: 5.5 (ref 5.0–7.5)

## 2020-05-20 LAB — MICROSCOPIC EXAMINATION
Bacteria, UA: NONE SEEN
Casts: NONE SEEN /lpf
WBC, UA: NONE SEEN /hpf (ref 0–5)

## 2020-05-20 NOTE — Progress Notes (Signed)
Only trace RBC in urine. This is improvement. Seeing urology.

## 2020-06-01 ENCOUNTER — Other Ambulatory Visit: Payer: Self-pay | Admitting: Nurse Practitioner

## 2020-06-01 DIAGNOSIS — Z0001 Encounter for general adult medical examination with abnormal findings: Secondary | ICD-10-CM | POA: Diagnosis not present

## 2020-06-01 DIAGNOSIS — E559 Vitamin D deficiency, unspecified: Secondary | ICD-10-CM | POA: Diagnosis not present

## 2020-06-01 DIAGNOSIS — E782 Mixed hyperlipidemia: Secondary | ICD-10-CM | POA: Diagnosis not present

## 2020-06-01 DIAGNOSIS — I10 Essential (primary) hypertension: Secondary | ICD-10-CM | POA: Diagnosis not present

## 2020-06-02 LAB — COMPREHENSIVE METABOLIC PANEL
ALT: 18 IU/L (ref 0–32)
AST: 18 IU/L (ref 0–40)
Albumin/Globulin Ratio: 1.9 (ref 1.2–2.2)
Albumin: 4.3 g/dL (ref 3.7–4.7)
Alkaline Phosphatase: 68 IU/L (ref 44–121)
BUN/Creatinine Ratio: 13 (ref 12–28)
BUN: 12 mg/dL (ref 8–27)
Bilirubin Total: 0.5 mg/dL (ref 0.0–1.2)
CO2: 24 mmol/L (ref 20–29)
Calcium: 9.4 mg/dL (ref 8.7–10.3)
Chloride: 104 mmol/L (ref 96–106)
Creatinine, Ser: 0.94 mg/dL (ref 0.57–1.00)
GFR calc Af Amer: 71 mL/min/{1.73_m2} (ref >59)
GFR calc non Af Amer: 61 mL/min/{1.73_m2} (ref >59)
Globulin, Total: 2.3 g/dL (ref 1.5–4.5)
Glucose: 119 mg/dL — ABNORMAL HIGH (ref 65–99)
Potassium: 4.5 mmol/L (ref 3.5–5.2)
Sodium: 141 mmol/L (ref 134–144)
Total Protein: 6.6 g/dL (ref 6.0–8.5)

## 2020-06-02 LAB — LIPID PANEL WITH LDL/HDL RATIO
Cholesterol, Total: 122 mg/dL (ref 100–199)
HDL: 24 mg/dL — ABNORMAL LOW (ref >39)
LDL Chol Calc (NIH): 66 mg/dL (ref 0–99)
LDL/HDL Ratio: 2.8 ratio (ref 0.0–3.2)
Triglycerides: 190 mg/dL — ABNORMAL HIGH (ref 0–149)
VLDL Cholesterol Cal: 32 mg/dL (ref 5–40)

## 2020-06-02 LAB — CBC
Hematocrit: 40.8 % (ref 34.0–46.6)
Hemoglobin: 13.8 g/dL (ref 11.1–15.9)
MCH: 32.1 pg (ref 26.6–33.0)
MCHC: 33.8 g/dL (ref 31.5–35.7)
MCV: 95 fL (ref 79–97)
Platelets: 304 10*3/uL (ref 150–450)
RBC: 4.3 x10E6/uL (ref 3.77–5.28)
RDW: 12.8 % (ref 11.7–15.4)
WBC: 8.6 10*3/uL (ref 3.4–10.8)

## 2020-06-02 LAB — TSH: TSH: 4.76 u[IU]/mL — ABNORMAL HIGH (ref 0.450–4.500)

## 2020-06-02 LAB — T4, FREE: Free T4: 0.87 ng/dL (ref 0.82–1.77)

## 2020-06-02 LAB — VITAMIN D 25 HYDROXY (VIT D DEFICIENCY, FRACTURES): Vit D, 25-Hydroxy: 38.5 ng/mL (ref 30.0–100.0)

## 2020-06-04 DIAGNOSIS — B372 Candidiasis of skin and nail: Secondary | ICD-10-CM | POA: Insufficient documentation

## 2020-06-04 DIAGNOSIS — Z23 Encounter for immunization: Secondary | ICD-10-CM | POA: Insufficient documentation

## 2020-06-05 NOTE — Progress Notes (Signed)
Possible subclinical hypothyroid

## 2020-06-08 DIAGNOSIS — Z1231 Encounter for screening mammogram for malignant neoplasm of breast: Secondary | ICD-10-CM | POA: Diagnosis not present

## 2020-08-05 ENCOUNTER — Other Ambulatory Visit: Payer: Self-pay

## 2020-08-05 DIAGNOSIS — F411 Generalized anxiety disorder: Secondary | ICD-10-CM

## 2020-08-05 MED ORDER — SERTRALINE HCL 50 MG PO TABS: 50.0000 mg | ORAL_TABLET | Freq: Every day | ORAL | 0 refills | Status: DC

## 2020-08-08 ENCOUNTER — Other Ambulatory Visit: Payer: Self-pay

## 2020-08-08 MED ORDER — ROSUVASTATIN CALCIUM 20 MG PO TABS: ORAL_TABLET | ORAL | 1 refills | Status: DC

## 2020-09-01 ENCOUNTER — Ambulatory Visit: Payer: PPO | Admitting: Physician Assistant

## 2020-09-01 ENCOUNTER — Other Ambulatory Visit: Payer: Self-pay

## 2020-09-01 ENCOUNTER — Other Ambulatory Visit: Payer: Self-pay | Admitting: Physician Assistant

## 2020-09-01 VITALS — BP 150/81 | HR 91 | Ht 64.0 in | Wt 161.0 lb

## 2020-09-01 DIAGNOSIS — N329 Bladder disorder, unspecified: Secondary | ICD-10-CM

## 2020-09-01 NOTE — Progress Notes (Signed)
09/01/2020 1:31 PM   Wendy Garza 20-Oct-1945 [id number]  CC: Chief Complaint  Patient presents with   Follow-up    HPI: Wendy Garza is a 75 y.o. female with PMH microscopic hematuria with negative work-up in 2017 and benign work-up in 2020; cystoscopy notable for bladder lesion consistent with cystitis cystica and negative urine cytology at that time who presents today for repeat UA and cytology.  Today she denies gross hematuria, dysuria, or flank pain over the past year.  She continues to experience urgency and urge incontinence, however she reports minimal bother with these and wears pads for security.  No acute concerns today.  In-office UA today positive for trace ketones and 2+ blood; urine microscopy with 3-10 RBCs/HPF and granular and hyaline casts.   PMH: Past Medical History:  Diagnosis Date   Arthritis    Asthma    Cancer ([place]) 2003   UTERINE, total Hysterectomy   COPD (chronic obstructive pulmonary disease) ([facility])    Depression    Hyperlipidemia    Hypertension    Shortness of breath dyspnea    DOE    Surgical History: Past Surgical History:  Procedure Laterality Date   ABDOMINAL HYSTERECTOMY     BREAST SURGERY     BIOPSY   CATARACT EXTRACTION W/PHACO Left 10/04/2015   Procedure: CATARACT EXTRACTION PHACO AND INTRAOCULAR LENS PLACEMENT ([place]);  Surgeon: Birder Robson, MD;  Location: [facility] ORS;  Service: Ophthalmology;  Laterality: Left;  [place]: 00:46.8 AP%: 21.7 CDE: 10.19 Lot # [id number]   CATARACT EXTRACTION W/PHACO Right 10/25/2015   Procedure: CATARACT EXTRACTION PHACO AND INTRAOCULAR LENS PLACEMENT ([place]);  Surgeon: Birder Robson, MD;  Location: [facility] ORS;  Service: Ophthalmology;  Laterality: Right;  [place] 00:48 AP% 25.2 CDE 12.16 fluid pack lot # [id number]   COLONOSCOPY WITH PROPOFOL N/A 05/28/2016   Procedure: COLONOSCOPY WITH PROPOFOL;  Surgeon: Manya Silvas, MD;  Location: [facility] ENDOSCOPY;  Service: Endoscopy;  Laterality: N/A;    CYSTOSCOPY WITH BIOPSY N/A 08/31/2019   Procedure: CYSTOSCOPY WITH bladder BIOPSY;  Surgeon: Hollice Espy, MD;  Location: [facility] ORS;  Service: Urology;  Laterality: N/A;   CYSTOSCOPY WITH FULGERATION N/A 08/31/2019   Procedure: CYSTOSCOPY WITH FULGERATION;  Surgeon: Hollice Espy, MD;  Location: [facility] ORS;  Service: Urology;  Laterality: N/A;    Home Medications:  Allergies as of 09/01/2020      Reactions   Aspirin Other (See Comments)   Patient cannot recall what happened   Crab [shellfish Allergy] Hives   Latex Rash   Nickel Rash      Medication List       Accurate as of September 01, 2020  1:31 PM. If you have any questions, ask your nurse or doctor.        STOP taking these medications   clotrimazole-betamethasone cream Commonly known as: LOTRISONE Stopped by: Debroah Loop, PA-C   ergocalciferol 1.25 MG (50000 UT) capsule Commonly known as: Drisdol Stopped by: Debroah Loop, PA-C   nitrofurantoin (macrocrystal-monohydrate) 100 MG capsule Commonly known as: MACROBID Stopped by: Debroah Loop, PA-C   oxybutynin 5 MG tablet Commonly known as: DITROPAN Stopped by: Debroah Loop, PA-C   Zoster Vaccine Adjuvanted injection Commonly known as: SHINGRIX Stopped by: Debroah Loop, PA-C     TAKE these medications   albuterol 108 (90 Base) MCG/ACT inhaler Commonly known as: VENTOLIN HFA Inhale 2 puffs into the lungs every 6 (six) hours as needed for wheezing or shortness of breath. Reported on 11/10/2015   budesonide-formoterol 80-4.5  MCG/ACT inhaler Commonly known as: SYMBICORT INHALE 2 PUFFS BY MOUTH TWICE A DAY   ibuprofen 200 MG tablet Commonly known as: ADVIL Take 600 mg by mouth every 8 (eight) hours as needed (for pain.).   metoprolol tartrate 50 MG tablet Commonly known as: LOPRESSOR TAKE 1/2 OF A TABLET BY MOUTH TWICE A DAY   rosuvastatin 20 MG tablet Commonly known as: CRESTOR TAKE 1 TABLET BY MOUTH DAILY FOR  HIGH CHOLESTEROL   sertraline 50 MG tablet Commonly known as: ZOLOFT Take 1 tablet (50 mg total) by mouth daily.       Allergies:  Allergies  Allergen Reactions   Aspirin Other (See Comments)    Patient cannot recall what happened   Crab [Shellfish Allergy] Hives   Latex Rash   Nickel Rash    Family History: Family History  Problem Relation Age of Onset   Kidney disease Mother    Kidney cancer Neg Hx    Prostate cancer Neg Hx     Social History:   reports that she has been smoking cigarettes. She has smoked for the past 25.00 years. She has never used smokeless tobacco. She reports that she does not drink alcohol and does not use drugs.  Physical Exam: BP (!) 150/81    Pulse 91    Ht 5\' 4"  (1.626 m)    Wt 161 lb (73 kg)    LMP  (LMP Unknown)    BMI 27.64 kg/m   Constitutional:  Alert and oriented, no acute distress, nontoxic appearing HEENT: [place], AT Cardiovascular: No clubbing, cyanosis, or edema Respiratory: Normal respiratory effort, no increased work of breathing Skin: No rashes, bruises or suspicious lesions Neurologic: Grossly intact, no focal deficits, moving all 4 extremities Psychiatric: Normal mood and affect  Laboratory Data: Results for orders placed or performed in visit on 09/01/20  Microscopic Examination   Urine  Result Value Ref Range   WBC, UA 0-5 0 - 5 /hpf   RBC 3-10 (A) 0 - 2 /hpf   Epithelial Cells (non renal) 0-10 0 - 10 /hpf   Renal Epithel, UA 0-10 (A) None seen /hpf   Casts Present (A) None seen /lpf   Cast Type Granular casts (A) N/A   Bacteria, UA Few None seen/Few  Urinalysis, Complete  Result Value Ref Range   Specific Gravity, UA 1.025 1.005 - 1.030   pH, UA 5.5 5.0 - 7.5   Color, UA Yellow Yellow   Appearance Ur Hazy (A) Clear   Leukocytes,UA Negative Negative   Protein,UA Negative Negative/Trace   Glucose, UA Negative Negative   Ketones, UA Trace (A) Negative   RBC, UA 2+ (A) Negative   Bilirubin, UA Negative  Negative   Urobilinogen, Ur 0.2 0.2 - 1.0 mg/dL   Nitrite, UA Negative Negative   Microscopic Examination See below:    Assessment & Plan:   1. Lesion of bladder Stable microscopic hematuria with no acute worsening over the past year.  We discussed the need for annual repeat UAs and serial hematuria work-ups every 2 to 5 years if her hematuria persists.  She expressed understanding.  We will send urine for cytology today and contact patient with results. - Urinalysis, Complete - Cytology, urine   Return in about 1 year (around 09/01/2021) for Repeat UA and cytology with Dr. 10/30/2021.  Apolinar Junes, PA-C  [facility] [address], [address] [place], [place] [phone]

## 2020-09-02 LAB — URINALYSIS, COMPLETE
Bilirubin, UA: NEGATIVE
Glucose, UA: NEGATIVE
Leukocytes,UA: NEGATIVE
Nitrite, UA: NEGATIVE
Protein,UA: NEGATIVE
Specific Gravity, UA: 1.025 (ref 1.005–1.030)
Urobilinogen, Ur: 0.2 mg/dL (ref 0.2–1.0)
pH, UA: 5.5 (ref 5.0–7.5)

## 2020-09-02 LAB — MICROSCOPIC EXAMINATION

## 2020-09-05 LAB — CYTOLOGY - NON PAP

## 2020-09-07 ENCOUNTER — Telehealth: Payer: Self-pay

## 2020-09-07 NOTE — Telephone Encounter (Signed)
Yes, as discussed in clinic she does have persistent microscopic hematuria.  No indication for urgent intervention or further work-up at this time.  We will continue annual UAs and repeat hematuria work-ups every 2 to 5 years.  She should follow-up with [place] sooner if she develops gross hematuria, dysuria, or flank pain.

## 2020-09-07 NOTE — Telephone Encounter (Signed)
Notified patient as advised, patient verbalized understanding.  

## 2020-09-07 NOTE — Telephone Encounter (Signed)
-----   Message from Debroah Loop, [place] sent at 09/07/2020  8:47 AM EST ----- Please let her know her urine cytology was negative, great news. Please keep her scheduled annual follow-up with Dr. Erlene Quan next year. ----- Message ----- From: Interface, Lab In [facility] Sent: 09/05/2020   4:15 PM EST To: Debroah Loop, PA-C

## 2020-09-07 NOTE — Telephone Encounter (Signed)
Notified patient of negative cytology results. Patient also asked about UA on same visit and was there any blood in her urine. Advised patient I would inquire and get back to her. Please advise.

## 2020-09-19 ENCOUNTER — Ambulatory Visit: Payer: PPO | Admitting: Hospice and Palliative Medicine

## 2020-09-23 ENCOUNTER — Ambulatory Visit (INDEPENDENT_AMBULATORY_CARE_PROVIDER_SITE_OTHER): Payer: PPO | Admitting: Hospice and Palliative Medicine

## 2020-09-23 ENCOUNTER — Encounter: Payer: Self-pay | Admitting: Hospice and Palliative Medicine

## 2020-09-23 VITALS — BP 128/73 | HR 93 | Temp 98.0°F | Resp 16 | Ht 64.0 in | Wt 161.0 lb

## 2020-09-23 DIAGNOSIS — R7301 Impaired fasting glucose: Secondary | ICD-10-CM

## 2020-09-23 DIAGNOSIS — R7989 Other specified abnormal findings of blood chemistry: Secondary | ICD-10-CM

## 2020-09-23 DIAGNOSIS — E782 Mixed hyperlipidemia: Secondary | ICD-10-CM | POA: Diagnosis not present

## 2020-09-23 DIAGNOSIS — Z1211 Encounter for screening for malignant neoplasm of colon: Secondary | ICD-10-CM | POA: Diagnosis not present

## 2020-09-23 DIAGNOSIS — I10 Essential (primary) hypertension: Secondary | ICD-10-CM | POA: Diagnosis not present

## 2020-09-23 NOTE — Progress Notes (Signed)
[facility] [place], [place] [address]  Internal MEDICINE  Office Visit Note  Patient Name: Wendy Garza  [id number]  [id number]  Date of Service: 09/26/2020  Chief Complaint  Patient presents with  . Follow-up  . Depression  . Hyperlipidemia  . Hypertension    HPI Patient is here for routine follow-up Overall, things have been going well Last office visit, complaints of ringworm--breakouts have resolved Had follow-up with urology earlier this month for gross hematuria as well as bladder lesion/cystitis cystica--stable at this time, no further work-up warranted, UA positive for ketones as well as RBC Reviewed recent labs--elevated glucose level, abnormal lipid panel as well as elevated TSH with borderline normal T4  No complaints today--sleeping well, no changes in appetite Breathing remains well controlled Anxiety and depression well controlled on Zoloft  [id number] completed 2017 recommended 5 year repeat screening   Current Medication: Outpatient Encounter Medications as of 09/23/2020  Medication Sig  . albuterol (VENTOLIN HFA) 108 (90 Base) MCG/ACT inhaler Inhale 2 puffs into the lungs every 6 (six) hours as needed for wheezing or shortness of breath. Reported on 11/10/2015 (Patient taking differently: Inhale 2 puffs into the lungs every 6 (six) hours as needed for wheezing or shortness of breath. Reported on 11/10/2015)  . budesonide-formoterol (SYMBICORT) 80-4.5 MCG/ACT inhaler INHALE 2 PUFFS BY MOUTH TWICE A DAY  . ibuprofen (ADVIL) 200 MG tablet Take 600 mg by mouth every 8 (eight) hours as needed (for pain.).  Marland Kitchen metoprolol tartrate (LOPRESSOR) 50 MG tablet TAKE 1/2 OF A TABLET BY MOUTH TWICE A DAY  . rosuvastatin (CRESTOR) 20 MG tablet TAKE 1 TABLET BY MOUTH DAILY FOR HIGH CHOLESTEROL  . sertraline (ZOLOFT) 50 MG tablet Take 1 tablet (50 mg total) by mouth daily.   No facility-administered encounter medications on file as of 09/23/2020.     Surgical History: Past Surgical History:  Procedure Laterality Date  . ABDOMINAL HYSTERECTOMY    . BREAST SURGERY     BIOPSY  . CATARACT EXTRACTION W/PHACO Left 10/04/2015   Procedure: CATARACT EXTRACTION PHACO AND INTRAOCULAR LENS PLACEMENT (IOC);  Surgeon: Birder Robson, MD;  Location: [facility] ORS;  Service: Ophthalmology;  Laterality: Left;  [place]: 00:46.8 AP%: 21.7 CDE: 10.19 Lot # [id number]  . CATARACT EXTRACTION W/PHACO Right 10/25/2015   Procedure: CATARACT EXTRACTION PHACO AND INTRAOCULAR LENS PLACEMENT ([place]);  Surgeon: Birder Robson, MD;  Location: [facility] ORS;  Service: Ophthalmology;  Laterality: Right;  [place] 00:48 AP% 25.2 CDE 12.16 fluid pack lot # [id number]  . COLONOSCOPY WITH PROPOFOL N/A 05/28/2016   Procedure: COLONOSCOPY WITH PROPOFOL;  Surgeon: Manya Silvas, MD;  Location: [facility] ENDOSCOPY;  Service: Endoscopy;  Laterality: N/A;  . CYSTOSCOPY WITH BIOPSY N/A 08/31/2019   Procedure: CYSTOSCOPY WITH bladder BIOPSY;  Surgeon: Hollice Espy, MD;  Location: [facility] ORS;  Service: Urology;  Laterality: N/A;  . CYSTOSCOPY WITH FULGERATION N/A 08/31/2019   Procedure: CYSTOSCOPY WITH FULGERATION;  Surgeon: Hollice Espy, MD;  Location: [facility] ORS;  Service: Urology;  Laterality: N/A;    Medical History: Past Medical History:  Diagnosis Date  . Arthritis   . Asthma   . Cancer [facility]) 2003   UTERINE, total Hysterectomy  . COPD (chronic obstructive pulmonary disease) ([place])   . Depression   . Hyperlipidemia   . Hypertension   . Shortness of breath dyspnea    DOE    Family History: Family History  Problem Relation Age of Onset  . Kidney disease Mother   . Kidney  cancer Neg Hx   . Prostate cancer Neg Hx     Social History   Socioeconomic History  . Marital status: Single    Spouse name: Not on file  . Number of children: Not on file  . Years of education: Not on file  . Highest education level: Not on file  Occupational History  . Not on file  Tobacco Use  .  Smoking status: Current Every Day Smoker    Years: 25.00    Types: Cigarettes  . Smokeless tobacco: Never Used  . Tobacco comment: 3 or 4 cigarettes a day-reported on 11/17/19  Vaping Use  . Vaping Use: Never used  Substance and Sexual Activity  . Alcohol use: No    Alcohol/week: 0.0 standard drinks  . Drug use: No  . Sexual activity: Not on file  Other Topics Concern  . Not on file  Social History Narrative  . Not on file   Social Determinants of Health   Financial Resource Strain: Not on file  Food Insecurity: Not on file  Transportation Needs: Not on file  Physical Activity: Not on file  Stress: Not on file  Social Connections: Not on file  Intimate Partner Violence: Not on file      Review of Systems  Constitutional: Negative for chills, diaphoresis and fatigue.  HENT: Negative for ear pain, postnasal drip and sinus pressure.   Eyes: Negative for photophobia, discharge, redness, itching and visual disturbance.  Respiratory: Negative for cough, shortness of breath and wheezing.   Cardiovascular: Negative for chest pain, palpitations and leg swelling.  Gastrointestinal: Negative for abdominal pain, constipation, diarrhea, nausea and vomiting.  Genitourinary: Negative for dysuria and flank pain.  Musculoskeletal: Negative for arthralgias, back pain, gait problem and neck pain.  Skin: Negative for color change.  Allergic/Immunologic: Negative for environmental allergies and food allergies.  Neurological: Negative for dizziness and headaches.  Hematological: Does not bruise/bleed easily.  Psychiatric/Behavioral: Negative for agitation, behavioral problems (depression) and hallucinations.    Vital Signs: BP 128/73   Pulse 93   Temp 98 F (36.7 C)   Resp 16   Ht 5\' 4"  (1.626 m)   Wt 161 lb (73 kg)   LMP  (LMP Unknown)   SpO2 98%   BMI 27.64 kg/m    Physical Exam Vitals reviewed.  Constitutional:      Appearance: Normal appearance. She is normal weight.   Cardiovascular:     Rate and Rhythm: Normal rate and regular rhythm.     Pulses: Normal pulses.     Heart sounds: Normal heart sounds.  Pulmonary:     Effort: Pulmonary effort is normal.     Breath sounds: Normal breath sounds.  Abdominal:     General: Abdomen is flat.  Musculoskeletal:        General: Normal range of motion.     Cervical back: Normal range of motion.  Skin:    General: Skin is warm.  Neurological:     General: No focal deficit present.     Mental Status: She is alert and oriented to person, place, and time. Mental status is at baseline.  Psychiatric:        Mood and Affect: Mood normal.        Behavior: Behavior normal.        Thought Content: Thought content normal.        Judgment: Judgment normal.    Assessment/Plan: 1. Impaired fasting glucose Will send for A1C--treatment will need to be  initiated based on results History of IFG, no A1C on file - HgB A1c  2. Encounter for screening colonoscopy - Ambulatory referral to Gastroenterology  3. Elevated TSH Will repeat labs--consider treatment for subclinical hypothyroidism based on results - TSH + free T4  4. Essential hypertension BP and HR well controlled, continue with metoprolol at this time  5. Mixed hyperlipidemia Continue with Crestor daily  General Counseling: marisabel macpherson understanding of the findings of todays visit and agrees with plan of treatment. I have discussed any further diagnostic evaluation that may be needed or ordered today. We also reviewed her medications today. she has been encouraged to call the office with any questions or concerns that should arise related to todays visit.    Orders Placed This Encounter  Procedures  . HgB A1c  . TSH + free T4  . Ambulatory referral to Gastroenterology   Time spent: 30 Minutes Time spent includes review of chart, medications, test results and follow-up plan with the patient.  This patient was seen by Theodoro Grist AGNP-C in  Collaboration with Dr Lavera Guise as a part of collaborative care agreement     Tanna Furry. Xochil Shanker AGNP-C Internal medicine

## 2020-09-26 ENCOUNTER — Ambulatory Visit: Payer: PPO | Admitting: Hospice and Palliative Medicine

## 2020-09-26 ENCOUNTER — Telehealth: Payer: Self-pay

## 2020-09-26 ENCOUNTER — Encounter: Payer: Self-pay | Admitting: Hospice and Palliative Medicine

## 2020-09-26 NOTE — Telephone Encounter (Signed)
-----   Message from Luiz Ochoa, NP sent at 09/26/2020  8:16 AM EST ----- Please call patient and let her know that I have ordered some labs I would like for her to go and have done. Thanks.

## 2020-09-26 NOTE — Telephone Encounter (Signed)
Spoke with pt and informed her of blood work that needs to be done.

## 2020-09-29 ENCOUNTER — Telehealth (INDEPENDENT_AMBULATORY_CARE_PROVIDER_SITE_OTHER): Payer: Self-pay | Admitting: Gastroenterology

## 2020-09-29 ENCOUNTER — Other Ambulatory Visit: Payer: Self-pay

## 2020-09-29 DIAGNOSIS — Z86010 Personal history of colonic polyps: Secondary | ICD-10-CM

## 2020-09-29 DIAGNOSIS — R7301 Impaired fasting glucose: Secondary | ICD-10-CM | POA: Diagnosis not present

## 2020-09-29 DIAGNOSIS — R7989 Other specified abnormal findings of blood chemistry: Secondary | ICD-10-CM | POA: Diagnosis not present

## 2020-09-29 NOTE — Progress Notes (Signed)
Gastroenterology Pre-Procedure Review  Request Date: 07/03/21 Requesting Physician: Dr. Marius Ditch  PATIENT REVIEW QUESTIONS: The patient responded to the following health history questions as indicated:    1. Are you having any GI issues? no 2. Do you have a personal history of Polyps? yes (05/28/16 performed by Dr. Vira Agar) 3. Do you have a family history of Colon Cancer or Polyps? yes (father had stomach cancer) 4. Diabetes Mellitus? no 5. Joint replacements in the past 12 months?no 6. Major health problems in the past 3 months?no 7. Any artificial heart valves, MVP, or defibrillator?no    MEDICATIONS & ALLERGIES:    Patient reports the following regarding taking any anticoagulation/antiplatelet therapy:   Plavix, Coumadin, Eliquis, Xarelto, Lovenox, Pradaxa, Brilinta, or Effient? no Aspirin? no  Patient confirms/reports the following medications:  Current Outpatient Medications  Medication Sig Dispense Refill  . albuterol (VENTOLIN HFA) 108 (90 Base) MCG/ACT inhaler Inhale 2 puffs into the lungs every 6 (six) hours as needed for wheezing or shortness of breath. Reported on 11/10/2015 (Patient taking differently: Inhale 2 puffs into the lungs every 6 (six) hours as needed for wheezing or shortness of breath. Reported on 11/10/2015) 18 g 5  . budesonide-formoterol (SYMBICORT) 80-4.5 MCG/ACT inhaler INHALE 2 PUFFS BY MOUTH TWICE A DAY 30.6 Inhaler 3  . ibuprofen (ADVIL) 200 MG tablet Take 600 mg by mouth every 8 (eight) hours as needed (for pain.).    Marland Kitchen metoprolol tartrate (LOPRESSOR) 50 MG tablet TAKE 1/2 OF A TABLET BY MOUTH TWICE A DAY 60 tablet 5  . rosuvastatin (CRESTOR) 20 MG tablet TAKE 1 TABLET BY MOUTH DAILY FOR HIGH CHOLESTEROL 90 tablet 1  . sertraline (ZOLOFT) 50 MG tablet Take 1 tablet (50 mg total) by mouth daily. 90 tablet 0   No current facility-administered medications for this visit.    Patient confirms/reports the following allergies:  Allergies  Allergen Reactions  .  Aspirin Other (See Comments)    Patient cannot recall what happened  . Crab [Shellfish Allergy] Hives  . Latex Rash  . Nickel Rash    No orders of the defined types were placed in this encounter.   AUTHORIZATION INFORMATION Primary Insurance: 1D#: Group #:  Secondary Insurance: 1D#: Group #:  SCHEDULE INFORMATION: Date: Monday 07/03/21 Time: Location:[facility]

## 2020-09-30 ENCOUNTER — Telehealth: Payer: Self-pay

## 2020-09-30 LAB — TSH+FREE T4
Free T4: 0.94 ng/dL (ref 0.82–1.77)
TSH: 4.55 u[IU]/mL — ABNORMAL HIGH (ref 0.450–4.500)

## 2020-09-30 LAB — HEMOGLOBIN A1C
Est. average glucose Bld gHb Est-mCnc: 120 mg/dL
Hgb A1c MFr Bld: 5.8 % — ABNORMAL HIGH (ref 4.8–5.6)

## 2020-09-30 NOTE — Telephone Encounter (Signed)
PT notified

## 2020-09-30 NOTE — Telephone Encounter (Signed)
-----   Message from Luiz Ochoa, NP sent at 09/30/2020  2:44 PM EST ----- Reviewed, labs will need to be seen in office to discuss results.

## 2020-09-30 NOTE — Progress Notes (Signed)
Reviewed, labs will need to be seen in office to discuss results.

## 2020-10-05 ENCOUNTER — Other Ambulatory Visit: Payer: Self-pay

## 2020-10-05 ENCOUNTER — Ambulatory Visit (INDEPENDENT_AMBULATORY_CARE_PROVIDER_SITE_OTHER): Payer: PPO | Admitting: Hospice and Palliative Medicine

## 2020-10-05 ENCOUNTER — Encounter: Payer: Self-pay | Admitting: Hospice and Palliative Medicine

## 2020-10-05 VITALS — BP 136/70 | HR 90 | Temp 97.5°F | Resp 16 | Ht 64.0 in | Wt 161.6 lb

## 2020-10-05 DIAGNOSIS — R7301 Impaired fasting glucose: Secondary | ICD-10-CM | POA: Diagnosis not present

## 2020-10-05 DIAGNOSIS — E038 Other specified hypothyroidism: Secondary | ICD-10-CM | POA: Diagnosis not present

## 2020-10-05 DIAGNOSIS — I700 Atherosclerosis of aorta: Secondary | ICD-10-CM

## 2020-10-05 DIAGNOSIS — E039 Hypothyroidism, unspecified: Secondary | ICD-10-CM

## 2020-10-05 MED ORDER — LEVOTHYROXINE SODIUM 50 MCG PO TABS: 50.0000 ug | ORAL_TABLET | Freq: Every day | ORAL | 0 refills | Status: DC

## 2020-10-05 NOTE — Progress Notes (Signed)
[facility] [place], [place] [address]  Internal MEDICINE  Office Visit Note  Patient Name: Wendy Garza  [id number]  [id number]  Date of Service: 10/11/2020  Chief Complaint  Patient presents with  . Follow-up    Discuss labs  . Hyperlipidemia  . Depression  . COPD  . Asthma    HPI Patient is here for routine follow-up Reviewed recent labs--elevated glucose, abnormal lipid panel, elevated TSH, borderline A1C--follow up from past impaired fasting glucose levels and previously elevated TSH with borderline T4 Continues to feel well without acute complaints Breathing remains stable--last PFT 2020-mild restrictive and mild obstructive lung disease  Current Medication: Outpatient Encounter Medications as of 10/05/2020  Medication Sig  . albuterol (VENTOLIN HFA) 108 (90 Base) MCG/ACT inhaler Inhale 2 puffs into the lungs every 6 (six) hours as needed for wheezing or shortness of breath. Reported on 11/10/2015 (Patient taking differently: Inhale 2 puffs into the lungs every 6 (six) hours as needed for wheezing or shortness of breath. Reported on 11/10/2015)  . budesonide-formoterol (SYMBICORT) 80-4.5 MCG/ACT inhaler INHALE 2 PUFFS BY MOUTH TWICE A DAY  . ibuprofen (ADVIL) 200 MG tablet Take 600 mg by mouth every 8 (eight) hours as needed (for pain.).  Marland Kitchen levothyroxine (SYNTHROID) 50 MCG tablet Take 1 tablet (50 mcg total) by mouth daily before breakfast.  . metoprolol tartrate (LOPRESSOR) 50 MG tablet TAKE 1/2 OF A TABLET BY MOUTH TWICE A DAY  . rosuvastatin (CRESTOR) 20 MG tablet TAKE 1 TABLET BY MOUTH DAILY FOR HIGH CHOLESTEROL  . sertraline (ZOLOFT) 50 MG tablet Take 1 tablet (50 mg total) by mouth daily.   No facility-administered encounter medications on file as of 10/05/2020.    Surgical History: Past Surgical History:  Procedure Laterality Date  . ABDOMINAL HYSTERECTOMY    . BREAST SURGERY     BIOPSY  . CATARACT EXTRACTION W/PHACO Left 10/04/2015    Procedure: CATARACT EXTRACTION PHACO AND INTRAOCULAR LENS PLACEMENT (IOC);  Surgeon: Birder Robson, MD;  Location: [facility] ORS;  Service: Ophthalmology;  Laterality: Left;  [place]: 00:46.8 AP%: 21.7 CDE: 10.19 Lot # [id number]  . CATARACT EXTRACTION W/PHACO Right 10/25/2015   Procedure: CATARACT EXTRACTION PHACO AND INTRAOCULAR LENS PLACEMENT ([place]);  Surgeon: Birder Robson, MD;  Location: [facility] ORS;  Service: Ophthalmology;  Laterality: Right;  [place] 00:48 AP% 25.2 CDE 12.16 fluid pack lot # [id number]  . COLONOSCOPY WITH PROPOFOL N/A 05/28/2016   Procedure: COLONOSCOPY WITH PROPOFOL;  Surgeon: Manya Silvas, MD;  Location: [facility] ENDOSCOPY;  Service: Endoscopy;  Laterality: N/A;  . CYSTOSCOPY WITH BIOPSY N/A 08/31/2019   Procedure: CYSTOSCOPY WITH bladder BIOPSY;  Surgeon: Hollice Espy, MD;  Location: [facility] ORS;  Service: Urology;  Laterality: N/A;  . CYSTOSCOPY WITH FULGERATION N/A 08/31/2019   Procedure: CYSTOSCOPY WITH FULGERATION;  Surgeon: Hollice Espy, MD;  Location: [facility] ORS;  Service: Urology;  Laterality: N/A;    Medical History: Past Medical History:  Diagnosis Date  . Arthritis   . Asthma   . Cancer [facility]) 2003   UTERINE, total Hysterectomy  . COPD (chronic obstructive pulmonary disease) ([place])   . Depression   . Hyperlipidemia   . Hypertension   . Shortness of breath dyspnea    DOE    Family History: Family History  Problem Relation Age of Onset  . Kidney disease Mother   . Kidney cancer Neg Hx   . Prostate cancer Neg Hx     Social History   Socioeconomic History  . Marital  status: Single    Spouse name: Not on file  . Number of children: Not on file  . Years of education: Not on file  . Highest education level: Not on file  Occupational History  . Not on file  Tobacco Use  . Smoking status: Current Every Day Smoker    Years: 25.00    Types: Cigarettes  . Smokeless tobacco: Never Used  . Tobacco comment: 3 or 4 cigarettes a day-reported on 11/17/19  Vaping Use   . Vaping Use: Never used  Substance and Sexual Activity  . Alcohol use: No    Alcohol/week: 0.0 standard drinks  . Drug use: No  . Sexual activity: Not on file  Other Topics Concern  . Not on file  Social History Narrative  . Not on file   Social Determinants of Health   Financial Resource Strain: Not on file  Food Insecurity: Not on file  Transportation Needs: Not on file  Physical Activity: Not on file  Stress: Not on file  Social Connections: Not on file  Intimate Partner Violence: Not on file      Review of Systems  Constitutional: Negative for chills, diaphoresis and fatigue.  HENT: Negative for ear pain, postnasal drip and sinus pressure.   Eyes: Negative for photophobia, discharge, redness, itching and visual disturbance.  Respiratory: Negative for cough, shortness of breath and wheezing.   Cardiovascular: Negative for chest pain, palpitations and leg swelling.  Gastrointestinal: Negative for abdominal pain, constipation, diarrhea, nausea and vomiting.  Genitourinary: Negative for dysuria and flank pain.  Musculoskeletal: Negative for arthralgias, back pain, gait problem and neck pain.  Skin: Negative for color change.  Allergic/Immunologic: Negative for environmental allergies and food allergies.  Neurological: Negative for dizziness and headaches.  Hematological: Does not bruise/bleed easily.  Psychiatric/Behavioral: Negative for agitation, behavioral problems (depression) and hallucinations.    Vital Signs: BP 136/70   Pulse 90   Temp (!) 97.5 F (36.4 C)   Resp 16   Ht 5\' 4"  (1.626 m)   Wt 161 lb 9.6 oz (73.3 kg)   LMP  (LMP Unknown)   SpO2 97%   BMI 27.74 kg/m    Physical Exam Vitals reviewed.  Constitutional:      Appearance: Normal appearance. She is normal weight.  Cardiovascular:     Rate and Rhythm: Normal rate and regular rhythm.     Pulses: Normal pulses.     Heart sounds: Normal heart sounds.  Pulmonary:     Effort: Pulmonary effort  is normal.     Breath sounds: Normal breath sounds.  Abdominal:     General: Abdomen is flat.     Palpations: Abdomen is soft.  Musculoskeletal:        General: Normal range of motion.     Cervical back: Normal range of motion.  Skin:    General: Skin is warm.  Neurological:     General: No focal deficit present.     Mental Status: She is alert and oriented to person, place, and time. Mental status is at baseline.  Psychiatric:        Mood and Affect: Mood normal.        Behavior: Behavior normal.        Thought Content: Thought content normal.        Judgment: Judgment normal.    Assessment/Plan: 1. Subclinical hypothyroidism Start low dose levothyroxine Will repeat levels in 6 weeks Discussed appropriate way to take levothyroxine without food mixing with other  medications - levothyroxine (SYNTHROID) 50 MCG tablet; Take 1 tablet (50 mcg total) by mouth daily before breakfast.  Dispense: 90 tablet; Refill: 0 - TSH + free T4  2. Impaired fasting glucose A1C borderline at 5.8, discussed healthy lifestyle habits such as limiting carbs and sugar intake and to slowly increase activity levels  3. Atherosclerosis of aorta ([place]) Continue with statin therapy and add 81 mg asa daily  General Counseling: ladana chavero understanding of the findings of todays visit and agrees with plan of treatment. I have discussed any further diagnostic evaluation that may be needed or ordered today. We also reviewed her medications today. she has been encouraged to call the office with any questions or concerns that should arise related to todays visit.    Orders Placed This Encounter  Procedures  . TSH + free T4    Meds ordered this encounter  Medications  . levothyroxine (SYNTHROID) 50 MCG tablet    Sig: Take 1 tablet (50 mcg total) by mouth daily before breakfast.    Dispense:  90 tablet    Refill:  0    Time spent: 30 Minutes Time spent includes review of chart, medications, test  results and follow-up plan with the patient.  This patient was seen by Theodoro Grist AGNP-C in Collaboration with Dr Lavera Guise as a part of collaborative care agreement     Tanna Furry. Kemiya Batdorf AGNP-C Internal medicine

## 2020-10-11 ENCOUNTER — Encounter: Payer: Self-pay | Admitting: Hospice and Palliative Medicine

## 2020-10-30 ENCOUNTER — Other Ambulatory Visit: Payer: Self-pay | Admitting: Nurse Practitioner

## 2020-10-30 DIAGNOSIS — F411 Generalized anxiety disorder: Secondary | ICD-10-CM

## 2020-11-09 ENCOUNTER — Other Ambulatory Visit: Payer: Self-pay

## 2020-11-09 DIAGNOSIS — F411 Generalized anxiety disorder: Secondary | ICD-10-CM

## 2020-11-09 MED ORDER — SERTRALINE HCL 50 MG PO TABS: 50.0000 mg | ORAL_TABLET | Freq: Every day | ORAL | 0 refills | Status: DC

## 2020-11-14 ENCOUNTER — Encounter: Payer: Self-pay | Admitting: Internal Medicine

## 2020-11-14 ENCOUNTER — Other Ambulatory Visit: Payer: Self-pay

## 2020-11-14 ENCOUNTER — Ambulatory Visit: Payer: PPO | Admitting: Internal Medicine

## 2020-11-14 VITALS — BP 138/78 | HR 87 | Temp 98.2°F | Resp 16 | Ht 64.0 in | Wt 159.2 lb

## 2020-11-14 DIAGNOSIS — R0602 Shortness of breath: Secondary | ICD-10-CM | POA: Diagnosis not present

## 2020-11-14 DIAGNOSIS — E039 Hypothyroidism, unspecified: Secondary | ICD-10-CM | POA: Diagnosis not present

## 2020-11-14 DIAGNOSIS — F17210 Nicotine dependence, cigarettes, uncomplicated: Secondary | ICD-10-CM | POA: Diagnosis not present

## 2020-11-14 DIAGNOSIS — J449 Chronic obstructive pulmonary disease, unspecified: Secondary | ICD-10-CM | POA: Diagnosis not present

## 2020-11-14 NOTE — Progress Notes (Unsigned)
[facility] [place], [place] [address]  Pulmonary Sleep Medicine   Office Visit Note  Patient Name: Wendy Garza DOB: 1946-04-18 MRN [id number]  Date of Service: 11/14/2020  Complaints/HPI: COPD she is smoking. She states still 3-5 cigs per day. Overall SOB about the same. She has not had any admissions to the hospital. No congestion no cough. She states that she is retired and has not had any other issues. She has had the covid vaccine. She has not had any sick contacts  ROS  General: (-) fever, (-) chills, (-) night sweats, (-) weakness Skin: (-) rashes, (-) itching,. Eyes: (-) visual changes, (-) redness, (-) itching. Nose and Sinuses: (-) nasal stuffiness or itchiness, (-) postnasal drip, (-) nosebleeds, (-) sinus trouble. Mouth and Throat: (-) sore throat, (-) hoarseness. Neck: (-) swollen glands, (-) enlarged thyroid, (-) neck pain. Respiratory: + cough, (-) bloody sputum, + shortness of breath, + wheezing. Cardiovascular: - ankle swelling, (-) chest pain. Lymphatic: (-) lymph node enlargement. Neurologic: (-) numbness, (-) tingling. Psychiatric: (-) anxiety, (-) depression   Current Medication: Outpatient Encounter Medications as of 11/14/2020  Medication Sig  . albuterol (VENTOLIN HFA) 108 (90 Base) MCG/ACT inhaler Inhale 2 puffs into the lungs every 6 (six) hours as needed for wheezing or shortness of breath. Reported on 11/10/2015 (Patient taking differently: Inhale 2 puffs into the lungs every 6 (six) hours as needed for wheezing or shortness of breath. Reported on 11/10/2015)  . budesonide-formoterol (SYMBICORT) 80-4.5 MCG/ACT inhaler INHALE 2 PUFFS BY MOUTH TWICE A DAY  . ibuprofen (ADVIL) 200 MG tablet Take 600 mg by mouth every 8 (eight) hours as needed (for pain.).  Marland Kitchen levothyroxine (SYNTHROID) 50 MCG tablet Take 1 tablet (50 mcg total) by mouth daily before breakfast.  . metoprolol tartrate (LOPRESSOR) 50 MG tablet TAKE 1/2 OF A TABLET BY  MOUTH TWICE A DAY  . rosuvastatin (CRESTOR) 20 MG tablet TAKE 1 TABLET BY MOUTH DAILY FOR HIGH CHOLESTEROL  . sertraline (ZOLOFT) 50 MG tablet Take 1 tablet (50 mg total) by mouth daily.   No facility-administered encounter medications on file as of 11/14/2020.    Surgical History: Past Surgical History:  Procedure Laterality Date  . ABDOMINAL HYSTERECTOMY    . BREAST SURGERY     BIOPSY  . CATARACT EXTRACTION W/PHACO Left 10/04/2015   Procedure: CATARACT EXTRACTION PHACO AND INTRAOCULAR LENS PLACEMENT (IOC);  Surgeon: Birder Robson, MD;  Location: [facility] ORS;  Service: Ophthalmology;  Laterality: Left;  [place]: 00:46.8 AP%: 21.7 CDE: 10.19 Lot # [id number]  . CATARACT EXTRACTION W/PHACO Right 10/25/2015   Procedure: CATARACT EXTRACTION PHACO AND INTRAOCULAR LENS PLACEMENT ([place]);  Surgeon: Birder Robson, MD;  Location: [facility] ORS;  Service: Ophthalmology;  Laterality: Right;  [place] 00:48 AP% 25.2 CDE 12.16 fluid pack lot # [id number]  . COLONOSCOPY WITH PROPOFOL N/A 05/28/2016   Procedure: COLONOSCOPY WITH PROPOFOL;  Surgeon: Manya Silvas, MD;  Location: [facility] ENDOSCOPY;  Service: Endoscopy;  Laterality: N/A;  . CYSTOSCOPY WITH BIOPSY N/A 08/31/2019   Procedure: CYSTOSCOPY WITH bladder BIOPSY;  Surgeon: Hollice Espy, MD;  Location: [facility] ORS;  Service: Urology;  Laterality: N/A;  . CYSTOSCOPY WITH FULGERATION N/A 08/31/2019   Procedure: CYSTOSCOPY WITH FULGERATION;  Surgeon: Hollice Espy, MD;  Location: [facility] ORS;  Service: Urology;  Laterality: N/A;    Medical History: Past Medical History:  Diagnosis Date  . Arthritis   . Asthma   . Cancer [facility]) 2003   UTERINE, total Hysterectomy  .  COPD (chronic obstructive pulmonary disease) ([place])   . Depression   . Hyperlipidemia   . Hypertension   . Shortness of breath dyspnea    DOE    Family History: Family History  Problem Relation Age of Onset  . Kidney disease Mother   . Kidney cancer Neg Hx   . Prostate cancer Neg Hx     Social  History: Social History   Socioeconomic History  . Marital status: Single    Spouse name: Not on file  . Number of children: Not on file  . Years of education: Not on file  . Highest education level: Not on file  Occupational History  . Not on file  Tobacco Use  . Smoking status: Current Every Day Smoker    Years: 25.00    Types: Cigarettes  . Smokeless tobacco: Never Used  . Tobacco comment: 3 or 4 cigarettes a day-reported on 11/14/20  Vaping Use  . Vaping Use: Never used  Substance and Sexual Activity  . Alcohol use: No    Alcohol/week: 0.0 standard drinks  . Drug use: No  . Sexual activity: Not on file  Other Topics Concern  . Not on file  Social History Narrative  . Not on file   Social Determinants of Health   Financial Resource Strain: Not on file  Food Insecurity: Not on file  Transportation Needs: Not on file  Physical Activity: Not on file  Stress: Not on file  Social Connections: Not on file  Intimate Partner Violence: Not on file    Vital Signs: Blood pressure 138/78, pulse 87, temperature 98.2 F (36.8 C), resp. rate 16, height 5\' 4"  (1.626 m), weight 159 lb 3.2 oz (72.2 kg), SpO2 97 %.  Examination: General Appearance: The patient is well-developed, well-nourished, and in no distress. Skin: Gross inspection of skin unremarkable. Head: normocephalic, no gross deformities. Eyes: no gross deformities noted. ENT: ears appear grossly normal no exudates. Neck: Supple. No thyromegaly. No LAD. Respiratory: no rhonchi noted at this time. Cardiovascular: Normal S1 and S2 without murmur or rub. Extremities: No cyanosis. pulses are equal. Neurologic: Alert and oriented. No involuntary movements.  LABS: Recent Results (from the past 2160 hour(s))  Urinalysis, Complete     Status: Abnormal   Collection Time: 09/01/20  1:20 PM  Result Value Ref Range   Specific Gravity, UA 1.025 1.005 - 1.030   pH, UA 5.5 5.0 - 7.5   Color, UA Yellow Yellow   Appearance  Ur Hazy (A) Clear   Leukocytes,UA Negative Negative   Protein,UA Negative Negative/Trace   Glucose, UA Negative Negative   Ketones, UA Trace (A) Negative   RBC, UA 2+ (A) Negative   Bilirubin, UA Negative Negative   Urobilinogen, Ur 0.2 0.2 - 1.0 mg/dL   Nitrite, UA Negative Negative   Microscopic Examination See below:   Microscopic Examination     Status: Abnormal   Collection Time: 09/01/20  1:20 PM   Urine  Result Value Ref Range   WBC, UA 0-5 0 - 5 /hpf   RBC 3-10 (A) 0 - 2 /hpf   Epithelial Cells (non renal) 0-10 0 - 10 /hpf   Renal Epithel, UA 0-10 (A) None seen /hpf   Casts Present (A) None seen /lpf   Cast Type Granular casts (A) N/A    Comment: Hyaline casts   Bacteria, UA Few None seen/Few  Cytology - Non PAP;     Status: None   Collection Time: 09/01/20  1:45 PM  Result Value Ref Range   CYTOLOGY - NON GYN      Cytology - Non PAP CASE: [id number] PATIENT: [facility] Non-Gynecological Cytology Report     Specimen Submitted: A. Urine  Clinical History: N32.9     DIAGNOSIS: A. URINE: - ADEQUATE FOR INTERPRETATION. - NEGATIVE FOR HIGH-GRADE UROTHELIAL CARCINOMA. - BENIGN UROTHELIAL CELLS, HEMOSIDERIN-LADEN MACROPHAGES, AND SQUAMOUS EPITHELIAL CELLS.  GROSS DESCRIPTION: A. Labeled: Urine, bladder wash Received: Fresh Collection time: 1:45 PM on 09/01/2020 Placed into formalin time: 1:45 PM on 09/01/2020 Volume: 35 mL Description of fluid and container in which it is received: Yellow screw top container with amber-colored fluid Cytospin slide(s) received: None  Specimen material submitted for: ThinPrep  The cell block material is fixed in formalin for 6 hours prior to processing.  Final Diagnosis performed by Bryan Lemma, MD.   Electronically signed 09/05/2020 3:58:43PM The electronic signature indicates that the named Attending Pathologist has evaluated the  specimen Technical component performed at [facility], [address], [place], [place]  [address] Lab: [phone] Dir: Rush Farmer, MD, MMM  Professional component performed at [facility], [facility], [place], [place], [place] [address] Lab: [id number] Dir: Dellia Nims. Rubinas, MD   HgB A1c     Status: Abnormal   Collection Time: 09/29/20 10:21 AM  Result Value Ref Range   Hgb A1c MFr Bld 5.8 (H) 4.8 - 5.6 %    Comment:          Prediabetes: 5.7 - 6.4          Diabetes: >6.4          Glycemic control for adults with diabetes: <7.0    Est. average glucose Bld gHb Est-mCnc 120 mg/dL  TSH + free T4     Status: Abnormal   Collection Time: 09/29/20 10:21 AM  Result Value Ref Range   TSH 4.550 (H) 0.450 - 4.500 uIU/mL   Free T4 0.94 0.82 - 1.77 ng/dL    Radiology: No results found.  No results found.  No results found.    Assessment and Plan: Patient Active Problem List   Diagnosis Date Noted  . Cutaneous candidiasis 06/04/2020  . Need for shingles vaccine 06/04/2020  . Hematuria 11/18/2019  . Atherosclerosis of aorta ([place]) 11/18/2019  . Encounter for general adult medical examination with abnormal findings 05/31/2019  . Benign essential tremor 05/31/2019  . Need for vaccination against Streptococcus pneumoniae using pneumococcal conjugate vaccine 7 05/31/2019  . Dysuria 05/31/2019  . Encounter for hepatitis C screening test for low risk patient 05/31/2019  . Screening for breast cancer 05/21/2018  . Needs flu shot 05/21/2018  . Obstructive chronic bronchitis without exacerbation ([place]) 12/03/2017  . Nicotine dependence, cigarettes, uncomplicated 96/29/5284  . Mild intermittent asthma without complication 13/24/4010  . Vitamin D deficiency 10/17/2017  . Mixed hyperlipidemia 10/08/2017  . Essential hypertension 10/08/2017  . Generalized anxiety disorder 10/08/2017  . SUI (stress urinary incontinence, female) 11/12/2015  . Microscopic hematuria 11/12/2015  . Urge incontinence 11/12/2015      1. SOB (shortness of breath) Post  COVID dyspnea persisting patient will get follow-up pulmonary functions to see if there has been any worsening of her underlying pulmonary disease. - Spirometry with Graph - Pulmonary function test; Future  2. Obstructive chronic bronchitis without exacerbation ([place]) Inhalers as ordered she unfortunately continues to smoke and cessation is highly recommended in her case. - Pulmonary function test; Future  3. Nicotine dependence, cigarettes, uncomplicated As already noted above cigarette smoking needs  to be discontinued had a very lengthy discussion with her regarding this.  General Counseling: I have discussed the findings of the evaluation and examination with Perline.  I have also discussed any further diagnostic evaluation thatmay be needed or ordered today. Naevia verbalizes understanding of the findings of todays visit. We also reviewed her medications today and discussed drug interactions and side effects including but not limited excessive drowsiness and altered mental states. We also discussed that there is always a risk not just to her but also people around her. she has been encouraged to call the office with any questions or concerns that should arise related to todays visit.  Orders Placed This Encounter  Procedures  . Spirometry with Graph    Order Specific Question:   Where should this test be performed?    Answer:   [facility]    Order Specific Question:   Basic spirometry    Answer:   Yes    Order Specific Question:   Spirometry pre & post bronchodilator    Answer:   No     Time spent: 12  I have personally obtained a history, examined the patient, evaluated laboratory and imaging results, formulated the assessment and plan and placed orders.    Allyne Gee, MD [facility] Pulmonary and Critical Care Sleep medicine

## 2020-11-14 NOTE — Patient Instructions (Signed)
Chronic Obstructive Pulmonary Disease  Chronic obstructive pulmonary disease (COPD) is a long-term (chronic) lung problem. When you have COPD, it is hard for air to get in and out of your lungs. Usually the condition gets worse over time, and your lungs will never return to normal. There are things you can do to keep yourself as healthy as possible. What are the causes?  Smoking. This is the most common cause.  Certain genes passed from parent to child (inherited). What increases the risk?  Being exposed to secondhand smoke from cigarettes, pipes, or cigars.  Being exposed to chemicals and other irritants, such as fumes and dust in the work environment.  Having chronic lung conditions or infections. What are the signs or symptoms?  Shortness of breath, especially during physical activity.  A long-term cough with a large amount of thick mucus. Sometimes, the cough may not have any mucus (dry cough).  Wheezing.  Breathing quickly.  Skin that looks gray or blue, especially in the fingers, toes, or lips.  Feeling tired (fatigue).  Weight loss.  Chest tightness.  Having infections often.  Episodes when breathing symptoms become much worse (exacerbations). At the later stages of this disease, you may have swelling in the ankles, feet, or legs. How is this treated?  Taking medicines.  Quitting smoking, if you smoke.  Rehabilitation. This includes steps to make your body work better. It may involve a team of specialists.  Doing exercises.  Making changes to your diet.  Using oxygen.  Lung surgery.  Lung transplant.  Comfort measures (palliative care). Follow these instructions at home: Medicines  Take over-the-counter and prescription medicines only as told by your doctor.  Talk to your doctor before taking any cough or allergy medicines. You may need to avoid medicines that cause your lungs to be dry. Lifestyle  If you smoke, stop smoking. Smoking makes the  problem worse.  Do not smoke or use any products that contain nicotine or tobacco. If you need help quitting, ask your doctor.  Avoid being around things that make your breathing worse. This may include smoke, chemicals, and fumes.  Stay active, but remember to rest as well.  Learn and use tips on how to manage stress and control your breathing.  Make sure you get enough sleep. Most adults need at least 7 hours of sleep every night.  Eat healthy foods. Eat smaller meals more often. Rest before meals. Controlled breathing Learn and use tips on how to control your breathing as told by your doctor. Try:  Breathing in (inhaling) through your nose for 1 second. Then, pucker your lips and breath out (exhale) through your lips for 2 seconds.  Putting one hand on your belly (abdomen). Breathe in slowly through your nose for 1 second. Your hand on your belly should move out. Pucker your lips and breathe out slowly through your lips. Your hand on your belly should move in as you breathe out.   Controlled coughing Learn and use controlled coughing to clear mucus from your lungs. Follow these steps: 1. Lean your head a little forward. 2. Breathe in deeply. 3. Try to hold your breath for 3 seconds. 4. Keep your mouth slightly open while coughing 2 times. 5. Spit any mucus out into a tissue. 6. Rest and do the steps again 1 or 2 times as needed. General instructions  Make sure you get all the shots (vaccines) that your doctor recommends. Ask your doctor about a flu shot and a pneumonia shot.    Use oxygen therapy and pulmonary rehabilitation if told by your doctor. If you need home oxygen therapy, ask your doctor if you should buy a tool to measure your oxygen level (oximeter).  Make a COPD action plan with your doctor. This helps you to know what to do if you feel worse than usual.  Manage any other conditions you have as told by your doctor.  Avoid going outside when it is very hot, cold, or  humid.  Avoid people who have a sickness you can catch (contagious).  Keep all follow-up visits. Contact a doctor if:  You cough up more mucus than usual.  There is a change in the color or thickness of the mucus.  It is harder to breathe than usual.  Your breathing is faster than usual.  You have trouble sleeping.  You need to use your medicines more often than usual.  You have trouble doing your normal activities such as getting dressed or walking around the house. Get help right away if:  You have shortness of breath while resting.  You have shortness of breath that stops you from: ? Being able to talk. ? Doing normal activities.  Your chest hurts for longer than 5 minutes.  Your skin color is more blue than usual.  Your pulse oximeter shows that you have low oxygen for longer than 5 minutes.  You have a fever.  You feel too tired to breathe normally. These symptoms may represent a serious problem that is an emergency. Do not wait to see if the symptoms will go away. Get medical help right away. Call your local emergency services (911 in the [place] Do not drive yourself to the hospital. Summary  Chronic obstructive pulmonary disease (COPD) is a long-term lung problem.  The way your lungs work will never return to normal. Usually the condition gets worse over time. There are things you can do to keep yourself as healthy as possible.  Take over-the-counter and prescription medicines only as told by your doctor.  If you smoke, stop. Smoking makes the problem worse. This information is not intended to replace advice given to you by your health care provider. Make sure you discuss any questions you have with your health care provider. Document Revised: 06/21/2020 Document Reviewed: 06/21/2020 Elsevier Patient Education  2021 [employer]   

## 2020-11-15 LAB — TSH+FREE T4
Free T4: 1.37 ng/dL (ref 0.82–1.77)
TSH: 1.08 u[IU]/mL (ref 0.450–4.500)

## 2020-11-15 NOTE — Progress Notes (Signed)
Labs reviewed, will discuss at upcoming visit.

## 2020-11-17 ENCOUNTER — Encounter: Payer: Self-pay | Admitting: Hospice and Palliative Medicine

## 2020-11-17 ENCOUNTER — Other Ambulatory Visit: Payer: Self-pay

## 2020-11-17 ENCOUNTER — Ambulatory Visit (INDEPENDENT_AMBULATORY_CARE_PROVIDER_SITE_OTHER): Payer: PPO | Admitting: Hospice and Palliative Medicine

## 2020-11-17 VITALS — BP 126/72 | HR 91 | Temp 97.8°F | Resp 16 | Ht 64.0 in | Wt 160.0 lb

## 2020-11-17 DIAGNOSIS — I10 Essential (primary) hypertension: Secondary | ICD-10-CM

## 2020-11-17 DIAGNOSIS — E038 Other specified hypothyroidism: Secondary | ICD-10-CM | POA: Diagnosis not present

## 2020-11-17 DIAGNOSIS — R7303 Prediabetes: Secondary | ICD-10-CM | POA: Diagnosis not present

## 2020-11-17 NOTE — Progress Notes (Signed)
[facility] [place], [place] [address]  Internal MEDICINE  Office Visit Note  Patient Name: Wendy Garza  [id number]  [id number]  Date of Service: 11/17/2020  Chief Complaint  Patient presents with  . Follow-up    Review labs  . Hyperlipidemia  . Hypertension  . Depression  . COPD  . Asthma    HPI Patient is here for routine follow-up Reviewed repeat thyroid levels since starting on low dose levothyroxine at last visit Levels now stable and WNL Tolerating medication well without negative side effects  Has been focusing on healthier diet, limiting carb and sugar intake due to A1C being slightly elevated at last visit   Current Medication: Outpatient Encounter Medications as of 11/17/2020  Medication Sig  . albuterol (VENTOLIN HFA) 108 (90 Base) MCG/ACT inhaler Inhale 2 puffs into the lungs every 6 (six) hours as needed for wheezing or shortness of breath. Reported on 11/10/2015 (Patient taking differently: Inhale 2 puffs into the lungs every 6 (six) hours as needed for wheezing or shortness of breath. Reported on 11/10/2015)  . budesonide-formoterol (SYMBICORT) 80-4.5 MCG/ACT inhaler INHALE 2 PUFFS BY MOUTH TWICE A DAY  . ibuprofen (ADVIL) 200 MG tablet Take 600 mg by mouth every 8 (eight) hours as needed (for pain.).  Marland Kitchen levothyroxine (SYNTHROID) 50 MCG tablet Take 1 tablet (50 mcg total) by mouth daily before breakfast.  . metoprolol tartrate (LOPRESSOR) 50 MG tablet TAKE 1/2 OF A TABLET BY MOUTH TWICE A DAY  . rosuvastatin (CRESTOR) 20 MG tablet TAKE 1 TABLET BY MOUTH DAILY FOR HIGH CHOLESTEROL  . sertraline (ZOLOFT) 50 MG tablet Take 1 tablet (50 mg total) by mouth daily.   No facility-administered encounter medications on file as of 11/17/2020.    Surgical History: Past Surgical History:  Procedure Laterality Date  . ABDOMINAL HYSTERECTOMY    . BREAST SURGERY     BIOPSY  . CATARACT EXTRACTION W/PHACO Left 10/04/2015   Procedure: CATARACT  EXTRACTION PHACO AND INTRAOCULAR LENS PLACEMENT (IOC);  Surgeon: Birder Robson, MD;  Location: [facility] ORS;  Service: Ophthalmology;  Laterality: Left;  [place]: 00:46.8 AP%: 21.7 CDE: 10.19 Lot # [id number]  . CATARACT EXTRACTION W/PHACO Right 10/25/2015   Procedure: CATARACT EXTRACTION PHACO AND INTRAOCULAR LENS PLACEMENT ([place]);  Surgeon: Birder Robson, MD;  Location: [facility] ORS;  Service: Ophthalmology;  Laterality: Right;  [place] 00:48 AP% 25.2 CDE 12.16 fluid pack lot # [id number]  . COLONOSCOPY WITH PROPOFOL N/A 05/28/2016   Procedure: COLONOSCOPY WITH PROPOFOL;  Surgeon: Manya Silvas, MD;  Location: [facility] ENDOSCOPY;  Service: Endoscopy;  Laterality: N/A;  . CYSTOSCOPY WITH BIOPSY N/A 08/31/2019   Procedure: CYSTOSCOPY WITH bladder BIOPSY;  Surgeon: Hollice Espy, MD;  Location: [facility] ORS;  Service: Urology;  Laterality: N/A;  . CYSTOSCOPY WITH FULGERATION N/A 08/31/2019   Procedure: CYSTOSCOPY WITH FULGERATION;  Surgeon: Hollice Espy, MD;  Location: [facility] ORS;  Service: Urology;  Laterality: N/A;    Medical History: Past Medical History:  Diagnosis Date  . Arthritis   . Asthma   . Cancer [facility]) 2003   UTERINE, total Hysterectomy  . COPD (chronic obstructive pulmonary disease) ([place])   . Depression   . Hyperlipidemia   . Hypertension   . Shortness of breath dyspnea    DOE    Family History: Family History  Problem Relation Age of Onset  . Kidney disease Mother   . Kidney cancer Neg Hx   . Prostate cancer Neg Hx     Social History  Socioeconomic History  . Marital status: Single    Spouse name: Not on file  . Number of children: Not on file  . Years of education: Not on file  . Highest education level: Not on file  Occupational History  . Not on file  Tobacco Use  . Smoking status: Current Every Day Smoker    Years: 25.00    Types: Cigarettes  . Smokeless tobacco: Never Used  . Tobacco comment: 3 or 4 cigarettes a day-reported on 11/14/20  Vaping Use  . Vaping Use:  Never used  Substance and Sexual Activity  . Alcohol use: No    Alcohol/week: 0.0 standard drinks  . Drug use: No  . Sexual activity: Not on file  Other Topics Concern  . Not on file  Social History Narrative  . Not on file   Social Determinants of Health   Financial Resource Strain: Not on file  Food Insecurity: Not on file  Transportation Needs: Not on file  Physical Activity: Not on file  Stress: Not on file  Social Connections: Not on file  Intimate Partner Violence: Not on file      Review of Systems  Constitutional: Negative for chills, diaphoresis and fatigue.  HENT: Negative for ear pain, postnasal drip and sinus pressure.   Eyes: Negative for photophobia, discharge, redness, itching and visual disturbance.  Respiratory: Negative for cough, shortness of breath and wheezing.   Cardiovascular: Negative for chest pain, palpitations and leg swelling.  Gastrointestinal: Negative for abdominal pain, constipation, diarrhea, nausea and vomiting.  Genitourinary: Negative for dysuria and flank pain.  Musculoskeletal: Negative for arthralgias, back pain, gait problem and neck pain.  Skin: Negative for color change.  Allergic/Immunologic: Negative for environmental allergies and food allergies.  Neurological: Negative for dizziness and headaches.  Hematological: Does not bruise/bleed easily.  Psychiatric/Behavioral: Negative for agitation, behavioral problems (depression) and hallucinations.    Vital Signs: BP 126/72   Pulse 91   Temp 97.8 F (36.6 C)   Resp 16   Ht 5\' 4"  (1.626 m)   Wt 160 lb (72.6 kg)   LMP  (LMP Unknown)   SpO2 97%   BMI 27.46 kg/m    Physical Exam Vitals reviewed.  Constitutional:      Appearance: Normal appearance. She is normal weight.  Cardiovascular:     Rate and Rhythm: Normal rate and regular rhythm.     Pulses: Normal pulses.     Heart sounds: Normal heart sounds.  Pulmonary:     Effort: Pulmonary effort is normal.     Breath  sounds: Normal breath sounds.  Abdominal:     General: Abdomen is flat.     Palpations: Abdomen is soft.  Musculoskeletal:        General: Normal range of motion.     Cervical back: Normal range of motion.  Skin:    General: Skin is warm.  Neurological:     General: No focal deficit present.     Mental Status: She is alert and oriented to person, place, and time. Mental status is at baseline.  Psychiatric:        Mood and Affect: Mood normal.        Behavior: Behavior normal.        Thought Content: Thought content normal.        Judgment: Judgment normal.    Assessment/Plan: 1. Subclinical hypothyroidism Levels stabilized since starting low dose levothyroxine, tolerating well, continue to monitor  2. Essential hypertension BP and HR  well controlled, continue to monitor  3. Prediabetes Encouraged to continue to focus on healthy diet and increasing activity levels, will repeat A1C at next visit  General Counseling: tavionna grout understanding of the findings of todays visit and agrees with plan of treatment. I have discussed any further diagnostic evaluation that may be needed or ordered today. We also reviewed her medications today. she has been encouraged to call the office with any questions or concerns that should arise related to todays visit.   Time spent: 30 Minutes Time spent includes review of chart, medications, test results and follow-up plan with the patient.  This patient was seen by Theodoro Grist AGNP-C in Collaboration with Dr Lavera Guise as a part of collaborative care agreement     Tanna Furry. Venson Ferencz AGNP-C Internal medicine

## 2020-11-18 ENCOUNTER — Encounter: Payer: Self-pay | Admitting: Hospice and Palliative Medicine

## 2020-12-14 ENCOUNTER — Ambulatory Visit: Payer: PPO | Admitting: Internal Medicine

## 2020-12-14 ENCOUNTER — Other Ambulatory Visit: Payer: Self-pay

## 2020-12-14 DIAGNOSIS — R0602 Shortness of breath: Secondary | ICD-10-CM

## 2020-12-14 DIAGNOSIS — J449 Chronic obstructive pulmonary disease, unspecified: Secondary | ICD-10-CM

## 2020-12-14 LAB — PULMONARY FUNCTION TEST

## 2020-12-20 NOTE — Procedures (Signed)
[facility] [address] [place], [address]    Complete Pulmonary Function Testing Interpretation:  FINDINGS:  Forced vital capacity is mildly decreased.  FEV1 is 1.65 L which is 75% of predicted and is mildly decreased.  Postbronchodilator there is no significant change in FEV1 clinical improvement may occur in the absence of spirometric improvement.  The FEV1 FVC ratio was mildly decreased.  Total lung capacity is mildly decreased residual volume is decreased residual internal capacity ratio is decreased FRC is decreased.  DLCO was also mildly decreased normalized when corrected for alveolar volume.  IMPRESSION:  This PFT is suggestive of mild obstructive and mild restrictive lung disease clinical correlation is recommended  Allyne Gee, MD [facility] Pulmonary Critical Care Medicine Sleep Medicine

## 2020-12-21 DIAGNOSIS — H26493 Other secondary cataract, bilateral: Secondary | ICD-10-CM | POA: Diagnosis not present

## 2021-01-02 ENCOUNTER — Other Ambulatory Visit: Payer: Self-pay | Admitting: Hospice and Palliative Medicine

## 2021-01-02 DIAGNOSIS — E038 Other specified hypothyroidism: Secondary | ICD-10-CM

## 2021-01-18 ENCOUNTER — Ambulatory Visit (INDEPENDENT_AMBULATORY_CARE_PROVIDER_SITE_OTHER): Payer: PPO | Admitting: Nurse Practitioner

## 2021-01-18 ENCOUNTER — Other Ambulatory Visit: Payer: Self-pay

## 2021-01-18 ENCOUNTER — Encounter: Payer: Self-pay | Admitting: Nurse Practitioner

## 2021-01-18 DIAGNOSIS — R7303 Prediabetes: Secondary | ICD-10-CM

## 2021-01-18 DIAGNOSIS — E782 Mixed hyperlipidemia: Secondary | ICD-10-CM

## 2021-01-18 DIAGNOSIS — E038 Other specified hypothyroidism: Secondary | ICD-10-CM | POA: Diagnosis not present

## 2021-01-18 LAB — POCT GLYCOSYLATED HEMOGLOBIN (HGB A1C): Hemoglobin A1C: 5.4 % (ref 4.0–5.6)

## 2021-01-18 NOTE — Progress Notes (Signed)
[facility] [place], [place] [address]  Internal MEDICINE  Office Visit Note  Patient Name: Wendy Garza  [id number]  [id number]  Date of Service: 01/20/2021  Chief Complaint  Patient presents with  . Follow-up    A1c   . Depression  . Hypertension  . Hyperlipidemia  . COPD    HPI Wendy Garza presents for a follow-up visit to recheck her A1c and discussed her depression, hypertension, hyperlipidemia, and chronic obstructive pulmonary disease. -Her A1c today is 5.4 which is normal.  She is currently not on any medications for diabetes.  She has been focusing on diet and moving more, and she likes to take walks. -She is currently taking levothyroxine 50 mcg daily.  Her TSH level in March 2022 was 1.08 and her free T4 was 1.37.  We will recheck her TSH and T4. -Wendy Garza is currently using Symbicort as her maintenance medication for COPD.  She is a non-smoker.  She reports that her breathing is well controlled with her current medication plan -She is taking rosuvastatin for her hyperlipidemia.  Her most recent lipid panel was drawn in October 2021 her triglycerides were elevated and her HDL was low all other values were normal.  We will need to recheck her lipid panel. -When asked about her depression she states she does not have any depression.  She is currently on sertraline, when asked about it she said she was put on that after having children and she has been taking it ever since.  She asked if she should continue taking sertraline.  When asking if it helps her she stated that it does.   Current Medication: Outpatient Encounter Medications as of 01/18/2021  Medication Sig  . albuterol (VENTOLIN HFA) 108 (90 Base) MCG/ACT inhaler Inhale 2 puffs into the lungs every 6 (six) hours as needed for wheezing or shortness of breath. Reported on 11/10/2015 (Patient taking differently: Inhale 2 puffs into the lungs every 6 (six) hours as needed for wheezing or shortness of  breath. Reported on 11/10/2015)  . budesonide-formoterol (SYMBICORT) 80-4.5 MCG/ACT inhaler INHALE 2 PUFFS BY MOUTH TWICE A DAY  . ibuprofen (ADVIL) 200 MG tablet Take 600 mg by mouth every 8 (eight) hours as needed (for pain.).  Marland Kitchen levothyroxine (SYNTHROID) 50 MCG tablet TAKE 1 TABLET BY MOUTH DAILY BEFORE BREAKFAST  . metoprolol tartrate (LOPRESSOR) 50 MG tablet TAKE 1/2 OF A TABLET BY MOUTH TWICE A DAY  . rosuvastatin (CRESTOR) 20 MG tablet TAKE 1 TABLET BY MOUTH DAILY FOR HIGH CHOLESTEROL  . sertraline (ZOLOFT) 50 MG tablet Take 1 tablet (50 mg total) by mouth daily.   No facility-administered encounter medications on file as of 01/18/2021.    Surgical History: Past Surgical History:  Procedure Laterality Date  . ABDOMINAL HYSTERECTOMY    . BREAST SURGERY     BIOPSY  . CATARACT EXTRACTION W/PHACO Left 10/04/2015   Procedure: CATARACT EXTRACTION PHACO AND INTRAOCULAR LENS PLACEMENT (IOC);  Surgeon: Birder Robson, MD;  Location: [facility] ORS;  Service: Ophthalmology;  Laterality: Left;  [place]: 00:46.8 AP%: 21.7 CDE: 10.19 Lot # [id number]  . CATARACT EXTRACTION W/PHACO Right 10/25/2015   Procedure: CATARACT EXTRACTION PHACO AND INTRAOCULAR LENS PLACEMENT ([place]);  Surgeon: Birder Robson, MD;  Location: [facility] ORS;  Service: Ophthalmology;  Laterality: Right;  [place] 00:48 AP% 25.2 CDE 12.16 fluid pack lot # [id number]  . COLONOSCOPY WITH PROPOFOL N/A 05/28/2016   Procedure: COLONOSCOPY WITH PROPOFOL;  Surgeon: Manya Silvas, MD;  Location: [facility] ENDOSCOPY;  Service: Endoscopy;  Laterality: N/A;  . CYSTOSCOPY WITH BIOPSY N/A 08/31/2019   Procedure: CYSTOSCOPY WITH bladder BIOPSY;  Surgeon: Hollice Espy, MD;  Location: [facility] ORS;  Service: Urology;  Laterality: N/A;  . CYSTOSCOPY WITH FULGERATION N/A 08/31/2019   Procedure: CYSTOSCOPY WITH FULGERATION;  Surgeon: Hollice Espy, MD;  Location: [facility] ORS;  Service: Urology;  Laterality: N/A;    Medical History: Past Medical History:  Diagnosis Date   . Arthritis   . Asthma   . Cancer [facility]) 2003   UTERINE, total Hysterectomy  . COPD (chronic obstructive pulmonary disease) ([place])   . Depression   . Hyperlipidemia   . Hypertension   . Shortness of breath dyspnea    DOE    Family History: Family History  Problem Relation Age of Onset  . Kidney disease Mother   . Kidney cancer Neg Hx   . Prostate cancer Neg Hx     Social History   Socioeconomic History  . Marital status: Single    Spouse name: Not on file  . Number of children: Not on file  . Years of education: Not on file  . Highest education level: Not on file  Occupational History  . Not on file  Tobacco Use  . Smoking status: Current Every Day Smoker    Years: 25.00    Types: Cigarettes  . Smokeless tobacco: Never Used  . Tobacco comment: 3 or 4 cigarettes a day-reported on 11/14/20  Vaping Use  . Vaping Use: Never used  Substance and Sexual Activity  . Alcohol use: No    Alcohol/week: 0.0 standard drinks  . Drug use: No  . Sexual activity: Not on file  Other Topics Concern  . Not on file  Social History Narrative  . Not on file   Social Determinants of Health   Financial Resource Strain: Not on file  Food Insecurity: Not on file  Transportation Needs: Not on file  Physical Activity: Not on file  Stress: Not on file  Social Connections: Not on file  Intimate Partner Violence: Not on file      Review of Systems  Constitutional: Negative for chills, fatigue and unexpected weight change.  HENT: Negative for congestion, rhinorrhea, sneezing and sore throat.   Eyes: Negative for redness.  Respiratory: Negative for cough, chest tightness and shortness of breath.   Cardiovascular: Negative for chest pain and palpitations.  Gastrointestinal: Negative for abdominal pain, constipation, diarrhea, nausea and vomiting.  Genitourinary: Negative for dysuria and frequency.  Musculoskeletal: Negative for arthralgias, back pain, joint swelling and neck pain.   Skin: Negative for rash.  Neurological: Negative.  Negative for tremors and numbness.  Hematological: Negative for adenopathy. Does not bruise/bleed easily.  Psychiatric/Behavioral: Negative for behavioral problems (Depression), sleep disturbance and suicidal ideas. The patient is not nervous/anxious.     Vital Signs: BP 128/72   Pulse 74   Temp 98.7 F (37.1 C)   Resp 16   Ht 5\' 4"  (1.626 m)   Wt 160 lb (72.6 kg)   LMP  (LMP Unknown)   SpO2 97%   BMI 27.46 kg/m    Physical Exam Vitals reviewed.  Constitutional:      General: She is not in acute distress.    Appearance: Normal appearance. She is well-developed and overweight. She is not ill-appearing or diaphoretic.  HENT:     Head: Normocephalic and atraumatic.     Mouth/Throat:     Pharynx: No oropharyngeal exudate.  Eyes:     Pupils:  Pupils are equal, round, and reactive to light.  Neck:     Thyroid: No thyromegaly.     Vascular: No JVD.     Trachea: No tracheal deviation.  Cardiovascular:     Rate and Rhythm: Normal rate and regular rhythm.     Heart sounds: Normal heart sounds. No murmur heard. No friction rub. No gallop.   Pulmonary:     Effort: Pulmonary effort is normal. No respiratory distress.     Breath sounds: No wheezing or rales.  Chest:     Chest wall: No tenderness.  Abdominal:     General: Bowel sounds are normal.     Palpations: Abdomen is soft.  Musculoskeletal:        General: Normal range of motion.     Cervical back: Normal range of motion and neck supple.  Lymphadenopathy:     Cervical: No cervical adenopathy.  Skin:    General: Skin is warm and dry.  Neurological:     Mental Status: She is alert and oriented to person, place, and time.     Cranial Nerves: No cranial nerve deficit.  Psychiatric:        Behavior: Behavior normal. Behavior is cooperative.        Thought Content: Thought content normal.        Judgment: Judgment normal.    Assessment/Plan: 1. Prediabetes A1c is  5.4 today. Patient is not on any diabetic medications. Discussed diet and physical activity and she will continue to focus on her diet and moving more.  - POCT HgB A1C  2. Subclinical hypothyroidism continue levothyroxine, lab ordered to check TSH and free T4.  - TSH + free T4  3. Mixed hyperlipidemia Patient is taking rosuvastatin for hyperlipidemia., no recent lipid panel, lipid profile ordered.  - Lipid Profile   General Counseling: Wendy Garza understanding of the findings of todays visit and agrees with plan of treatment. I have discussed any further diagnostic evaluation that may be needed or ordered today. We also reviewed her medications today. she has been encouraged to call the office with any questions or concerns that should arise related to todays visit.    Orders Placed This Encounter  Procedures  . TSH + free T4  . Lipid Profile  . POCT HgB A1C    No orders of the defined types were placed in this encounter. Return in about 6 weeks (around 03/01/2021) for F/U, med refill, Review labs/test, Wendy Garza PCP.   Total time spent:30 Minutes Time spent includes review of chart, medications, test results, and follow up plan with the patient.   [place] Controlled Substance Database was reviewed by me.  This patient was seen by [name], FNP-C in collaboration with Dr. Clayborn Bigness as a part of collaborative care agreement.  Dr Lavera Guise Internal medicine

## 2021-01-20 DIAGNOSIS — R7303 Prediabetes: Secondary | ICD-10-CM | POA: Insufficient documentation

## 2021-01-20 DIAGNOSIS — E038 Other specified hypothyroidism: Secondary | ICD-10-CM | POA: Insufficient documentation

## 2021-01-24 DIAGNOSIS — E782 Mixed hyperlipidemia: Secondary | ICD-10-CM | POA: Diagnosis not present

## 2021-01-24 DIAGNOSIS — E038 Other specified hypothyroidism: Secondary | ICD-10-CM | POA: Diagnosis not present

## 2021-01-25 LAB — TSH+FREE T4
Free T4: 1.27 ng/dL (ref 0.82–1.77)
TSH: 1.62 u[IU]/mL (ref 0.450–4.500)

## 2021-01-25 LAB — LIPID PANEL
Chol/HDL Ratio: 5.1 ratio — ABNORMAL HIGH (ref 0.0–4.4)
Cholesterol, Total: 127 mg/dL (ref 100–199)
HDL: 25 mg/dL — ABNORMAL LOW (ref >39)
LDL Chol Calc (NIH): 68 mg/dL (ref 0–99)
Triglycerides: 201 mg/dL — ABNORMAL HIGH (ref 0–149)
VLDL Cholesterol Cal: 34 mg/dL (ref 5–40)

## 2021-01-27 ENCOUNTER — Ambulatory Visit: Payer: PPO | Admitting: Hospice and Palliative Medicine

## 2021-02-08 ENCOUNTER — Other Ambulatory Visit: Payer: Self-pay | Admitting: Internal Medicine

## 2021-02-10 ENCOUNTER — Other Ambulatory Visit: Payer: Self-pay

## 2021-02-10 DIAGNOSIS — F411 Generalized anxiety disorder: Secondary | ICD-10-CM

## 2021-02-10 MED ORDER — SERTRALINE HCL 50 MG PO TABS
50.0000 mg | ORAL_TABLET | Freq: Every day | ORAL | 0 refills | Status: DC
Start: 2021-02-10 — End: 2021-05-04

## 2021-02-13 ENCOUNTER — Encounter: Payer: Self-pay | Admitting: Physician Assistant

## 2021-02-13 ENCOUNTER — Ambulatory Visit (INDEPENDENT_AMBULATORY_CARE_PROVIDER_SITE_OTHER): Payer: PPO | Admitting: Physician Assistant

## 2021-02-13 ENCOUNTER — Other Ambulatory Visit: Payer: Self-pay

## 2021-02-13 DIAGNOSIS — R0602 Shortness of breath: Secondary | ICD-10-CM | POA: Diagnosis not present

## 2021-02-13 DIAGNOSIS — J449 Chronic obstructive pulmonary disease, unspecified: Secondary | ICD-10-CM | POA: Diagnosis not present

## 2021-02-13 MED ORDER — ALBUTEROL SULFATE HFA 108 (90 BASE) MCG/ACT IN AERS: 2.0000 | INHALATION_SPRAY | Freq: Four times a day (QID) | RESPIRATORY_TRACT | 5 refills | Status: DC | PRN

## 2021-02-13 MED ORDER — BUDESONIDE-FORMOTEROL FUMARATE 80-4.5 MCG/ACT IN AERO: 2.0000 | INHALATION_SPRAY | Freq: Two times a day (BID) | RESPIRATORY_TRACT | 3 refills | Status: DC

## 2021-02-13 NOTE — Progress Notes (Signed)
[facility] [place], [place] [address]  Pulmonary Sleep Medicine   Office Visit Note  Patient Name: Wendy Garza DOB: 1945/10/01 MRN [id number]  Date of Service: 02/14/2021  Complaints/HPI: Pt is here for routine oulmonary follow up to review PFT results.  PFTs showed mild restrictive and obstructive lung disease with an FEV1 of 1.65 L, 75% of predicted.  This is improved from previous PFT on file in 2020.  Patient continues to use her Symbicort daily and has albuterol for use as needed however has not had to use it very often recently.  Overall feels her breathing is well controlled.  ROS  General: (-) fever, (-) chills, (-) night sweats, (-) weakness Skin: (-) rashes, (-) itching,. Eyes: (-) visual changes, (-) redness, (-) itching. Nose and Sinuses: (-) nasal stuffiness or itchiness, (-) postnasal drip, (-) nosebleeds, (-) sinus trouble. Mouth and Throat: (-) sore throat, (-) hoarseness. Neck: (-) swollen glands, (-) enlarged thyroid, (-) neck pain. Respiratory: - cough, (-) bloody sputum, - shortness of breath, - wheezing. Cardiovascular: - ankle swelling, (-) chest pain. Lymphatic: (-) lymph node enlargement. Neurologic: (-) numbness, (-) tingling. Psychiatric: (-) anxiety, (-) depression   Current Medication: Outpatient Encounter Medications as of 02/13/2021  Medication Sig   ibuprofen (ADVIL) 200 MG tablet Take 600 mg by mouth every 8 (eight) hours as needed (for pain.).   levothyroxine (SYNTHROID) 50 MCG tablet TAKE 1 TABLET BY MOUTH DAILY BEFORE BREAKFAST   metoprolol tartrate (LOPRESSOR) 50 MG tablet TAKE 1/2 OF A TABLET BY MOUTH TWICE A DAY   rosuvastatin (CRESTOR) 20 MG tablet TAKE 1 TABLET BY MOUTH DAILY FOR HIGH CHOLESTEROL   sertraline (ZOLOFT) 50 MG tablet Take 1 tablet (50 mg total) by mouth daily.   [DISCONTINUED] albuterol (VENTOLIN HFA) 108 (90 Base) MCG/ACT inhaler Inhale 2 puffs into the lungs every 6 (six) hours as needed for  wheezing or shortness of breath. Reported on 11/10/2015 (Patient taking differently: Inhale 2 puffs into the lungs every 6 (six) hours as needed for wheezing or shortness of breath. Reported on 11/10/2015)   [DISCONTINUED] budesonide-formoterol (SYMBICORT) 80-4.5 MCG/ACT inhaler INHALE 2 PUFFS BY MOUTH TWICE A DAY   albuterol (VENTOLIN HFA) 108 (90 Base) MCG/ACT inhaler Inhale 2 puffs into the lungs every 6 (six) hours as needed for wheezing or shortness of breath. Reported on 11/10/2015   budesonide-formoterol (SYMBICORT) 80-4.5 MCG/ACT inhaler Inhale 2 puffs into the lungs 2 (two) times daily.   No facility-administered encounter medications on file as of 02/13/2021.    Surgical History: Past Surgical History:  Procedure Laterality Date   ABDOMINAL HYSTERECTOMY     BREAST SURGERY     BIOPSY   CATARACT EXTRACTION W/PHACO Left 10/04/2015   Procedure: CATARACT EXTRACTION PHACO AND INTRAOCULAR LENS PLACEMENT (IOC);  Surgeon: Birder Robson, MD;  Location: [facility] ORS;  Service: Ophthalmology;  Laterality: Left;  [place]: 00:46.8 AP%: 21.7 CDE: 10.19 Lot # [id number]   CATARACT EXTRACTION W/PHACO Right 10/25/2015   Procedure: CATARACT EXTRACTION PHACO AND INTRAOCULAR LENS PLACEMENT ([place]);  Surgeon: Birder Robson, MD;  Location: [facility] ORS;  Service: Ophthalmology;  Laterality: Right;  [place] 00:48 AP% 25.2 CDE 12.16 fluid pack lot # [id number]   COLONOSCOPY WITH PROPOFOL N/A 05/28/2016   Procedure: COLONOSCOPY WITH PROPOFOL;  Surgeon: Manya Silvas, MD;  Location: [facility] ENDOSCOPY;  Service: Endoscopy;  Laterality: N/A;   CYSTOSCOPY WITH BIOPSY N/A 08/31/2019   Procedure: CYSTOSCOPY WITH bladder BIOPSY;  Surgeon: Hollice Espy, MD;  Location: [facility]  ORS;  Service: Urology;  Laterality: N/A;   CYSTOSCOPY WITH FULGERATION N/A 08/31/2019   Procedure: CYSTOSCOPY WITH FULGERATION;  Surgeon: Hollice Espy, MD;  Location: [facility] ORS;  Service: Urology;  Laterality: N/A;    Medical History: Past Medical History:   Diagnosis Date   Arthritis    Asthma    Cancer ([place]) 2003   UTERINE, total Hysterectomy   COPD (chronic obstructive pulmonary disease) (HCC)    Depression    Hyperlipidemia    Hypertension    Shortness of breath dyspnea    DOE    Family History: Family History  Problem Relation Age of Onset   Kidney disease Mother    Kidney cancer Neg Hx    Prostate cancer Neg Hx     Social History: Social History   Socioeconomic History   Marital status: Single    Spouse name: Not on file   Number of children: Not on file   Years of education: Not on file   Highest education level: Not on file  Occupational History   Not on file  Tobacco Use   Smoking status: Every Day    Years: 25.00    Pack years: 0.00    Types: Cigarettes   Smokeless tobacco: Never   Tobacco comments:    3 or 4 cigarettes a day-reported on 11/14/20  Vaping Use   Vaping Use: Never used  Substance and Sexual Activity   Alcohol use: No    Alcohol/week: 0.0 standard drinks   Drug use: No   Sexual activity: Not on file  Other Topics Concern   Not on file  Social History Narrative   Not on file   Social Determinants of Health   Financial Resource Strain: Not on file  Food Insecurity: Not on file  Transportation Needs: Not on file  Physical Activity: Not on file  Stress: Not on file  Social Connections: Not on file  Intimate Partner Violence: Not on file    Vital Signs: Blood pressure 126/66, pulse 85, temperature 98.3 F (36.8 C), resp. rate 16, height 5\' 4"  (1.626 m), weight 160 lb (72.6 kg), SpO2 94 %.  Examination: General Appearance: The patient is well-developed, well-nourished, and in no distress. Skin: Gross inspection of skin unremarkable. Head: normocephalic, no gross deformities. Eyes: no gross deformities noted. ENT: ears appear grossly normal no exudates. Neck: Supple. No thyromegaly. No LAD. Respiratory: Lungs clear to auscultation bilaterally. Cardiovascular: Normal S1 and S2  without murmur or rub. Extremities: No cyanosis. pulses are equal. Neurologic: Alert and oriented. No involuntary movements.  LABS: Recent Results (from the past 2160 hour(s))  Pulmonary function test     Status: None   Collection Time: 12/14/20 12:00 PM  Result Value Ref Range   FEV1     FVC     FEV1/FVC     TLC     DLCO    POCT HgB A1C     Status: None   Collection Time: 01/18/21  5:10 PM  Result Value Ref Range   Hemoglobin A1C 5.4 4.0 - 5.6 %   HbA1c POC (<> result, manual entry)     HbA1c, POC (prediabetic range)     HbA1c, POC (controlled diabetic range)    TSH + free T4     Status: None   Collection Time: 01/24/21  9:09 AM  Result Value Ref Range   TSH 1.620 0.450 - 4.500 uIU/mL   Free T4 1.27 0.82 - 1.77 ng/dL  Lipid Profile  Status: Abnormal   Collection Time: 01/24/21  9:09 AM  Result Value Ref Range   Cholesterol, Total 127 100 - 199 mg/dL   Triglycerides 201 (H) 0 - 149 mg/dL   HDL 25 (L) >39 mg/dL   VLDL Cholesterol Cal 34 5 - 40 mg/dL   LDL Chol Calc (NIH) 68 0 - 99 mg/dL   Chol/HDL Ratio 5.1 (H) 0.0 - 4.4 ratio    Comment:                                   T. Chol/HDL Ratio                                             Men  Women                               1/2 Avg.Risk  3.4    3.3                                   Avg.Risk  5.0    4.4                                2X Avg.Risk  9.6    7.1                                3X Avg.Risk 23.4   11.0     Radiology: No results found.  No results found.  No results found.    Assessment and Plan: Patient Active Problem List   Diagnosis Date Noted   Subclinical hypothyroidism 01/20/2021   Prediabetes 01/20/2021   Cutaneous candidiasis 06/04/2020   Need for shingles vaccine 06/04/2020   Hematuria 11/18/2019   Atherosclerosis of aorta ([place]) 11/18/2019   Encounter for general adult medical examination with abnormal findings 05/31/2019   Benign essential tremor 05/31/2019   Need for vaccination  against Streptococcus pneumoniae using pneumococcal conjugate vaccine 7 05/31/2019   Dysuria 05/31/2019   Encounter for hepatitis C screening test for low risk patient 05/31/2019   Screening for breast cancer 05/21/2018   Needs flu shot 05/21/2018   Obstructive chronic bronchitis without exacerbation ([place]) 12/03/2017   Nicotine dependence, cigarettes, uncomplicated 81/08/7508   Mild intermittent asthma without complication 25/85/2778   Vitamin D deficiency 10/17/2017   Mixed hyperlipidemia 10/08/2017   Essential hypertension 10/08/2017   Generalized anxiety disorder 10/08/2017   SUI (stress urinary incontinence, female) 11/12/2015   Microscopic hematuria 11/12/2015   Urge incontinence 11/12/2015    1. Obstructive chronic bronchitis without exacerbation ([place]) Stable, continue inhalers as indicated and as prescribed - budesonide-formoterol (SYMBICORT) 80-4.5 MCG/ACT inhaler; Inhale 2 puffs into the lungs 2 (two) times daily.  Dispense: 30.6 each; Refill: 3 - albuterol (VENTOLIN HFA) 108 (90 Base) MCG/ACT inhaler; Inhale 2 puffs into the lungs every 6 (six) hours as needed for wheezing or shortness of breath. Reported on 11/10/2015  Dispense: 18 g; Refill: 5  2. SOB (shortness of breath) Reviewed PFT showing FEV1 of 1.65 L, 75% indicating mild restrictive and obstructive lung  disease.  This is slightly improved from previous PFT in 2020, we will continue to monitor   General Counseling: I have discussed the findings of the evaluation and examination with Clariece.  I have also discussed any further diagnostic evaluation thatmay be needed or ordered today. Tomeshia verbalizes understanding of the findings of todays visit. We also reviewed her medications today and discussed drug interactions and side effects including but not limited excessive drowsiness and altered mental states. We also discussed that there is always a risk not just to her but also people around her. she has been encouraged to call  the office with any questions or concerns that should arise related to todays visit.  No orders of the defined types were placed in this encounter.    Time spent: 30  I have personally obtained a history, examined the patient, evaluated laboratory and imaging results, formulated the assessment and plan and placed orders. This patient was seen by Drema Dallas, PA-C in collaboration with Dr. Devona Konig as a part of collaborative care agreement.     Allyne Gee, MD [facility] Pulmonary and Critical Care Sleep medicine

## 2021-02-14 NOTE — Patient Instructions (Signed)

## 2021-02-28 ENCOUNTER — Ambulatory Visit: Payer: PPO | Admitting: Nurse Practitioner

## 2021-03-07 ENCOUNTER — Telehealth: Payer: Self-pay | Admitting: Internal Medicine

## 2021-03-07 NOTE — Chronic Care Management (AMB) (Signed)
  Chronic Care Management   Note  03/07/2021 Name: Wendy Garza MRN: [id number] DOB: 09-05-1945  Wendy Garza is a 75 y.o. year old female who is a primary care patient of Lavera Guise, MD. I reached out to [name] by phone today in response to a referral sent by Wendy Garza's PCP, Lavera Guise, MD.   Wendy Garza was given information about Chronic Care Management services today including:  CCM service includes personalized support from designated clinical staff supervised by her physician, including individualized plan of care and coordination with other care providers 24/7 contact phone numbers for assistance for urgent and routine care needs. Service will only be billed when office clinical staff spend 20 minutes or more in a month to coordinate care. Only one practitioner may furnish and bill the service in a calendar month. The patient may stop CCM services at any time (effective at the end of the month) by phone call to the office staff.   Patient agreed to services and verbal consent obtained.   Follow up plan:   Wendy Garza

## 2021-03-19 ENCOUNTER — Other Ambulatory Visit: Payer: Self-pay | Admitting: Nurse Practitioner

## 2021-03-23 ENCOUNTER — Other Ambulatory Visit: Payer: Self-pay

## 2021-03-23 ENCOUNTER — Encounter: Payer: Self-pay | Admitting: Nurse Practitioner

## 2021-03-23 ENCOUNTER — Ambulatory Visit (INDEPENDENT_AMBULATORY_CARE_PROVIDER_SITE_OTHER): Payer: PPO | Admitting: Physician Assistant

## 2021-03-23 DIAGNOSIS — I700 Atherosclerosis of aorta: Secondary | ICD-10-CM | POA: Diagnosis not present

## 2021-03-23 DIAGNOSIS — E782 Mixed hyperlipidemia: Secondary | ICD-10-CM

## 2021-03-23 DIAGNOSIS — I10 Essential (primary) hypertension: Secondary | ICD-10-CM | POA: Diagnosis not present

## 2021-03-23 DIAGNOSIS — F17210 Nicotine dependence, cigarettes, uncomplicated: Secondary | ICD-10-CM | POA: Diagnosis not present

## 2021-03-23 DIAGNOSIS — R0989 Other specified symptoms and signs involving the circulatory and respiratory systems: Secondary | ICD-10-CM | POA: Diagnosis not present

## 2021-03-23 DIAGNOSIS — J449 Chronic obstructive pulmonary disease, unspecified: Secondary | ICD-10-CM | POA: Diagnosis not present

## 2021-03-23 DIAGNOSIS — E038 Other specified hypothyroidism: Secondary | ICD-10-CM

## 2021-03-23 NOTE — Progress Notes (Signed)
[facility] [place], [place] [address]  Internal MEDICINE  Office Visit Note  Patient Name: Wendy Garza  [id number]  [id number]  Date of Service: 03/23/2021  Chief Complaint  Patient presents with   Follow-up    Med refill, review labs     HPI Pt is here for regular follow up and has no complaints today -Breathing has been stable, using symbicort daily and not using albuterol much -BP not checked at home but well controlled in office -Smoking 4-5 cigs per day and will continue to work on cutting down -Thyroid labs reviewed and stable--continue current low dose synthroid -Lipid panel abnormal but showed overall improvement with LDL of 68. Ratio is elevated at 5.1 and patient is a smoker, therefore discussed checking carotid US for further evaluation. Continue to take crestor '20mg'$  nightly.  Current Medication: Outpatient Encounter Medications as of 03/23/2021  Medication Sig   albuterol (VENTOLIN HFA) 108 (90 Base) MCG/ACT inhaler Inhale 2 puffs into the lungs every 6 (six) hours as needed for wheezing or shortness of breath. Reported on 11/10/2015   budesonide-formoterol (SYMBICORT) 80-4.5 MCG/ACT inhaler Inhale 2 puffs into the lungs 2 (two) times daily.   ibuprofen (ADVIL) 200 MG tablet Take 600 mg by mouth every 8 (eight) hours as needed (for pain.).   levothyroxine (SYNTHROID) 50 MCG tablet TAKE 1 TABLET BY MOUTH DAILY BEFORE BREAKFAST   metoprolol tartrate (LOPRESSOR) 50 MG tablet TAKE 1/2 OF A TABLET BY MOUTH TWICE A DAY   rosuvastatin (CRESTOR) 20 MG tablet TAKE 1 TABLET BY MOUTH DAILY FOR HIGH CHOLESTEROL   sertraline (ZOLOFT) 50 MG tablet Take 1 tablet (50 mg total) by mouth daily.   No facility-administered encounter medications on file as of 03/23/2021.    Surgical History: Past Surgical History:  Procedure Laterality Date   ABDOMINAL HYSTERECTOMY     BREAST SURGERY     BIOPSY   CATARACT EXTRACTION W/PHACO Left 10/04/2015   Procedure:  CATARACT EXTRACTION PHACO AND INTRAOCULAR LENS PLACEMENT (IOC);  Surgeon: Birder Robson, MD;  Location: [facility] ORS;  Service: Ophthalmology;  Laterality: Left;  [place]: 00:46.8 AP%: 21.7 CDE: 10.19 Lot # [id number]   CATARACT EXTRACTION W/PHACO Right 10/25/2015   Procedure: CATARACT EXTRACTION PHACO AND INTRAOCULAR LENS PLACEMENT ([place]);  Surgeon: Birder Robson, MD;  Location: [facility] ORS;  Service: Ophthalmology;  Laterality: Right;  [place] 00:48 AP% 25.2 CDE 12.16 fluid pack lot # [id number]   COLONOSCOPY WITH PROPOFOL N/A 05/28/2016   Procedure: COLONOSCOPY WITH PROPOFOL;  Surgeon: Manya Silvas, MD;  Location: [facility] ENDOSCOPY;  Service: Endoscopy;  Laterality: N/A;   CYSTOSCOPY WITH BIOPSY N/A 08/31/2019   Procedure: CYSTOSCOPY WITH bladder BIOPSY;  Surgeon: Hollice Espy, MD;  Location: [facility] ORS;  Service: Urology;  Laterality: N/A;   CYSTOSCOPY WITH FULGERATION N/A 08/31/2019   Procedure: CYSTOSCOPY WITH FULGERATION;  Surgeon: Hollice Espy, MD;  Location: [facility] ORS;  Service: Urology;  Laterality: N/A;    Medical History: Past Medical History:  Diagnosis Date   Arthritis    Asthma    Cancer ([place]) 2003   UTERINE, total Hysterectomy   COPD (chronic obstructive pulmonary disease) (HCC)    Depression    Hyperlipidemia    Hypertension    Shortness of breath dyspnea    DOE    Family History: Family History  Problem Relation Age of Onset   Kidney disease Mother    Kidney cancer Neg Hx    Prostate cancer Neg Hx     Social  History   Socioeconomic History   Marital status: Single    Spouse name: Not on file   Number of children: Not on file   Years of education: Not on file   Highest education level: Not on file  Occupational History   Not on file  Tobacco Use   Smoking status: Every Day    Years: 25.00    Types: Cigarettes   Smokeless tobacco: Never   Tobacco comments:    3 or 4 cigarettes a day-reported on 11/14/20  Vaping Use   Vaping Use: Never used  Substance and Sexual  Activity   Alcohol use: No    Alcohol/week: 0.0 standard drinks   Drug use: No   Sexual activity: Not on file  Other Topics Concern   Not on file  Social History Narrative   Not on file   Social Determinants of Health   Financial Resource Strain: Not on file  Food Insecurity: Not on file  Transportation Needs: Not on file  Physical Activity: Not on file  Stress: Not on file  Social Connections: Not on file  Intimate Partner Violence: Not on file      Review of Systems  Constitutional:  Negative for chills, fatigue and unexpected weight change.  HENT:  Negative for congestion, postnasal drip, rhinorrhea, sneezing and sore throat.   Eyes:  Negative for redness.  Respiratory:  Positive for cough. Negative for chest tightness, shortness of breath and wheezing.   Cardiovascular:  Negative for chest pain and palpitations.  Gastrointestinal:  Negative for abdominal pain, constipation, diarrhea, nausea and vomiting.  Genitourinary:  Negative for dysuria and frequency.  Musculoskeletal:  Negative for arthralgias, back pain, joint swelling and neck pain.  Skin:  Negative for rash.  Neurological: Negative.  Negative for tremors and numbness.  Hematological:  Negative for adenopathy. Does not bruise/bleed easily.  Psychiatric/Behavioral:  Negative for behavioral problems (Depression), sleep disturbance and suicidal ideas. The patient is not nervous/anxious.    Vital Signs: BP 122/72   Pulse 77   Temp 98.4 F (36.9 C)   Resp 16   Ht '5\' 4"'$  (1.626 m)   Wt 158 lb 3.2 oz (71.8 kg)   LMP  (LMP Unknown)   SpO2 96%   BMI 27.15 kg/m    Physical Exam Vitals and nursing note reviewed.  Constitutional:      General: She is not in acute distress.    Appearance: She is well-developed. She is not diaphoretic.  HENT:     Head: Normocephalic and atraumatic.     Mouth/Throat:     Pharynx: No oropharyngeal exudate.  Eyes:     Pupils: Pupils are equal, round, and reactive to light.   Neck:     Thyroid: No thyromegaly.     Vascular: No JVD.     Trachea: No tracheal deviation.  Cardiovascular:     Rate and Rhythm: Normal rate and regular rhythm.     Heart sounds: Normal heart sounds. No murmur heard.   No friction rub. No gallop.  Pulmonary:     Effort: Pulmonary effort is normal. No respiratory distress.     Breath sounds: No wheezing or rales.  Chest:     Chest wall: No tenderness.  Abdominal:     General: Bowel sounds are normal.     Palpations: Abdomen is soft.  Musculoskeletal:        General: Normal range of motion.     Cervical back: Normal range of motion and neck supple.  Lymphadenopathy:     Cervical: No cervical adenopathy.  Skin:    General: Skin is warm and dry.  Neurological:     Mental Status: She is alert and oriented to person, place, and time.     Cranial Nerves: No cranial nerve deficit.  Psychiatric:        Behavior: Behavior normal.        Thought Content: Thought content normal.        Judgment: Judgment normal.       Assessment/Plan: 1. Essential hypertension Stable, continue current medication  2. Atherosclerosis of aorta ([place]) Continue crestor  3. Mixed hyperlipidemia Labs updated and have improved, but ratio elevated at 5.1. Continue crestor  4. Bilateral carotid bruits Due to elevated lipids/risk and smoking hx will go ahead and check carotid US and treat accordingly. Continue crestor for now - US Carotid Duplex Bilateral; Future  5. Subclinical hypothyroidism Stable on recent labs, continue low dose synthroid  6. Nicotine dependence, cigarettes, uncomplicated Will continue to work on cutting down further and stopping all together  7. Obstructive chronic bronchitis without exacerbation ([place]) Stable, continue inhalers as indicated   General Counseling: tanveer bergstrand understanding of the findings of todays visit and agrees with plan of treatment. I have discussed any further diagnostic evaluation that may be  needed or ordered today. We also reviewed her medications today. she has been encouraged to call the office with any questions or concerns that should arise related to todays visit.    Orders Placed This Encounter  Procedures   US Carotid Duplex Bilateral    No orders of the defined types were placed in this encounter.   This patient was seen by Drema Dallas, PA-C in collaboration with Dr. Clayborn Bigness as a part of collaborative care agreement.   Total time spent:35 Minutes Time spent includes review of chart, medications, test results, and follow up plan with the patient.      Dr Lavera Guise Internal medicine

## 2021-03-30 ENCOUNTER — Other Ambulatory Visit: Payer: Self-pay

## 2021-03-30 MED ORDER — METOPROLOL TARTRATE 50 MG PO TABS: ORAL_TABLET | ORAL | 5 refills | Status: DC

## 2021-04-02 ENCOUNTER — Other Ambulatory Visit: Payer: Self-pay | Admitting: Internal Medicine

## 2021-04-02 DIAGNOSIS — E038 Other specified hypothyroidism: Secondary | ICD-10-CM

## 2021-05-03 ENCOUNTER — Other Ambulatory Visit: Payer: Self-pay

## 2021-05-03 ENCOUNTER — Ambulatory Visit: Payer: PPO

## 2021-05-03 DIAGNOSIS — R0989 Other specified symptoms and signs involving the circulatory and respiratory systems: Secondary | ICD-10-CM

## 2021-05-04 ENCOUNTER — Other Ambulatory Visit: Payer: Self-pay | Admitting: Internal Medicine

## 2021-05-04 DIAGNOSIS — F411 Generalized anxiety disorder: Secondary | ICD-10-CM

## 2021-05-09 ENCOUNTER — Telehealth: Payer: Self-pay | Admitting: Pharmacist

## 2021-05-09 NOTE — Progress Notes (Signed)
Chronic Care Management Pharmacy Note  05/10/2021 Name:  Wendy Garza MRN:  [id number] DOB:  1946/02/01  Summary: Initial PharmD visit.  Patient complains of some knee pain.  Interested in Meloxicam - she will discuss at upcoming physical.  All other meds reviewed/updated  Recommendations/Changes made from today's visit: None at this time  Plan: 6 month fu   Subjective: Wendy Garza is an 75 y.o. year old female who is a primary patient of Humphrey Rolls, Timoteo Gaul, MD.  The CCM team was consulted for assistance with disease management and care coordination needs.    Engaged with patient face to face for initial visit in response to provider referral for pharmacy case management and/or care coordination services.   Consent to Services:  The patient was given the following information about Chronic Care Management services today, agreed to services, and gave verbal consent: 1. CCM service includes personalized support from designated clinical staff supervised by the primary care provider, including individualized plan of care and coordination with other care providers 2. 24/7 contact phone numbers for assistance for urgent and routine care needs. 3. Service will only be billed when office clinical staff spend 20 minutes or more in a month to coordinate care. 4. Only one practitioner may furnish and bill the service in a calendar month. 5.The patient may stop CCM services at any time (effective at the end of the month) by phone call to the office staff. 6. The patient will be responsible for cost sharing (co-pay) of up to 20% of the service fee (after annual deductible is met). Patient agreed to services and consent obtained.  Patient Care Team: Lavera Guise, MD as PCP - General (Internal Medicine) Edythe Clarity, [facility] as Pharmacist (Pharmacist)  Recent office visits:  03/23/21 Mylinda Latina, PA-C. For follow-up. No medication changes.  02/13/21 McDonough, Si Gaul, PA-C. For follow-up.  No medication changes.  01/18/21 [name], NP. For follow-up. No medication changes  11/17/20 Luiz Ochoa, NP. For follow-up. No medication changes.  11/14/20 Dr. Humphrey Rolls For SOB. No medication changes.    Recent consult visits:  12/21/20 Ophthalmology George Ina, Gwyndolyn Saxon For cataracts.    Hospital visits:  None in previous 6 months   Medication History: Rosuvastatin 20 mg 05/05/21 90 DS.      Objective:  Lab Results  Component Value Date   CREATININE 0.94 06/01/2020   BUN 12 06/01/2020   GFRNONAA 61 06/01/2020   GFRAA 71 06/01/2020   NA 141 06/01/2020   K 4.5 06/01/2020   CALCIUM 9.4 06/01/2020   CO2 24 06/01/2020   GLUCOSE 119 (H) 06/01/2020    Lab Results  Component Value Date/Time   HGBA1C 5.4 01/18/2021 05:10 PM   HGBA1C 5.8 (H) 09/29/2020 10:21 AM    Last diabetic Eye exam: No results found for: HMDIABEYEEXA  Last diabetic Foot exam: No results found for: HMDIABFOOTEX   Lab Results  Component Value Date   CHOL 127 01/24/2021   HDL 25 (L) 01/24/2021   LDLCALC 68 01/24/2021   TRIG 201 (H) 01/24/2021   CHOLHDL 5.1 (H) 01/24/2021    Hepatic Function Latest Ref Rng & Units 06/01/2020 05/21/2019 05/22/2018  Total Protein 6.0 - 8.5 g/dL 6.6 6.8 6.5  Albumin 3.7 - 4.7 g/dL 4.3 4.2 4.2  AST 0 - 40 IU/L '18 21 19  ' ALT 0 - 32 IU/L '18 16 14  ' Alk Phosphatase 44 - 121 IU/L 68 73 62  Total Bilirubin 0.0 - 1.2 mg/dL 0.5  0.4 0.5    Lab Results  Component Value Date/Time   TSH 1.620 01/24/2021 09:09 AM   TSH 1.080 11/14/2020 09:36 AM   FREET4 1.27 01/24/2021 09:09 AM   FREET4 1.37 11/14/2020 09:36 AM    CBC Latest Ref Rng & Units 06/01/2020 08/27/2019 05/21/2019  WBC 3.4 - 10.8 x10E3/uL 8.6 9.3 8.4  Hemoglobin 11.1 - 15.9 g/dL 13.8 14.0 14.0  Hematocrit 34.0 - 46.6 % 40.8 42.8 41.6  Platelets 150 - 450 x10E3/uL 304 291 299    Lab Results  Component Value Date/Time   VD25OH 38.5 06/01/2020 08:54 AM   VD25OH 13.9 (L) 05/21/2019 09:12 AM    Clinical ASCVD:  No  The ASCVD Risk score (Arnett DK, et al., 2019) failed to calculate for the following reasons:   The valid total cholesterol range is 130 to 320 mg/dL    Depression screen [facility] 2/9 02/13/2021 01/18/2021 09/23/2020  Decreased Interest 0 0 0  Down, Depressed, Hopeless 0 0 0  PHQ - 2 Score 0 0 0     Social History   Tobacco Use  Smoking Status Every Day   Years: 25.00   Types: Cigarettes  Smokeless Tobacco Never  Tobacco Comments   3 or 4 cigarettes a day-reported on 11/14/20   BP Readings from Last 3 Encounters:  03/23/21 122/72  02/13/21 126/66  01/18/21 128/72   Pulse Readings from Last 3 Encounters:  03/23/21 77  02/13/21 85  01/18/21 74   Wt Readings from Last 3 Encounters:  03/23/21 158 lb 3.2 oz (71.8 kg)  02/13/21 160 lb (72.6 kg)  01/18/21 160 lb (72.6 kg)   BMI Readings from Last 3 Encounters:  03/23/21 27.15 kg/m  02/13/21 27.46 kg/m  01/18/21 27.46 kg/m    Assessment/Interventions: Review of patient past medical history, allergies, medications, health status, including review of consultants reports, laboratory and other test data, was performed as part of comprehensive evaluation and provision of chronic care management services.   SDOH:  (Social Determinants of Health) assessments and interventions performed: Yes  Financial Resource Strain: Low Risk    Difficulty of Paying Living Expenses: Not very hard    SDOH Screenings   Alcohol Screen: Low Risk    Last Alcohol Screening Score (AUDIT): 0  Depression (PHQ2-9): Low Risk    PHQ-2 Score: 0  Financial Resource Strain: Low Risk    Difficulty of Paying Living Expenses: Not very hard  Food Insecurity: Not on file  Housing: Not on file  Physical Activity: Not on file  Social Connections: Not on file  Stress: Not on file  Tobacco Use: High Risk   Smoking Tobacco Use: Every Day   Smokeless Tobacco Use: Never  Transportation Needs: Not on file    [place]  Allergies  Allergen Reactions    Aspirin Other (See Comments)    Patient cannot recall what happened   Otho Darner Allergy] Hives   Latex Rash   Nickel Rash    Medications Reviewed Today     Reviewed by Edythe Clarity, [facility] (Pharmacist) on 05/10/21 at [id number]  Med List Status: <None>   Medication Order Taking? Sig Documenting Provider Last Dose Status Informant  albuterol (VENTOLIN HFA) 108 (90 Base) MCG/ACT inhaler [id number] Yes Inhale 2 puffs into the lungs every 6 (six) hours as needed for wheezing or shortness of breath. Reported on 11/10/2015 Mylinda Latina, PA-C Taking Active   budesonide-formoterol [facility]) 80-4.5 MCG/ACT inhaler [id number] Yes Inhale 2 puffs into the lungs 2 (  two) times daily. McDonough, Si Gaul, PA-C Taking Active   ibuprofen (ADVIL) 200 MG tablet [id number] Yes Take 600 mg by mouth every 8 (eight) hours as needed (for pain.). [provider] Taking Active Self  levothyroxine (SYNTHROID) 50 MCG tablet [id number] Yes TAKE 1 TABLET BY MOUTH EVERY DAY BEFORE BREAKFAST Lavera Guise, MD Taking Active   metoprolol tartrate (LOPRESSOR) 50 MG tablet [id number] Yes TAKE 1/2 OF A TABLET BY MOUTH TWICE A DAY Lavera Guise, MD Taking Active   rosuvastatin (CRESTOR) 20 MG tablet [id number] Yes TAKE 1 TABLET BY MOUTH DAILY FOR HIGH CHOLESTEROL Lavera Guise, MD Taking Active   sertraline (ZOLOFT) 50 MG tablet [id number] Yes TAKE 1 TABLET BY MOUTH EVERY DAY Lavera Guise, MD Taking Active             Patient Active Problem List   Diagnosis Date Noted   Subclinical hypothyroidism 01/20/2021   Prediabetes 01/20/2021   Cutaneous candidiasis 06/04/2020   Need for shingles vaccine 06/04/2020   Hematuria 11/18/2019   Atherosclerosis of aorta ([place]) 11/18/2019   Encounter for general adult medical examination with abnormal findings 05/31/2019   Benign essential tremor 05/31/2019   Need for vaccination against Streptococcus pneumoniae using pneumococcal conjugate vaccine 7 05/31/2019   Dysuria  05/31/2019   Encounter for hepatitis C screening test for low risk patient 05/31/2019   Screening for breast cancer 05/21/2018   Needs flu shot 05/21/2018   Obstructive chronic bronchitis without exacerbation ([place]) 12/03/2017   Nicotine dependence, cigarettes, uncomplicated 22/29/7989   Mild intermittent asthma without complication 21/19/4174   Vitamin D deficiency 10/17/2017   Mixed hyperlipidemia 10/08/2017   Essential hypertension 10/08/2017   Generalized anxiety disorder 10/08/2017   SUI (stress urinary incontinence, female) 11/12/2015   Microscopic hematuria 11/12/2015   Urge incontinence 11/12/2015    Immunization History  Administered Date(s) Administered   Fluad Quad(high Dose 65+) 05/21/2019   Influenza Inj Mdck Quad Pf 05/13/2018, 05/19/2020   Influenza, High Dose Seasonal PF 05/14/2017   Influenza-Unspecified 05/21/2019   PFIZER(Purple Top)SARS-COV-2 Vaccination 10/09/2019, 10/30/2019, 06/16/2020   Pneumococcal Conjugate-13 05/18/2020   Pneumococcal Polysaccharide-23 07/13/2014   Zoster, Live 04/30/2010    Conditions to be addressed/monitored:  HTN, Asthma, HLD, GAD, Pre-DM, Hypothyroidism  Care Plan : General Pharmacy (Adult)  Updates made by Edythe Clarity, RPH since 05/10/2021 12:00 AM     Problem: HTN, Asthma, HLD, GAD, Pre-DM, Hypothyroidism   Priority: High  Onset Date: 05/10/2021     Long-Range Goal: Patient-Specific Goal   Start Date: 05/10/2021  Expected End Date: 11/07/2021  This Visit's Progress: On track  Priority: High  Note:   Current Barriers:  Unable to independently monitor therapeutic efficacy  Pharmacist Clinical Goal(s):  Patient will maintain control of BP and cholesterol as evidenced by labs  through collaboration with PharmD and provider.   Interventions: 1:1 collaboration with Lavera Guise, MD regarding development and update of comprehensive plan of care as evidenced by provider attestation and co-signature Inter-disciplinary  care team collaboration (see longitudinal plan of care) Comprehensive medication review performed; medication list updated in electronic medical record  Hypertension (BP goal <140/90) -Controlled -Current treatment: Metoprolol tartrate 34m daily -Medications previously tried: none noted  -Current home readings: not checking -Current exercise habits: minimal, works outside has a garden that she takes care of -Denies hypotensive/hypertensive symptoms -Educated on BP goals and benefits of medications for prevention of heart attack, stroke and kidney damage; Exercise goal of 150 minutes per week; Importance  of home blood pressure monitoring; Symptoms of hypotension and importance of maintaining adequate hydration; -Counseled to monitor BP at home periodically, document, and provide log at future appointments -Recommended to continue current medication  Hyperlipidemia: (LDL goal < 100) -Controlled -Current treatment: Rosuvastatin 76m daily -Medications previously tried: none noted  -Last LDL well controlled -Educated on Cholesterol goals;  Benefits of statin for ASCVD risk reduction; Importance of limiting foods high in cholesterol; Denies any side effects from medication - reports adherence -Recommended to continue current medication  Anxiety (Goal: Minimize symptoms) -Controlled -Current treatment: Sertraline 539mdaily -Medications previously tried/failed: none noted -PHQ9:  PHQ9 SCORE ONLY 02/13/2021 01/18/2021 09/23/2020  PHQ-9 Total Score 0 0 0  -Reports mood well controlled, sleep is off and on and right now she is having some difficulty due to racing mind. -Educated on Benefits of medication for symptom control -Recommended to continue current medication  Asthma (Goal: Control symptoms) -Controlled -Current treatment  Albuterol Hfa 908mprn - rarely uses Symbicort 80-4.5 mcg/act bid -Medications previously tried: none noted -Denies any SOB, using inhaler  properly -Assessed finances, copays are not a concern at this time  -Recommended to continue current medication  Nicotine Abuse (Goal: Smoking Cessation) -Not ideally controlled -Current treatment  None noted -Medications previously tried: none noted -Patient unsure if she is ready to quit or not -Educated on various ways we could try and help, NRT or Chantix -Patient prefers patches and is considering trying those  -Recommended to contact us [place] she needs support, will follow up and continue to encourage  Hypothyroidism (Goal: Maintain TSH) -Controlled -Current treatment  Levothyroxine 78m81maily -Medications previously tried: none noted  -TSH is WNL -Recommended to continue current medication  Patient Goals/Self-Care Activities Patient will:  - take medications as prescribed focus on medication adherence by pill counts check blood pressure periodically, document, and provide at future appointments  Follow Up Plan: The care management team will reach out to the patient again over the next 180 days.         Medication Assistance: None required.  Patient affirms current coverage meets needs.  Compliance/Adherence/Medication fill history: Care Gaps: None identified  Star-Rating Drugs: Rosuvastatin 20 mg 05/05/21 90 DS.  Patient's preferred pharmacy is:  [id number] [place] - [id number]: [phone] pill box? No - organizes meds each night Pt endorses 100% compliance  We discussed: Benefits of medication synchronization, packaging and delivery as well as enhanced pharmacist oversight with [employer]. Patient decided to: Continue current medication management strategy  Care Plan and Follow Up Patient Decision:  Patient agrees to Care Plan and Follow-up.  Plan: The care management team will reach out to the patient again over the next 180 days.  ChrisBeverly MilchrmD Clinical  Pharmacist [phone]

## 2021-05-09 NOTE — Progress Notes (Addendum)
    Chronic Care Management Pharmacy Assistant   Name: Wendy Garza  MRN: [id number] DOB: Jul 30, 1946  Wendy Garza is an 75 y.o. year old female who presents for his initial CCM visit with the clinical pharmacist.  Reason for Encounter: Chart Prep    Conditions to be addressed/monitored: HTN, HLD, Vitamin D, Anxiety, COPD.   Primary concerns for visit include: HTN.  Recent office visits:  03/23/21 Mylinda Latina, PA-C. For follow-up. No medication changes.  02/13/21 McDonough, Si Gaul, PA-C. For follow-up. No medication changes.  01/18/21 [name], NP. For follow-up. No medication changes  11/17/20 Luiz Ochoa, NP. For follow-up. No medication changes.  11/14/20 Dr. Humphrey Rolls For SOB. No medication changes.   Recent consult visits:  12/21/20 Ophthalmology George Ina, Gwyndolyn Saxon For cataracts.   Hospital visits:  None in previous 6 months  Medication History: Rosuvastatin 20 mg 05/05/21 90 DS.   Medications: Outpatient Encounter Medications as of 05/09/2021  Medication Sig   albuterol (VENTOLIN HFA) 108 (90 Base) MCG/ACT inhaler Inhale 2 puffs into the lungs every 6 (six) hours as needed for wheezing or shortness of breath. Reported on 11/10/2015   budesonide-formoterol (SYMBICORT) 80-4.5 MCG/ACT inhaler Inhale 2 puffs into the lungs 2 (two) times daily.   ibuprofen (ADVIL) 200 MG tablet Take 600 mg by mouth every 8 (eight) hours as needed (for pain.).   levothyroxine (SYNTHROID) 50 MCG tablet TAKE 1 TABLET BY MOUTH EVERY DAY BEFORE BREAKFAST   metoprolol tartrate (LOPRESSOR) 50 MG tablet TAKE 1/2 OF A TABLET BY MOUTH TWICE A DAY   rosuvastatin (CRESTOR) 20 MG tablet TAKE 1 TABLET BY MOUTH DAILY FOR HIGH CHOLESTEROL   sertraline (ZOLOFT) 50 MG tablet TAKE 1 TABLET BY MOUTH EVERY DAY   No facility-administered encounter medications on file as of 05/09/2021.    Have you seen any other providers since your last visit? Patient stated no.   Any changes in your medications  or health? Patient stated no.  Any side effects from any medications? Patient stated no.   Do you have an symptoms or problems not managed by your medications? Patient stated no.   Any concerns about your health right now? Patient stated no.   Has your provider asked that you check blood pressure, blood sugar, or follow special diet at home? Patient stated no.   Do you get any type of exercise on a regular basis? Patient stated no.   Can you think of a goal you would like to reach for your health? Patient stated she would like to quit smoking.   Do you have any problems getting your medications? Patient stated no.   Is there anything that you would like to discuss during the appointment? Patient stated no.   Please bring medications and supplements to appointment, patient reminded of her face to face appointment on 05/10/21 at 9 am.  [place], Desha Pharmacist Assistant [phone]

## 2021-05-10 ENCOUNTER — Other Ambulatory Visit: Payer: Self-pay

## 2021-05-10 ENCOUNTER — Ambulatory Visit: Payer: PPO | Admitting: Pharmacist

## 2021-05-10 DIAGNOSIS — E782 Mixed hyperlipidemia: Secondary | ICD-10-CM

## 2021-05-10 DIAGNOSIS — F17210 Nicotine dependence, cigarettes, uncomplicated: Secondary | ICD-10-CM

## 2021-05-10 DIAGNOSIS — I10 Essential (primary) hypertension: Secondary | ICD-10-CM

## 2021-05-10 NOTE — Patient Instructions (Addendum)
Visit Information   Goals Addressed             This Visit's Progress    Track and Manage My Blood Pressure-Hypertension       Timeframe:  Long-Range Goal Priority:  High Start Date:     05/10/21                        Expected End Date:  11/07/21                     Follow Up Date 08/09/21    - check blood pressure weekly - choose a place to take my blood pressure (home, clinic or office, retail store) - write blood pressure results in a log or diary    Why is this important?   You won't feel high blood pressure, but it can still hurt your blood vessels.  High blood pressure can cause heart or kidney problems. It can also cause a stroke.  Making lifestyle changes like losing a little weight or eating less salt will help.  Checking your blood pressure at home and at different times of the day can help to control blood pressure.  If the doctor prescribes medicine remember to take it the way the doctor ordered.  Call the office if you cannot afford the medicine or if there are questions about it.     Notes:        Patient Care Plan: General Pharmacy (Adult)     Problem Identified: HTN, Asthma, HLD, GAD, Pre-DM, Hypothyroidism   Priority: High  Onset Date: 05/10/2021     Long-Range Goal: Patient-Specific Goal   Start Date: 05/10/2021  Expected End Date: 11/07/2021  This Visit's Progress: On track  Priority: High  Note:   Current Barriers:  Unable to independently monitor therapeutic efficacy  Pharmacist Clinical Goal(s):  Patient will maintain control of BP and cholesterol as evidenced by labs  through collaboration with PharmD and provider.   Interventions: 1:1 collaboration with Lavera Guise, MD regarding development and update of comprehensive plan of care as evidenced by provider attestation and co-signature Inter-disciplinary care team collaboration (see longitudinal plan of care) Comprehensive medication review performed; medication list updated in electronic  medical record  Hypertension (BP goal <140/90) -Controlled -Current treatment: Metoprolol tartrate '50mg'$  daily -Medications previously tried: none noted  -Current home readings: not checking -Current exercise habits: minimal, works outside has a garden that she takes care of -Denies hypotensive/hypertensive symptoms -Educated on BP goals and benefits of medications for prevention of heart attack, stroke and kidney damage; Exercise goal of 150 minutes per week; Importance of home blood pressure monitoring; Symptoms of hypotension and importance of maintaining adequate hydration; -Counseled to monitor BP at home periodically, document, and provide log at future appointments -Recommended to continue current medication  Hyperlipidemia: (LDL goal < 100) -Controlled -Current treatment: Rosuvastatin '20mg'$  daily -Medications previously tried: none noted  -Last LDL well controlled -Educated on Cholesterol goals;  Benefits of statin for ASCVD risk reduction; Importance of limiting foods high in cholesterol; Denies any side effects from medication - reports adherence -Recommended to continue current medication  Anxiety (Goal: Minimize symptoms) -Controlled -Current treatment: Sertraline '50mg'$  daily -Medications previously tried/failed: none noted -PHQ9:  PHQ9 SCORE ONLY 02/13/2021 01/18/2021 09/23/2020  PHQ-9 Total Score 0 0 0  -Reports mood well controlled, sleep is off and on and right now she is having some difficulty due to racing mind. -Educated on Benefits of  medication for symptom control -Recommended to continue current medication  Asthma (Goal: Control symptoms) -Controlled -Current treatment  Albuterol Hfa 53mg prn - rarely uses Symbicort 80-4.5 mcg/act bid -Medications previously tried: none noted -Denies any SOB, using inhaler properly -Assessed finances, copays are not a concern at this time  -Recommended to continue current medication  Nicotine Abuse (Goal: Smoking  Cessation) -Not ideally controlled -Current treatment  None noted -Medications previously tried: none noted -Patient unsure if she is ready to quit or not -Educated on various ways we could try and help, NRT or Chantix -Patient prefers patches and is considering trying those  -Recommended to contact [place] she needs support, will follow up and continue to encourage  Hypothyroidism (Goal: Maintain TSH) -Controlled -Current treatment  Levothyroxine 577m daily -Medications previously tried: none noted  -TSH is WNL -Recommended to continue current medication  Patient Goals/Self-Care Activities Patient will:  - take medications as prescribed focus on medication adherence by pill counts check blood pressure periodically, document, and provide at future appointments  Follow Up Plan: The care management team will reach out to the patient again over the next 180 days.        Wendy Garza given information about Chronic Care Management services today including:  CCM service includes personalized support from designated clinical staff supervised by her physician, including individualized plan of care and coordination with other care providers 24/7 contact phone numbers for assistance for urgent and routine care needs. Standard insurance, coinsurance, copays and deductibles apply for chronic care management only during months in which we provide at least 20 minutes of these services. Most insurances cover these services at 100%, however patients may be responsible for any copay, coinsurance and/or deductible if applicable. This service may help you avoid the need for more expensive face-to-face services. Only one practitioner may furnish and bill the service in a calendar month. The patient may stop CCM services at any time (effective at the end of the month) by phone call to the office staff.  Patient agreed to services and verbal consent obtained.   The patient verbalized understanding  of instructions, educational materials, and care plan provided today and agreed to receive a mailed copy of patient instructions, educational materials, and care plan.  Telephone follow up appointment with pharmacy team member scheduled for: 6 months  ChEdythe ClarityRPSilo

## 2021-05-13 NOTE — Procedures (Signed)
[place], [place] [address]  DATE OF SERVICE: May 03, 2021  CAROTID DOPPLER INTERPRETATION:  Bilateral Carotid Ultrsasound and Color Doppler Examination was performed. The RIGHT CCA shows no significant plaque in the vessel. The LEFT CCA shows no significant plaque in the vessel. There was no intimal thickening noted in the RIGHT carotid artery. There was no significant intimal thickening in the LEFT carotid artery.  The RIGHT CCA shows peak systolic velocity of 99991111 cm per second. The end diastolic velocity is 24 cm per second on the RIGHT side. The RIGHT ICA shows peak systolic velocity of 123456 per second. RIGHT sided ICA end diastolic velocity is 34 cm per second. The RIGHT ECA shows a peak systolic velocity of 83 cm per second. The ICA/CCA ratio is calculated to be 1.03. This suggests less than 50% stenosis. The Vertebral Artery shows antegrade flow.  The LEFT CCA shows peak systolic velocity of 73 cm per second. The end diastolic velocity is 21 cm per second on the LEFT side. The LEFT ICA shows peak systolic velocity of [id number] per second. LEFT sided ICA end diastolic velocity is 30 cm per second. The LEFT ECA shows a peak systolic velocity of 85 cm per second. The ICA/CCA ratio is calculated to be 1.46. This suggests less than 50% stenosis. The Vertebral Artery shows antegrade flow.   Impression:    The RIGHT CAROTID shows less than 50% stenosis. The LEFT CAROTID shows less than 50% stenosis.  There is no significant plaque formation noted on the LEFT and no significant on the RIGHT  side. Consider a repeat Carotid doppler if clinical situation and symptoms warrant in 6-12 months. Patient should be encouraged to change lifestyles such as smoking cessation, regular exercise and dietary modification. Use of statins in the right clinical setting and ASA is encouraged.  Allyne Gee, MD [facility] Pulmonary Critical Care Medicine

## 2021-05-15 ENCOUNTER — Telehealth: Payer: Self-pay | Admitting: Pharmacist

## 2021-05-15 NOTE — Progress Notes (Signed)
    Chronic Care Management Pharmacy Assistant   Name: TEUTA JARRED  MRN: [id number] DOB: May 10, 1946  Reason for Encounter: CCM Care Plan  Medications: Outpatient Encounter Medications as of 05/15/2021  Medication Sig   albuterol (VENTOLIN HFA) 108 (90 Base) MCG/ACT inhaler Inhale 2 puffs into the lungs every 6 (six) hours as needed for wheezing or shortness of breath. Reported on 11/10/2015   budesonide-formoterol (SYMBICORT) 80-4.5 MCG/ACT inhaler Inhale 2 puffs into the lungs 2 (two) times daily.   ibuprofen (ADVIL) 200 MG tablet Take 600 mg by mouth every 8 (eight) hours as needed (for pain.).   levothyroxine (SYNTHROID) 50 MCG tablet TAKE 1 TABLET BY MOUTH EVERY DAY BEFORE BREAKFAST   metoprolol tartrate (LOPRESSOR) 50 MG tablet TAKE 1/2 OF A TABLET BY MOUTH TWICE A DAY   rosuvastatin (CRESTOR) 20 MG tablet TAKE 1 TABLET BY MOUTH DAILY FOR HIGH CHOLESTEROL   sertraline (ZOLOFT) 50 MG tablet TAKE 1 TABLET BY MOUTH EVERY DAY   No facility-administered encounter medications on file as of 05/15/2021.   Reviewed the patients initial visit reinsured it was completed per the pharmacist Leata Mouse request. Printed the CCM care plan. Mailed the patient CCM care plan to their most recent address on file.  Follow-Up:Pharmacist Review  Charlann Lange, [place] Pharmacist Assistant [phone]

## 2021-05-16 ENCOUNTER — Telehealth: Payer: Self-pay

## 2021-05-16 NOTE — Telephone Encounter (Signed)
Left vm to confirm 05/17/21 appointment-Toni

## 2021-05-17 ENCOUNTER — Other Ambulatory Visit: Payer: Self-pay

## 2021-05-17 ENCOUNTER — Encounter: Payer: Self-pay | Admitting: Nurse Practitioner

## 2021-05-17 ENCOUNTER — Ambulatory Visit (INDEPENDENT_AMBULATORY_CARE_PROVIDER_SITE_OTHER): Payer: PPO | Admitting: Nurse Practitioner

## 2021-05-17 VITALS — BP 150/90 | HR 78 | Temp 98.4°F | Resp 16 | Ht 64.0 in | Wt 159.4 lb

## 2021-05-17 DIAGNOSIS — R300 Dysuria: Secondary | ICD-10-CM

## 2021-05-17 DIAGNOSIS — Z23 Encounter for immunization: Secondary | ICD-10-CM | POA: Diagnosis not present

## 2021-05-17 DIAGNOSIS — R7303 Prediabetes: Secondary | ICD-10-CM | POA: Diagnosis not present

## 2021-05-17 DIAGNOSIS — E038 Other specified hypothyroidism: Secondary | ICD-10-CM

## 2021-05-17 DIAGNOSIS — R7301 Impaired fasting glucose: Secondary | ICD-10-CM

## 2021-05-17 DIAGNOSIS — I700 Atherosclerosis of aorta: Secondary | ICD-10-CM

## 2021-05-17 DIAGNOSIS — I10 Essential (primary) hypertension: Secondary | ICD-10-CM

## 2021-05-17 DIAGNOSIS — E559 Vitamin D deficiency, unspecified: Secondary | ICD-10-CM

## 2021-05-17 DIAGNOSIS — Z0001 Encounter for general adult medical examination with abnormal findings: Secondary | ICD-10-CM | POA: Diagnosis not present

## 2021-05-17 MED ORDER — AMLODIPINE BESYLATE 2.5 MG PO TABS: 2.5000 mg | ORAL_TABLET | Freq: Every day | ORAL | 1 refills | Status: DC

## 2021-05-17 NOTE — Progress Notes (Signed)
[facility] [place], [place] [address]  Internal MEDICINE  Office Visit Note  Patient Name: Wendy Garza  [id number]  [id number]  Date of Service: 05/17/2021  Chief Complaint  Patient presents with   Medicare Wellness   Hypertension   Hyperlipidemia   Quality Metric Gaps    mammogram    HPI Wendy Garza presents for an annual well visit and physical exam. she has a history of hypertension, aortic atherosclerosis, COPD, asthma, hypothyroidism, GAD, hyperlipidemia, and prediabetes. Her routine screening colonoscopy scgheduled for 07/03/21. Her last routine mammogram was 06/08/20, patient states she will call [place] to schedule her next mammogram for October this year. He has had 3 doses of the covid vaccine. She had her BMD screening on 09/28/16 and it showed osteopenia. She will consider repeating BMD screening but will decided at a later date. She denies any pain. She is a current every day smoker but unsure how much she smokes per day, it varies per patient report. She rarely drinks alcohol per patient report and denies recreational drug use.  -age appropriate screenings and immunizations as appropriate   Current Medication: Outpatient Encounter Medications as of 05/17/2021  Medication Sig   albuterol (VENTOLIN HFA) 108 (90 Base) MCG/ACT inhaler Inhale 2 puffs into the lungs every 6 (six) hours as needed for wheezing or shortness of breath. Reported on 11/10/2015   amLODipine (NORVASC) 2.5 MG tablet Take 1 tablet (2.5 mg total) by mouth daily.   budesonide-formoterol (SYMBICORT) 80-4.5 MCG/ACT inhaler Inhale 2 puffs into the lungs 2 (two) times daily.   ibuprofen (ADVIL) 200 MG tablet Take 600 mg by mouth every 8 (eight) hours as needed (for pain.).   levothyroxine (SYNTHROID) 50 MCG tablet TAKE 1 TABLET BY MOUTH EVERY DAY BEFORE BREAKFAST   metoprolol tartrate (LOPRESSOR) 50 MG tablet TAKE 1/2 OF A TABLET BY MOUTH TWICE A DAY   rosuvastatin (CRESTOR) 20 MG tablet  TAKE 1 TABLET BY MOUTH DAILY FOR HIGH CHOLESTEROL   sertraline (ZOLOFT) 50 MG tablet TAKE 1 TABLET BY MOUTH EVERY DAY   No facility-administered encounter medications on file as of 05/17/2021.    Surgical History: Past Surgical History:  Procedure Laterality Date   ABDOMINAL HYSTERECTOMY     BREAST SURGERY     BIOPSY   CATARACT EXTRACTION W/PHACO Left 10/04/2015   Procedure: CATARACT EXTRACTION PHACO AND INTRAOCULAR LENS PLACEMENT (IOC);  Surgeon: Birder Robson, MD;  Location: [facility] ORS;  Service: Ophthalmology;  Laterality: Left;  [place]: 00:46.8 AP%: 21.7 CDE: 10.19 Lot # [id number]   CATARACT EXTRACTION W/PHACO Right 10/25/2015   Procedure: CATARACT EXTRACTION PHACO AND INTRAOCULAR LENS PLACEMENT ([place]);  Surgeon: Birder Robson, MD;  Location: [facility] ORS;  Service: Ophthalmology;  Laterality: Right;  [place] 00:48 AP% 25.2 CDE 12.16 fluid pack lot # [id number]   COLONOSCOPY WITH PROPOFOL N/A 05/28/2016   Procedure: COLONOSCOPY WITH PROPOFOL;  Surgeon: Manya Silvas, MD;  Location: [facility] ENDOSCOPY;  Service: Endoscopy;  Laterality: N/A;   CYSTOSCOPY WITH BIOPSY N/A 08/31/2019   Procedure: CYSTOSCOPY WITH bladder BIOPSY;  Surgeon: Hollice Espy, MD;  Location: [facility] ORS;  Service: Urology;  Laterality: N/A;   CYSTOSCOPY WITH FULGERATION N/A 08/31/2019   Procedure: CYSTOSCOPY WITH FULGERATION;  Surgeon: Hollice Espy, MD;  Location: [facility] ORS;  Service: Urology;  Laterality: N/A;    Medical History: Past Medical History:  Diagnosis Date   Arthritis    Asthma    Cancer ([place]) 2003   UTERINE, total Hysterectomy   COPD (chronic obstructive  pulmonary disease) ([place])    Depression    Hyperlipidemia    Hypertension    Shortness of breath dyspnea    DOE    Family History: Family History  Problem Relation Age of Onset   Kidney disease Mother    Kidney cancer Neg Hx    Prostate cancer Neg Hx     Social History   Socioeconomic History   Marital status: Single    Spouse name: Not on file    Number of children: Not on file   Years of education: Not on file   Highest education level: Not on file  Occupational History   Not on file  Tobacco Use   Smoking status: Every Day    Years: 25.00    Types: Cigarettes   Smokeless tobacco: Never   Tobacco comments:    3 or 4 cigarettes a day-reported on 11/14/20  Vaping Use   Vaping Use: Never used  Substance and Sexual Activity   Alcohol use: No    Alcohol/week: 0.0 standard drinks   Drug use: No   Sexual activity: Not on file  Other Topics Concern   Not on file  Social History Narrative   Not on file   Social Determinants of Health   Financial Resource Strain: Low Risk    Difficulty of Paying Living Expenses: Not very hard  Food Insecurity: Not on file  Transportation Needs: Not on file  Physical Activity: Not on file  Stress: Not on file  Social Connections: Not on file  Intimate Partner Violence: Not on file      Review of Systems  Constitutional:  Negative for activity change, appetite change, chills, fatigue, fever and unexpected weight change.  HENT: Negative.  Negative for congestion, ear pain, rhinorrhea, sore throat and trouble swallowing.   Eyes: Negative.   Respiratory: Negative.  Negative for cough, chest tightness, shortness of breath and wheezing.   Cardiovascular: Negative.  Negative for chest pain.  Gastrointestinal: Negative.  Negative for abdominal pain, blood in stool, constipation, diarrhea, nausea and vomiting.  Endocrine: Negative.   Genitourinary: Negative.  Negative for difficulty urinating, dysuria, frequency, hematuria and urgency.  Musculoskeletal: Negative.  Negative for arthralgias, back pain, joint swelling, myalgias and neck pain.  Skin: Negative.  Negative for rash and wound.  Allergic/Immunologic: Negative.  Negative for immunocompromised state.  Neurological: Negative.  Negative for dizziness, seizures, numbness and headaches.  Hematological: Negative.   Psychiatric/Behavioral:  Negative.  Negative for behavioral problems, self-injury and suicidal ideas. The patient is not nervous/anxious.    Vital Signs: BP (!) 149/76   Pulse 78   Temp 98.4 F (36.9 C)   Resp 16   Ht '5\' 4"'  (1.626 m)   Wt 159 lb 6.4 oz (72.3 kg)   LMP  (LMP Unknown)   SpO2 97%   BMI 27.36 kg/m    Physical Exam Constitutional:      General: She is awake. She is not in acute distress.    Appearance: Normal appearance. She is well-developed, well-groomed and normal weight. She is not ill-appearing or diaphoretic.  HENT:     Head: Normocephalic and atraumatic.     Right Ear: Tympanic membrane, ear canal and external ear normal.     Left Ear: Tympanic membrane, ear canal and external ear normal.     Nose: Nose normal. No congestion or rhinorrhea.     Mouth/Throat:     Lips: Pink.     Mouth: Mucous membranes are moist.  Pharynx: Oropharynx is clear. Uvula midline. No oropharyngeal exudate or posterior oropharyngeal erythema.  Eyes:     General: Lids are normal. Vision grossly intact. Gaze aligned appropriately. No scleral icterus.       Right eye: No discharge.        Left eye: No discharge.     Extraocular Movements: Extraocular movements intact.     Conjunctiva/sclera: Conjunctivae normal.     Pupils: Pupils are equal, round, and reactive to light.     Funduscopic exam:    Right eye: Red reflex present.        Left eye: Red reflex present. Neck:     Thyroid: No thyromegaly.     Vascular: No carotid bruit or JVD.     Trachea: Trachea and phonation normal. No tracheal deviation.  Cardiovascular:     Rate and Rhythm: Normal rate and regular rhythm.     Pulses: Normal pulses.     Heart sounds: Normal heart sounds, S1 normal and S2 normal. No murmur heard.   No friction rub. No gallop.  Pulmonary:     Effort: Pulmonary effort is normal. No accessory muscle usage or respiratory distress.     Breath sounds: Normal breath sounds and air entry. No stridor. No wheezing or rales.   Chest:     Chest wall: No tenderness.     Comments: Declined clinical breast exam, gets yearly mammograms.  Abdominal:     General: Bowel sounds are normal. There is no distension.     Palpations: Abdomen is soft. There is no shifting dullness, fluid wave, mass or pulsatile mass.     Tenderness: There is no abdominal tenderness. There is no guarding or rebound.  Musculoskeletal:        General: No tenderness or deformity. Normal range of motion.     Cervical back: Normal range of motion and neck supple.     Right lower leg: No edema.     Left lower leg: No edema.  Lymphadenopathy:     Cervical: No cervical adenopathy.  Skin:    General: Skin is warm and dry.     Capillary Refill: Capillary refill takes less than 2 seconds.     Coloration: Skin is not pale.     Findings: No erythema or rash.  Neurological:     Mental Status: She is alert and oriented to person, place, and time.     Cranial Nerves: No cranial nerve deficit.     Motor: No abnormal muscle tone.     Coordination: Coordination normal.     Gait: Gait normal.     Deep Tendon Reflexes: Reflexes are normal and symmetric.  Psychiatric:        Mood and Affect: Mood and affect normal.        Behavior: Behavior normal. Behavior is cooperative.        Thought Content: Thought content normal.        Judgment: Judgment normal.       Assessment/Plan: 1. Encounter for general adult medical examination with abnormal findings Age-appropriate preventive screenings and vaccinations discussed, annual physical exam completed. Routine labs for health maintenance ordered, see below. PHM updated. Colonoscopy scheduled, mammogram due in October.   2. Prediabetes Routine lab ordered.  - CMP14+EGFR  3. Essential hypertension Blood pressure is elevated, start amlodipine 2.5 mg daily. Follow up in 4 weeks. - amLODipine (NORVASC) 2.5 MG tablet; Take 1 tablet (2.5 mg total) by mouth daily.  Dispense: 30 tablet; Refill: 1  4.  Atherosclerosis of aorta (HCC) Routine lab ordered, on statin therapy--rosuvastatin 57m daily.  - CBC with Differential/Platelet  5. Subclinical hypothyroidism Check current thyroid levels, taking levothyroxine daily.  - TSH + free T4  6. Vitamin D deficiency Rule out low vitamin D - Vitamin D (25 hydroxy)  7. Dysuria Routine urinalysis done - UA/M w/rflx Culture, Routine - Microscopic Examination - Urine Culture, Reflex  8. Needs flu shot Administered during office visit.  - Flu Vaccine MDCK QUAD PF      General Counseling: Dsherion doolyunderstanding of the findings of todays visit and agrees with plan of treatment. I have discussed any further diagnostic evaluation that may be needed or ordered today. We also reviewed her medications today. she has been encouraged to call the office with any questions or concerns that should arise related to todays visit.    Orders Placed This Encounter  Procedures   Microscopic Examination   Urine Culture, Reflex   Flu Vaccine MDCK QUAD PF   UA/M w/rflx Culture, Routine   CBC with Differential/Platelet   CMP14+EGFR   TSH + free T4   Vitamin D (25 hydroxy)    Meds ordered this encounter  Medications   amLODipine (NORVASC) 2.5 MG tablet    Sig: Take 1 tablet (2.5 mg total) by mouth daily.    Dispense:  30 tablet    Refill:  1    Return in about 4 weeks (around 06/14/2021) for F/U, BP check, Laylamarie Meuser PCP.   Total time spent:30 Minutes Time spent includes review of chart, medications, test results, and follow up plan with the patient.   [place] Controlled Substance Database was reviewed by me.  This patient was seen by AJonetta Osgood FNP-C in collaboration with Dr. FClayborn Bignessas a part of collaborative care agreement.  Tiyah Zelenak R. AValetta Fuller MSN, FNP-C Internal medicine

## 2021-05-22 DIAGNOSIS — I700 Atherosclerosis of aorta: Secondary | ICD-10-CM | POA: Diagnosis not present

## 2021-05-22 DIAGNOSIS — E038 Other specified hypothyroidism: Secondary | ICD-10-CM | POA: Diagnosis not present

## 2021-05-22 DIAGNOSIS — R7303 Prediabetes: Secondary | ICD-10-CM | POA: Diagnosis not present

## 2021-05-22 DIAGNOSIS — E559 Vitamin D deficiency, unspecified: Secondary | ICD-10-CM | POA: Diagnosis not present

## 2021-05-23 LAB — CMP14+EGFR
ALT: 18 IU/L (ref 0–32)
AST: 24 IU/L (ref 0–40)
Albumin/Globulin Ratio: 1.7 (ref 1.2–2.2)
Albumin: 4.3 g/dL (ref 3.7–4.7)
Alkaline Phosphatase: 70 IU/L (ref 44–121)
BUN/Creatinine Ratio: 14 (ref 12–28)
BUN: 13 mg/dL (ref 8–27)
Bilirubin Total: 0.4 mg/dL (ref 0.0–1.2)
CO2: 21 mmol/L (ref 20–29)
Calcium: 9.3 mg/dL (ref 8.7–10.3)
Chloride: 105 mmol/L (ref 96–106)
Creatinine, Ser: 0.96 mg/dL (ref 0.57–1.00)
Globulin, Total: 2.5 g/dL (ref 1.5–4.5)
Glucose: 124 mg/dL — ABNORMAL HIGH (ref 70–99)
Potassium: 4.9 mmol/L (ref 3.5–5.2)
Sodium: 141 mmol/L (ref 134–144)
Total Protein: 6.8 g/dL (ref 6.0–8.5)
eGFR: 63 mL/min/{1.73_m2} (ref >59)

## 2021-05-23 LAB — TSH+FREE T4
Free T4: 1.35 ng/dL (ref 0.82–1.77)
TSH: 1.47 u[IU]/mL (ref 0.450–4.500)

## 2021-05-23 LAB — VITAMIN D 25 HYDROXY (VIT D DEFICIENCY, FRACTURES): Vit D, 25-Hydroxy: 25.4 ng/mL — ABNORMAL LOW (ref 30.0–100.0)

## 2021-05-23 LAB — CBC WITH DIFFERENTIAL/PLATELET
Basophils Absolute: 0.1 10*3/uL (ref 0.0–0.2)
Basos: 1 %
EOS (ABSOLUTE): 0.4 10*3/uL (ref 0.0–0.4)
Eos: 4 %
Hematocrit: 42.8 % (ref 34.0–46.6)
Hemoglobin: 14.2 g/dL (ref 11.1–15.9)
Immature Grans (Abs): 0.1 10*3/uL (ref 0.0–0.1)
Immature Granulocytes: 1 %
Lymphocytes Absolute: 2.3 10*3/uL (ref 0.7–3.1)
Lymphs: 25 %
MCH: 31.9 pg (ref 26.6–33.0)
MCHC: 33.2 g/dL (ref 31.5–35.7)
MCV: 96 fL (ref 79–97)
Monocytes Absolute: 0.9 10*3/uL (ref 0.1–0.9)
Monocytes: 10 %
Neutrophils Absolute: 5.3 10*3/uL (ref 1.4–7.0)
Neutrophils: 59 %
Platelets: 300 10*3/uL (ref 150–450)
RBC: 4.45 x10E6/uL (ref 3.77–5.28)
RDW: 12.8 % (ref 11.7–15.4)
WBC: 9 10*3/uL (ref 3.4–10.8)

## 2021-05-24 NOTE — Progress Notes (Signed)
I have review the lab results and there are no abnormal values that require immediate intervention but there are some abnormals that I will discuss with the patient at her next office visit in a few weeks.  Urine culture did come back positive but if patient has no symptoms, no treatment necessary. If she develops symptoms of a UTI, I will order appropriate antibiotic treatment.

## 2021-05-26 DIAGNOSIS — I10 Essential (primary) hypertension: Secondary | ICD-10-CM | POA: Diagnosis not present

## 2021-05-26 DIAGNOSIS — E038 Other specified hypothyroidism: Secondary | ICD-10-CM | POA: Diagnosis not present

## 2021-05-26 DIAGNOSIS — I700 Atherosclerosis of aorta: Secondary | ICD-10-CM | POA: Diagnosis not present

## 2021-05-26 LAB — UA/M W/RFLX CULTURE, ROUTINE
Bilirubin, UA: NEGATIVE
Glucose, UA: NEGATIVE
Nitrite, UA: NEGATIVE
Specific Gravity, UA: 1.021 (ref 1.005–1.030)
Urobilinogen, Ur: 0.2 mg/dL (ref 0.2–1.0)
pH, UA: 5.5 (ref 5.0–7.5)

## 2021-05-26 LAB — URINE CULTURE, REFLEX

## 2021-05-26 LAB — MICROSCOPIC EXAMINATION
Bacteria, UA: NONE SEEN
Casts: NONE SEEN /lpf

## 2021-05-29 ENCOUNTER — Telehealth: Payer: Self-pay

## 2021-05-29 NOTE — Telephone Encounter (Signed)
-----   Message from [name], NP sent at 05/24/2021  8:08 AM EDT ----- I have review the lab results and there are no abnormal values that require immediate intervention but there are some abnormals that I will discuss with the patient at her next office visit in a few weeks.  Urine culture did come back positive but if patient has no symptoms, no treatment necessary. If she develops symptoms of a UTI, I will order appropriate antibiotic treatment.

## 2021-06-08 ENCOUNTER — Other Ambulatory Visit: Payer: Self-pay | Admitting: Nurse Practitioner

## 2021-06-08 DIAGNOSIS — I10 Essential (primary) hypertension: Secondary | ICD-10-CM

## 2021-06-14 ENCOUNTER — Ambulatory Visit (INDEPENDENT_AMBULATORY_CARE_PROVIDER_SITE_OTHER): Payer: PPO | Admitting: Nurse Practitioner

## 2021-06-14 ENCOUNTER — Other Ambulatory Visit: Payer: Self-pay

## 2021-06-14 ENCOUNTER — Encounter: Payer: Self-pay | Admitting: Nurse Practitioner

## 2021-06-14 VITALS — BP 126/64 | HR 78 | Temp 98.3°F | Resp 16 | Ht 64.0 in | Wt 159.8 lb

## 2021-06-14 DIAGNOSIS — I10 Essential (primary) hypertension: Secondary | ICD-10-CM

## 2021-06-14 DIAGNOSIS — E559 Vitamin D deficiency, unspecified: Secondary | ICD-10-CM

## 2021-06-14 DIAGNOSIS — E782 Mixed hyperlipidemia: Secondary | ICD-10-CM | POA: Diagnosis not present

## 2021-06-14 NOTE — Progress Notes (Signed)
[facility] [place], [place] [address]  Internal MEDICINE  Office Visit Note  Patient Name: Wendy Garza  [id number]  [id number]  Date of Service: 06/14/2021  Chief Complaint  Patient presents with   Follow-up   Depression   Hyperlipidemia   Hypertension    HPI Wendy Garza presents for a follow up visit for hypertension and to review lab results. Her vitamin D level is slightly low.  CBC and thyroid levels are normal. Her metabolic panel is normal except for an elevated glucose level.  Lipid panel is abnormal with elevated triglycerides at 201, low HDL at 25, total cholesterol, VLDL and LDL are normal. She is currently taking rosuvastatin.  Her blood pressure is currently well controlled with amlodipine.    Current Medication: Outpatient Encounter Medications as of 06/14/2021  Medication Sig   albuterol (VENTOLIN HFA) 108 (90 Base) MCG/ACT inhaler Inhale 2 puffs into the lungs every 6 (six) hours as needed for wheezing or shortness of breath. Reported on 11/10/2015   amLODipine (NORVASC) 2.5 MG tablet TAKE 1 TABLET BY MOUTH EVERY DAY   budesonide-formoterol (SYMBICORT) 80-4.5 MCG/ACT inhaler Inhale 2 puffs into the lungs 2 (two) times daily.   ibuprofen (ADVIL) 200 MG tablet Take 600 mg by mouth every 8 (eight) hours as needed (for pain.).   levothyroxine (SYNTHROID) 50 MCG tablet TAKE 1 TABLET BY MOUTH EVERY DAY BEFORE BREAKFAST   metoprolol tartrate (LOPRESSOR) 50 MG tablet TAKE 1/2 OF A TABLET BY MOUTH TWICE A DAY   rosuvastatin (CRESTOR) 20 MG tablet TAKE 1 TABLET BY MOUTH DAILY FOR HIGH CHOLESTEROL   sertraline (ZOLOFT) 50 MG tablet TAKE 1 TABLET BY MOUTH EVERY DAY   No facility-administered encounter medications on file as of 06/14/2021.    Surgical History: Past Surgical History:  Procedure Laterality Date   ABDOMINAL HYSTERECTOMY     BREAST SURGERY     BIOPSY   CATARACT EXTRACTION W/PHACO Left 10/04/2015   Procedure: CATARACT EXTRACTION PHACO  AND INTRAOCULAR LENS PLACEMENT (IOC);  Surgeon: Birder Robson, MD;  Location: [facility] ORS;  Service: Ophthalmology;  Laterality: Left;  [place]: 00:46.8 AP%: 21.7 CDE: 10.19 Lot # [id number]   CATARACT EXTRACTION W/PHACO Right 10/25/2015   Procedure: CATARACT EXTRACTION PHACO AND INTRAOCULAR LENS PLACEMENT ([place]);  Surgeon: Birder Robson, MD;  Location: [facility] ORS;  Service: Ophthalmology;  Laterality: Right;  [place] 00:48 AP% 25.2 CDE 12.16 fluid pack lot # [id number]   COLONOSCOPY WITH PROPOFOL N/A 05/28/2016   Procedure: COLONOSCOPY WITH PROPOFOL;  Surgeon: Manya Silvas, MD;  Location: [facility] ENDOSCOPY;  Service: Endoscopy;  Laterality: N/A;   CYSTOSCOPY WITH BIOPSY N/A 08/31/2019   Procedure: CYSTOSCOPY WITH bladder BIOPSY;  Surgeon: Hollice Espy, MD;  Location: [facility] ORS;  Service: Urology;  Laterality: N/A;   CYSTOSCOPY WITH FULGERATION N/A 08/31/2019   Procedure: CYSTOSCOPY WITH FULGERATION;  Surgeon: Hollice Espy, MD;  Location: [facility] ORS;  Service: Urology;  Laterality: N/A;    Medical History: Past Medical History:  Diagnosis Date   Arthritis    Asthma    Cancer ([place]) 2003   UTERINE, total Hysterectomy   COPD (chronic obstructive pulmonary disease) (HCC)    Depression    Hyperlipidemia    Hypertension    Shortness of breath dyspnea    DOE    Family History: Family History  Problem Relation Age of Onset   Kidney disease Mother    Kidney cancer Neg Hx    Prostate cancer Neg Hx     Social  History   Socioeconomic History   Marital status: Single    Spouse name: Not on file   Number of children: Not on file   Years of education: Not on file   Highest education level: Not on file  Occupational History   Not on file  Tobacco Use   Smoking status: Every Day    Years: 25.00    Types: Cigarettes   Smokeless tobacco: Never   Tobacco comments:    3 or 4 cigarettes a day-reported on 11/14/20  Vaping Use   Vaping Use: Never used  Substance and Sexual Activity   Alcohol use:  No    Alcohol/week: 0.0 standard drinks   Drug use: No   Sexual activity: Not on file  Other Topics Concern   Not on file  Social History Narrative   Not on file   Social Determinants of Health   Financial Resource Strain: Low Risk    Difficulty of Paying Living Expenses: Not very hard  Food Insecurity: Not on file  Transportation Needs: Not on file  Physical Activity: Not on file  Stress: Not on file  Social Connections: Not on file  Intimate Partner Violence: Not on file      Review of Systems  Constitutional:  Negative for chills, fatigue and unexpected weight change.  HENT:  Negative for congestion, rhinorrhea, sneezing and sore throat.   Eyes:  Negative for redness.  Respiratory:  Negative for cough, chest tightness and shortness of breath.   Cardiovascular:  Negative for chest pain and palpitations.  Gastrointestinal:  Negative for abdominal pain, constipation, diarrhea, nausea and vomiting.  Genitourinary:  Negative for dysuria and frequency.  Musculoskeletal:  Negative for arthralgias, back pain, joint swelling and neck pain.  Skin:  Negative for rash.  Neurological: Negative.  Negative for tremors and numbness.  Hematological:  Negative for adenopathy. Does not bruise/bleed easily.  Psychiatric/Behavioral:  Negative for behavioral problems (Depression), sleep disturbance and suicidal ideas. The patient is not nervous/anxious.    Vital Signs: BP 126/64   Pulse 78   Temp 98.3 F (36.8 C)   Resp 16   Ht 5\' 4"  (1.626 m)   Wt 159 lb 12.8 oz (72.5 kg)   LMP  (LMP Unknown)   SpO2 97%   BMI 27.43 kg/m    Physical Exam Vitals reviewed.  Constitutional:      General: She is not in acute distress.    Appearance: Normal appearance. She is normal weight. She is not ill-appearing.  HENT:     Head: Normocephalic and atraumatic.  Eyes:     Extraocular Movements: Extraocular movements intact.     Pupils: Pupils are equal, round, and reactive to light.   Cardiovascular:     Rate and Rhythm: Normal rate and regular rhythm.  Pulmonary:     Effort: Pulmonary effort is normal. No respiratory distress.  Neurological:     Mental Status: She is alert and oriented to person, place, and time.     Cranial Nerves: No cranial nerve deficit.     Coordination: Coordination normal.     Gait: Gait normal.  Psychiatric:        Mood and Affect: Mood normal.        Behavior: Behavior normal.       Assessment/Plan: 1. Essential hypertension Stable with current medication, no changes, continue as prescribed.   2. Vitamin D deficiency Slightly low level, encouraged patient to take OTC vitamin D supplement 2000-5000 units daily.   3.  Mixed hyperlipidemia Taking rosuvastatin, continue as prescribed.    General Counseling: azharia surratt understanding of the findings of todays visit and agrees with plan of treatment. I have discussed any further diagnostic evaluation that may be needed or ordered today. We also reviewed her medications today. she has been encouraged to call the office with any questions or concerns that should arise related to todays visit.    No orders of the defined types were placed in this encounter.   No orders of the defined types were placed in this encounter.   Return in about 6 months (around 12/13/2021) for F/U, med refill, Arcenia Scarbro PCP.   Total time spent:15 Minutes Time spent includes review of chart, medications, test results, and follow up plan with the patient.   [place] Controlled Substance Database was reviewed by me.  This patient was seen by [name], FNP-C in collaboration with Dr. Clayborn Bigness as a part of collaborative care agreement.   Risha Barretta R. Valetta Fuller, MSN, FNP-C Internal medicine

## 2021-06-22 DIAGNOSIS — Z1231 Encounter for screening mammogram for malignant neoplasm of breast: Secondary | ICD-10-CM | POA: Diagnosis not present

## 2021-07-03 ENCOUNTER — Encounter: Payer: Self-pay | Admitting: Gastroenterology

## 2021-07-03 ENCOUNTER — Ambulatory Visit: Payer: PPO | Admitting: Certified Registered Nurse Anesthetist

## 2021-07-03 ENCOUNTER — Encounter
Admission: RE | Disposition: A | Discharge status: Home / Self Care | Payer: Self-pay | Source: Home / Self Care | Attending: Gastroenterology

## 2021-07-03 ENCOUNTER — Other Ambulatory Visit: Payer: Self-pay

## 2021-07-03 ENCOUNTER — Ambulatory Visit
Admission: RE | Admit: 2021-07-03 | Discharge: 2021-07-03 | Disposition: A | Discharge status: Home / Self Care | Payer: PPO | Attending: Gastroenterology | Admitting: Gastroenterology

## 2021-07-03 DIAGNOSIS — Z860100 Personal history of colon polyps, unspecified: Secondary | ICD-10-CM

## 2021-07-03 DIAGNOSIS — J449 Chronic obstructive pulmonary disease, unspecified: Secondary | ICD-10-CM | POA: Insufficient documentation

## 2021-07-03 DIAGNOSIS — Z7951 Long term (current) use of inhaled steroids: Secondary | ICD-10-CM | POA: Insufficient documentation

## 2021-07-03 DIAGNOSIS — K644 Residual hemorrhoidal skin tags: Secondary | ICD-10-CM | POA: Insufficient documentation

## 2021-07-03 DIAGNOSIS — Z1211 Encounter for screening for malignant neoplasm of colon: Secondary | ICD-10-CM | POA: Insufficient documentation

## 2021-07-03 DIAGNOSIS — E782 Mixed hyperlipidemia: Secondary | ICD-10-CM | POA: Diagnosis not present

## 2021-07-03 DIAGNOSIS — I10 Essential (primary) hypertension: Secondary | ICD-10-CM | POA: Diagnosis not present

## 2021-07-03 DIAGNOSIS — K5730 Diverticulosis of large intestine without perforation or abscess without bleeding: Secondary | ICD-10-CM | POA: Diagnosis not present

## 2021-07-03 DIAGNOSIS — K649 Unspecified hemorrhoids: Secondary | ICD-10-CM | POA: Diagnosis not present

## 2021-07-03 HISTORY — PX: COLONOSCOPY WITH PROPOFOL: SHX5780

## 2021-07-03 SURGERY — COLONOSCOPY WITH PROPOFOL
Anesthesia: General

## 2021-07-03 MED ORDER — PROPOFOL 500 MG/50ML IV EMUL
INTRAVENOUS | Status: DC | PRN
  Administered 2021-07-03: 175 ug/kg/min via INTRAVENOUS

## 2021-07-03 MED ORDER — PROPOFOL 10 MG/ML IV BOLUS
INTRAVENOUS | Status: DC | PRN
  Administered 2021-07-03: 50 mg via INTRAVENOUS

## 2021-07-03 MED ORDER — SODIUM CHLORIDE 0.9 % IV SOLN: INTRAVENOUS | Status: DC

## 2021-07-03 MED ORDER — LIDOCAINE HCL (CARDIAC) PF 100 MG/5ML IV SOSY
PREFILLED_SYRINGE | INTRAVENOUS | Status: DC | PRN
  Administered 2021-07-03: 50 mg via INTRAVENOUS

## 2021-07-03 NOTE — H&P (Signed)
Cephas Darby, MD [address]  [place]  [place], [place] [address]  Main: [phone]  Fax: [phone] Pager: [phone]  Primary Care Physician:  Lavera Guise, MD Primary Gastroenterologist:  Dr. Cephas Darby  Pre-Procedure History & Physical: HPI:  Wendy Garza is a 75 y.o. female is here for an colonoscopy.   Past Medical History:  Diagnosis Date   Arthritis    Asthma    Cancer ([place]) 2003   UTERINE, total Hysterectomy   COPD (chronic obstructive pulmonary disease) (HCC)    Depression    Hyperlipidemia    Hypertension    Shortness of breath dyspnea    DOE    Past Surgical History:  Procedure Laterality Date   ABDOMINAL HYSTERECTOMY     BREAST SURGERY     BIOPSY   CATARACT EXTRACTION W/PHACO Left 10/04/2015   Procedure: CATARACT EXTRACTION PHACO AND INTRAOCULAR LENS PLACEMENT ([place]);  Surgeon: Birder Robson, MD;  Location: [facility] ORS;  Service: Ophthalmology;  Laterality: Left;  [place]: 00:46.8 AP%: 21.7 CDE: 10.19 Lot # [id number]   CATARACT EXTRACTION W/PHACO Right 10/25/2015   Procedure: CATARACT EXTRACTION PHACO AND INTRAOCULAR LENS PLACEMENT ([place]);  Surgeon: Birder Robson, MD;  Location: [facility] ORS;  Service: Ophthalmology;  Laterality: Right;  [place] 00:48 AP% 25.2 CDE 12.16 fluid pack lot # [id number]   COLONOSCOPY WITH PROPOFOL N/A 05/28/2016   Procedure: COLONOSCOPY WITH PROPOFOL;  Surgeon: Manya Silvas, MD;  Location: [facility] ENDOSCOPY;  Service: Endoscopy;  Laterality: N/A;   CYSTOSCOPY WITH BIOPSY N/A 08/31/2019   Procedure: CYSTOSCOPY WITH bladder BIOPSY;  Surgeon: Hollice Espy, MD;  Location: [facility] ORS;  Service: Urology;  Laterality: N/A;   CYSTOSCOPY WITH FULGERATION N/A 08/31/2019   Procedure: CYSTOSCOPY WITH FULGERATION;  Surgeon: Hollice Espy, MD;  Location: [facility] ORS;  Service: Urology;  Laterality: N/A;    Prior to Admission medications   Medication Sig Start Date End Date Taking? Authorizing Provider  albuterol (VENTOLIN HFA) 108 (90  Base) MCG/ACT inhaler Inhale 2 puffs into the lungs every 6 (six) hours as needed for wheezing or shortness of breath. Reported on 11/10/2015 02/13/21  Yes McDonough, Lauren K, PA-C  amLODipine (NORVASC) 2.5 MG tablet TAKE 1 TABLET BY MOUTH EVERY DAY 06/08/21  Yes Abernathy, Alyssa, NP  budesonide-formoterol (SYMBICORT) 80-4.5 MCG/ACT inhaler Inhale 2 puffs into the lungs 2 (two) times daily. 02/13/21  Yes McDonough, Lauren K, PA-C  ibuprofen (ADVIL) 200 MG tablet Take 600 mg by mouth every 8 (eight) hours as needed (for pain.).   Yes [provider]  levothyroxine (SYNTHROID) 50 MCG tablet TAKE 1 TABLET BY MOUTH EVERY DAY BEFORE BREAKFAST 04/03/21  Yes Lavera Guise, MD  metoprolol tartrate (LOPRESSOR) 50 MG tablet TAKE 1/2 OF A TABLET BY MOUTH TWICE A DAY 03/30/21  Yes Lavera Guise, MD  rosuvastatin (CRESTOR) 20 MG tablet TAKE 1 TABLET BY MOUTH DAILY FOR HIGH CHOLESTEROL 02/08/21  Yes Lavera Guise, MD  sertraline (ZOLOFT) 50 MG tablet TAKE 1 TABLET BY MOUTH EVERY DAY 05/04/21  Yes Lavera Guise, MD    Allergies as of 09/29/2020 - Review Complete 09/29/2020  Allergen Reaction Noted   Aspirin Other (See Comments) 10/03/2015   Crab [shellfish allergy] Hives 08/19/2019   Latex Rash 08/10/2019   Nickel Rash 11/10/2015    Family History  Problem Relation Age of Onset   Kidney disease Mother    Kidney cancer Neg Hx    Prostate cancer Neg Hx     Social  History   Socioeconomic History   Marital status: Single    Spouse name: Not on file   Number of children: Not on file   Years of education: Not on file   Highest education level: Not on file  Occupational History   Not on file  Tobacco Use   Smoking status: Every Day    Years: 25.00    Types: Cigarettes   Smokeless tobacco: Never   Tobacco comments:    3 or 4 cigarettes a day-reported on 11/14/20  Vaping Use   Vaping Use: Never used  Substance and Sexual Activity   Alcohol use: Yes    Comment: rarely   Drug use: No   Sexual  activity: Not on file  Other Topics Concern   Not on file  Social History Narrative   Not on file   Social Determinants of Health   Financial Resource Strain: Low Risk    Difficulty of Paying Living Expenses: Not very hard  Food Insecurity: Not on file  Transportation Needs: Not on file  Physical Activity: Not on file  Stress: Not on file  Social Connections: Not on file  Intimate Partner Violence: Not on file    Review of Systems: See HPI, otherwise negative ROS  Physical Exam: BP 133/74   Pulse 86   Temp (!) 97.4 F (36.3 C) (Temporal)   Resp 18   Ht 5\' 4"  (1.626 m)   Wt 72.6 kg   LMP  (LMP Unknown)   SpO2 96%   BMI 27.46 kg/m  General:   Alert,  pleasant and cooperative in NAD Head:  Normocephalic and atraumatic. Neck:  Supple; no masses or thyromegaly. Lungs:  Clear throughout to auscultation.    Heart:  Regular rate and rhythm. Abdomen:  Soft, nontender and nondistended. Normal bowel sounds, without guarding, and without rebound.   Neurologic:  Alert and  oriented x4;  grossly normal neurologically.  Impression/Plan: Wendy Garza is here for an colonoscopy to be performed for h/o colon polyps  Risks, benefits, limitations, and alternatives regarding  colonoscopy have been reviewed with the patient.  Questions have been answered.  All parties agreeable.   Sherri Sear, MD  07/03/2021, 7:40 AM

## 2021-07-03 NOTE — Anesthesia Preprocedure Evaluation (Signed)
Anesthesia Evaluation  Patient identified by MRN, date of birth, ID band Patient awake    Reviewed: Allergy & Precautions, H&P , NPO status , Patient's Chart, lab work & pertinent test results, reviewed documented beta blocker date and time   Airway Mallampati: II   Neck ROM: full    Dental  (+) Poor Dentition   Pulmonary shortness of breath and with exertion, asthma , COPD,  COPD inhaler, Current SmokerPatient did not abstain from smoking.,    Pulmonary exam normal        Cardiovascular Exercise Tolerance: Poor hypertension, On Medications negative cardio ROS Normal cardiovascular exam Rhythm:regular Rate:Normal     Neuro/Psych PSYCHIATRIC DISORDERS Anxiety Depression negative neurological ROS     GI/Hepatic negative GI ROS, Neg liver ROS,   Endo/Other  Hypothyroidism   Renal/GU negative Renal ROS  negative genitourinary   Musculoskeletal   Abdominal   Peds  Hematology negative hematology ROS (+)   Anesthesia Other Findings Past Medical History: No date: Arthritis No date: Asthma 2003: Cancer ([place])     Comment:  UTERINE, total Hysterectomy No date: COPD (chronic obstructive pulmonary disease) (HCC) No date: Depression No date: Hyperlipidemia No date: Hypertension No date: Shortness of breath dyspnea     Comment:  DOE Past Surgical History: No date: ABDOMINAL HYSTERECTOMY No date: BREAST SURGERY     Comment:  BIOPSY 10/04/2015: CATARACT EXTRACTION W/PHACO; Left     Comment:  Procedure: CATARACT EXTRACTION PHACO AND INTRAOCULAR               LENS PLACEMENT ([facility]);  Surgeon: Birder Robson, MD;                Location: [facility] ORS;  Service: Ophthalmology;  Laterality:              Left;  [place]: 00:46.8 AP%: 21.7 CDE: 10.19 Lot # [id number] 10/25/2015: CATARACT EXTRACTION W/PHACO; Right     Comment:  Procedure: CATARACT EXTRACTION PHACO AND INTRAOCULAR               LENS PLACEMENT ([facility]);  Surgeon: Birder Robson, MD;                Location: [facility] ORS;  Service: Ophthalmology;  Laterality:              Right;  [place] 00:48 AP% 25.2 CDE 12.16 fluid pack lot #               [id number] 05/28/2016: COLONOSCOPY WITH PROPOFOL; N/A     Comment:  Procedure: COLONOSCOPY WITH PROPOFOL;  Surgeon: Manya Silvas, MD;  Location: [facility] ENDOSCOPY;  Service:               Endoscopy;  Laterality: N/A; 08/31/2019: CYSTOSCOPY WITH BIOPSY; N/A     Comment:  Procedure: CYSTOSCOPY WITH bladder BIOPSY;  Surgeon:               Hollice Espy, MD;  Location: [facility] ORS;  Service:               Urology;  Laterality: N/A; 08/31/2019: Consuela Mimes WITH FULGERATION; N/A     Comment:  Procedure: CYSTOSCOPY WITH FULGERATION;  Surgeon:               Hollice Espy, MD;  Location: [facility] ORS;  Service:               Urology;  Laterality: N/A;  Reproductive/Obstetrics negative OB ROS                             Anesthesia Physical Anesthesia Plan  ASA: 3  Anesthesia Plan: General   Post-op Pain Management:    Induction:   PONV Risk Score and Plan:   Airway Management Planned:   Additional Equipment:   Intra-op Plan:   Post-operative Plan:   Informed Consent: I have reviewed the patients History and Physical, chart, labs and discussed the procedure including the risks, benefits and alternatives for the proposed anesthesia with the patient or authorized representative who has indicated his/her understanding and acceptance.     Dental Advisory Given  Plan Discussed with: CRNA  Anesthesia Plan Comments:         Anesthesia Quick Evaluation

## 2021-07-03 NOTE — Anesthesia Procedure Notes (Signed)
Date/Time: 07/03/2021 7:50 AM Performed by: [name], CRNA Pre-anesthesia Checklist: Patient identified, Emergency Drugs available, Suction available, Patient being monitored and Timeout performed Patient Re-evaluated:Patient Re-evaluated prior to induction Oxygen Delivery Method: Nasal cannula Preoxygenation: Pre-oxygenation with 100% oxygen Induction Type: IV induction

## 2021-07-03 NOTE — Op Note (Signed)
[facility] Gastroenterology Patient Name: Wendy Garza Procedure Date: 07/03/2021 7:20 AM MRN: [id number] Account #: [id number] Date of Birth: December 12, 1945 Admit Type: Outpatient Age: 75 Room: [facility] ENDO ROOM 1 Gender: Female Note Status: Finalized Instrument Name: Jasper Riling [id number] Procedure:             Colonoscopy Indications:           Surveillance: Personal history of adenomatous polyps                         on last colonoscopy 5 years ago, Last colonoscopy:                         October 2017 Providers:             Lin Landsman MD, MD Referring MD:          Lavera Guise, MD (Referring MD) Medicines:             General Anesthesia Complications:         No immediate complications. Estimated blood loss: None. Procedure:             Pre-Anesthesia Assessment:                        - Prior to the procedure, a History and Physical was                         performed, and patient medications and allergies were                         reviewed. The patient is competent. The risks and                         benefits of the procedure and the sedation options and                         risks were discussed with the patient. All questions                         were answered and informed consent was obtained.                         Patient identification and proposed procedure were                         verified by the physician, the nurse, the                         anesthesiologist, the anesthetist and the technician                         in the pre-procedure area in the procedure room in the                         endoscopy suite. Mental Status Examination: alert and                         oriented. Airway Examination: normal oropharyngeal  airway and neck mobility. Respiratory Examination:                         clear to auscultation. CV Examination: normal.                         Prophylactic Antibiotics: The  patient does not require                         prophylactic antibiotics. Prior Anticoagulants: The                         patient has taken no previous anticoagulant or                         antiplatelet agents. ASA Grade Assessment: II - A                         patient with mild systemic disease. After reviewing                         the risks and benefits, the patient was deemed in                         satisfactory condition to undergo the procedure. The                         anesthesia plan was to use general anesthesia.                         Immediately prior to administration of medications,                         the patient was re-assessed for adequacy to receive                         sedatives. The heart rate, respiratory rate, oxygen                         saturations, blood pressure, adequacy of pulmonary                         ventilation, and response to care were monitored                         throughout the procedure. The physical status of the                         patient was re-assessed after the procedure.                        After obtaining informed consent, the colonoscope was                         passed under direct vision. Throughout the procedure,                         the patient's blood pressure, pulse, and oxygen  saturations were monitored continuously. The                         Colonoscope was introduced through the anus and                         advanced to the the cecum, identified by appendiceal                         orifice and ileocecal valve. The colonoscopy was                         performed without difficulty. The patient tolerated                         the procedure well. The quality of the bowel                         preparation was evaluated using the BBPS [facility] Bowel                         Preparation Scale) with scores of: Right Colon = 3,                         Transverse  Colon = 3 and Left Colon = 3 (entire mucosa                         seen well with no residual staining, small fragments                         of stool or opaque liquid). The total BBPS score                         equals 9. Findings:      The perianal and digital rectal examinations were normal. Pertinent       negatives include normal sphincter tone and no palpable rectal lesions.      Multiple diverticula were found in the sigmoid colon.      Non-bleeding external hemorrhoids were found during retroflexion. The       hemorrhoids were medium-sized.      The exam was otherwise without abnormality. Impression:            - Diverticulosis in the sigmoid colon.                        - Non-bleeding external hemorrhoids.                        - The examination was otherwise normal.                        - No specimens collected. Recommendation:        - Discharge patient to home (with escort).                        - Resume previous diet today.                        - Continue present  medications.                        - Repeat colonoscopy in 7 years for surveillance. Procedure Code(s):     --- Professional ---                        K2706, Colorectal cancer screening; colonoscopy on                         individual at high risk Diagnosis Code(s):     --- Professional ---                        Z86.010, Personal history of colonic polyps                        K64.4, Residual hemorrhoidal skin tags                        K57.30, Diverticulosis of large intestine without                         perforation or abscess without bleeding CPT copyright 2019 American Medical Association. All rights reserved. The codes documented in this report are preliminary and upon coder review may  be revised to meet current compliance requirements. Dr. Ulyess Mort Lin Landsman MD, MD 07/03/2021 8:12:52 AM This report has been signed electronically. Number of Addenda: 0 Note Initiated On:  07/03/2021 7:20 AM Scope Withdrawal Time: 0 hours 9 minutes 45 seconds  Total Procedure Duration: 0 hours 16 minutes 9 seconds  Estimated Blood Loss:  Estimated blood loss: none.      [facility]

## 2021-07-03 NOTE — Transfer of Care (Signed)
Immediate Anesthesia Transfer of Care Note  Patient: Wendy Garza  Procedure(s) Performed: COLONOSCOPY WITH PROPOFOL  Patient Location: PACU  Anesthesia Type:General  Level of Consciousness: sedated  Airway & Oxygen Therapy: Patient Spontanous Breathing and Patient connected to nasal cannula oxygen  Post-op Assessment: Report given to RN and Post -op Vital signs reviewed and stable  Post vital signs: Reviewed and stable  Last Vitals:  Vitals Value Taken Time  BP 87/53 07/03/21 0817  Temp    Pulse 76 07/03/21 0817  Resp 14 07/03/21 0817  SpO2 95 % 07/03/21 0817    Last Pain:  Vitals:   07/03/21 0706  TempSrc: Temporal  PainSc: 0-No pain         Complications: No notable events documented.

## 2021-07-04 ENCOUNTER — Encounter: Payer: Self-pay | Admitting: Gastroenterology

## 2021-07-04 NOTE — Anesthesia Postprocedure Evaluation (Signed)
Anesthesia Post Note  Patient: Wendy Garza  Procedure(s) Performed: COLONOSCOPY WITH PROPOFOL  Patient location during evaluation: PACU Anesthesia Type: General Level of consciousness: awake and alert Pain management: pain level controlled Vital Signs Assessment: post-procedure vital signs reviewed and stable Respiratory status: spontaneous breathing, nonlabored ventilation, respiratory function stable and patient connected to nasal cannula oxygen Cardiovascular status: blood pressure returned to baseline and stable Postop Assessment: no apparent nausea or vomiting Anesthetic complications: no   No notable events documented.   Last Vitals:  Vitals:   07/03/21 0836 07/03/21 0846  BP: (!) 88/68 115/62  Pulse: 73 68  Resp: 16 17  Temp:    SpO2: 96% 97%    Last Pain:  Vitals:   07/03/21 0846  TempSrc:   PainSc: 0-No pain                 Molli Barrows

## 2021-07-07 ENCOUNTER — Telehealth: Payer: Self-pay | Admitting: Student-PharmD

## 2021-07-07 NOTE — Progress Notes (Addendum)
  Chronic Care Management Pharmacy Assistant   Name: Wendy Garza  MRN: [id number] DOB: [birth year]  Reason for Encounter: General Disease State Call   Conditions to be addressed/monitored: HTN, Asthma, HLD, GAD, Pre-DM, Hypothyroidism  Recent office visits:  06/14/21 [name], NP. For follow-up. No medication changes. 05/17/21 [name], NP. For medicare wellness. STARTED Amlodipine 2.5 mg daily.   Recent consult visits:  06/22/21 Radiology For screening breast examination. No medication changes.   Hospital visits:  07/03/21 [facility] (2 Hours) Lin Landsman, MD. For colonoscopy.   Medications: Outpatient Encounter Medications as of 07/07/2021  Medication Sig   albuterol (VENTOLIN HFA) 108 (90 Base) MCG/ACT inhaler Inhale 2 puffs into the lungs every 6 (six) hours as needed for wheezing or shortness of breath. Reported on 11/10/2015   amLODipine (NORVASC) 2.5 MG tablet TAKE 1 TABLET BY MOUTH EVERY DAY   budesonide-formoterol (SYMBICORT) 80-4.5 MCG/ACT inhaler Inhale 2 puffs into the lungs 2 (two) times daily.   ibuprofen (ADVIL) 200 MG tablet Take 600 mg by mouth every 8 (eight) hours as needed (for pain.).   levothyroxine (SYNTHROID) 50 MCG tablet TAKE 1 TABLET BY MOUTH EVERY DAY BEFORE BREAKFAST   metoprolol tartrate (LOPRESSOR) 50 MG tablet TAKE 1/2 OF A TABLET BY MOUTH TWICE A DAY   rosuvastatin (CRESTOR) 20 MG tablet TAKE 1 TABLET BY MOUTH DAILY FOR HIGH CHOLESTEROL   sertraline (ZOLOFT) 50 MG tablet TAKE 1 TABLET BY MOUTH EVERY DAY   No facility-administered encounter medications on file as of 07/07/2021.   GEN CALL: Patient stated her colonoscopy went well and they asked her to return in 7 years. Patient stated her smoking is about the same 4-5 cigarettes a day. Patient stated she doesn't have any questions or concerns about her medications at this time. Patient stated she is active daily and has a well rounded diet daily. She  state she drinks plenty of water daily.   Care Gaps:N/A.  Star Rating Drugs:Rosuvastatin 20 mg 05/05/21 90 DS.   Follow-Up:Pharmacist Review  Charlann Lange, RMA Clinical Pharmacist Assistant [phone]  5 minutes spent in review, coordination, and documentation.  Reviewed by: Alena Bills, PharmD Clinical Pharmacist [phone]

## 2021-07-26 DIAGNOSIS — I700 Atherosclerosis of aorta: Secondary | ICD-10-CM

## 2021-07-26 DIAGNOSIS — I10 Essential (primary) hypertension: Secondary | ICD-10-CM

## 2021-07-26 DIAGNOSIS — E038 Other specified hypothyroidism: Secondary | ICD-10-CM

## 2021-08-03 ENCOUNTER — Other Ambulatory Visit: Payer: Self-pay | Admitting: Internal Medicine

## 2021-08-17 ENCOUNTER — Ambulatory Visit: Payer: PPO | Admitting: Physician Assistant

## 2021-09-04 NOTE — Progress Notes (Signed)
09/04/21 4:57 PM   Wendy Garza 1946/04/25 [id number]  Referring provider:  Ronnell Freshwater, NP [place],  [place] [address] No chief complaint on file.    HPI: Wendy Garza is a 76 y.o.female with a personal history of microscopic hematuria and bladder lesion , who presents today for 1 year follow-up with UA and urine cytology.   She had negative work-up in 2017 for hematuria.   Cystoscopy on 07/21/2019 noted a very small lesion involving the dome of the bladder. She is s/p bladder biopsy on 08/31/2019 that was negative for atypia and malignancy.   She denies any blood in her urine she reports that she has been having leakage when coughing, laughing, or sneezing. It has become bothersome and she wears pads. She also has urgency.    PMH: Past Medical History:  Diagnosis Date   Arthritis    Asthma    Cancer ([place]) 2003   UTERINE, total Hysterectomy   COPD (chronic obstructive pulmonary disease) (HCC)    Depression    Hyperlipidemia    Hypertension    Shortness of breath dyspnea    DOE    Surgical History: Past Surgical History:  Procedure Laterality Date   ABDOMINAL HYSTERECTOMY     BREAST SURGERY     BIOPSY   CATARACT EXTRACTION W/PHACO Left 10/04/2015   Procedure: CATARACT EXTRACTION PHACO AND INTRAOCULAR LENS PLACEMENT ([place]);  Surgeon: Birder Robson, MD;  Location: [facility] ORS;  Service: Ophthalmology;  Laterality: Left;  [place]: 00:46.8 AP%: 21.7 CDE: 10.19 Lot # [id number]   CATARACT EXTRACTION W/PHACO Right 10/25/2015   Procedure: CATARACT EXTRACTION PHACO AND INTRAOCULAR LENS PLACEMENT ([place]);  Surgeon: Birder Robson, MD;  Location: [facility] ORS;  Service: Ophthalmology;  Laterality: Right;  [place] 00:48 AP% 25.2 CDE 12.16 fluid pack lot # [id number]   COLONOSCOPY WITH PROPOFOL N/A 05/28/2016   Procedure: COLONOSCOPY WITH PROPOFOL;  Surgeon: Manya Silvas, MD;  Location: [facility] ENDOSCOPY;  Service: Endoscopy;  Laterality: N/A;   COLONOSCOPY  WITH PROPOFOL N/A 07/03/2021   Procedure: COLONOSCOPY WITH PROPOFOL;  Surgeon: Lin Landsman, MD;  Location: [facility] ENDOSCOPY;  Service: Gastroenterology;  Laterality: N/A;   CYSTOSCOPY WITH BIOPSY N/A 08/31/2019   Procedure: CYSTOSCOPY WITH bladder BIOPSY;  Surgeon: Hollice Espy, MD;  Location: [facility] ORS;  Service: Urology;  Laterality: N/A;   CYSTOSCOPY WITH FULGERATION N/A 08/31/2019   Procedure: CYSTOSCOPY WITH FULGERATION;  Surgeon: Hollice Espy, MD;  Location: [facility] ORS;  Service: Urology;  Laterality: N/A;    Home Medications:  Allergies as of 09/05/2021       Reactions   Aspirin Other (See Comments)   Patient cannot recall what happened   Crab [shellfish Allergy] Hives   Latex Rash   Nickel Rash        Medication List        Accurate as of September 04, 2021  4:57 PM. If you have any questions, ask your nurse or doctor.          albuterol 108 (90 Base) MCG/ACT inhaler Commonly known as: VENTOLIN HFA Inhale 2 puffs into the lungs every 6 (six) hours as needed for wheezing or shortness of breath. Reported on 11/10/2015   amLODipine 2.5 MG tablet Commonly known as: NORVASC TAKE 1 TABLET BY MOUTH EVERY DAY   budesonide-formoterol 80-4.5 MCG/ACT inhaler Commonly known as: SYMBICORT Inhale 2 puffs into the lungs 2 (two) times daily.   ibuprofen 200 MG tablet Commonly known as: ADVIL Take  600 mg by mouth every 8 (eight) hours as needed (for pain.).   levothyroxine 50 MCG tablet Commonly known as: SYNTHROID TAKE 1 TABLET BY MOUTH EVERY DAY BEFORE BREAKFAST   metoprolol tartrate 50 MG tablet Commonly known as: LOPRESSOR TAKE 1/2 OF A TABLET BY MOUTH TWICE A DAY   rosuvastatin 20 MG tablet Commonly known as: CRESTOR TAKE 1 TABLET BY MOUTH DAILY FOR HIGH CHOLESTEROL   sertraline 50 MG tablet Commonly known as: ZOLOFT TAKE 1 TABLET BY MOUTH EVERY DAY        Allergies:  Allergies  Allergen Reactions   Aspirin Other (See Comments)    Patient cannot  recall what happened   Crab [Shellfish Allergy] Hives   Latex Rash   Nickel Rash    Family History: Family History  Problem Relation Age of Onset   Kidney disease Mother    Kidney cancer Neg Hx    Prostate cancer Neg Hx     Social History:  reports that she has been smoking cigarettes. She has never used smokeless tobacco. She reports current alcohol use. She reports that she does not use drugs.   Physical Exam: LMP  (LMP Unknown)   Constitutional:  Alert and oriented, No acute distress. HEENT: [place] AT, moist mucus membranes.  Trachea midline, no masses. Cardiovascular: No clubbing, cyanosis, or edema. Respiratory: Normal respiratory effort, no increased work of breathing. Skin: No rashes, bruises or suspicious lesions. Neurologic: Grossly intact, no focal deficits, moving all 4 extremities. Psychiatric: Normal mood and affect.  Laboratory Data:  Lab Results  Component Value Date   CREATININE 0.96 05/22/2021   Lab Results  Component Value Date   HGBA1C 5.4 01/18/2021    Urinalysis 3-10 RBC  Assessment & Plan:   Microscopic blood  - Urinalysis today showed persistent microscopic blood  - Recommend repeat modified hematuria workup with in office cystoscopy and RUS.  - Urine sent for cytology today  - RUS; Scheduled - Cystoscopy; scheduled   2. Stress incontinence/ urge incontinence  - We discussed the pathophysiology of stress incontinence. We discussed Kegel exercises verus surgery vs bulking agents. Recommend she start Kegel exercises.  - We discussed pathophysiology of urge incontinence. We discussed behavioral management versus medication. She is interested in trying samples today.    - Prescribed Gemtesa 75 mg samples x 1 month  Return for cystoscopy / f/u renal ultrasound  I,Kailey Littlejohn,acting as a scribe for Hollice Espy, MD.,have documented all relevant documentation on the behalf of Hollice Espy, MD,as directed by  Hollice Espy, MD while in the  presence of Hollice Espy, MD.  I have reviewed the above documentation for accuracy and completeness, and I agree with the above.   Hollice Espy, MD   [facility] [address] [place], [place] [address] [phone]

## 2021-09-05 ENCOUNTER — Encounter: Payer: Self-pay | Admitting: Urology

## 2021-09-05 ENCOUNTER — Other Ambulatory Visit: Payer: Self-pay

## 2021-09-05 ENCOUNTER — Telehealth: Payer: Self-pay

## 2021-09-05 ENCOUNTER — Ambulatory Visit: Payer: PPO | Admitting: Urology

## 2021-09-05 VITALS — BP 119/76 | HR 90 | Ht 64.0 in | Wt 160.0 lb

## 2021-09-05 DIAGNOSIS — R3129 Other microscopic hematuria: Secondary | ICD-10-CM | POA: Diagnosis not present

## 2021-09-05 DIAGNOSIS — N329 Bladder disorder, unspecified: Secondary | ICD-10-CM

## 2021-09-05 MED ORDER — GEMTESA 75 MG PO TABS: 75.0000 mg | ORAL_TABLET | Freq: Every day | ORAL | 0 refills | Status: DC

## 2021-09-05 NOTE — Patient Instructions (Signed)

## 2021-09-05 NOTE — Telephone Encounter (Signed)
There was not enough urine to obtain a urine cytology. Would you like me to call the patient in sooner or get it at her cystoscopy visit in 4 weeks?

## 2021-09-06 LAB — URINALYSIS, COMPLETE
Bilirubin, UA: NEGATIVE
Glucose, UA: NEGATIVE
Nitrite, UA: NEGATIVE
Protein,UA: NEGATIVE
Specific Gravity, UA: 1.02 (ref 1.005–1.030)
Urobilinogen, Ur: 0.2 mg/dL (ref 0.2–1.0)
pH, UA: 5.5 (ref 5.0–7.5)

## 2021-09-06 LAB — MICROSCOPIC EXAMINATION

## 2021-09-15 ENCOUNTER — Other Ambulatory Visit: Payer: Self-pay

## 2021-09-15 ENCOUNTER — Ambulatory Visit
Admission: RE | Admit: 2021-09-15 | Discharge: 2021-09-15 | Disposition: A | Discharge status: Home / Self Care | Payer: PPO | Source: Ambulatory Visit | Attending: Urology | Admitting: Urology

## 2021-09-15 DIAGNOSIS — R3129 Other microscopic hematuria: Secondary | ICD-10-CM | POA: Insufficient documentation

## 2021-09-15 DIAGNOSIS — N2889 Other specified disorders of kidney and ureter: Secondary | ICD-10-CM | POA: Diagnosis not present

## 2021-09-18 ENCOUNTER — Encounter: Payer: Self-pay | Admitting: Physician Assistant

## 2021-09-18 ENCOUNTER — Telehealth: Payer: Self-pay

## 2021-09-18 ENCOUNTER — Ambulatory Visit: Payer: PPO | Admitting: Physician Assistant

## 2021-09-18 ENCOUNTER — Other Ambulatory Visit: Payer: Self-pay

## 2021-09-18 DIAGNOSIS — F17210 Nicotine dependence, cigarettes, uncomplicated: Secondary | ICD-10-CM

## 2021-09-18 DIAGNOSIS — J449 Chronic obstructive pulmonary disease, unspecified: Secondary | ICD-10-CM | POA: Diagnosis not present

## 2021-09-18 DIAGNOSIS — R0602 Shortness of breath: Secondary | ICD-10-CM | POA: Diagnosis not present

## 2021-09-18 NOTE — Progress Notes (Signed)
[facility] [place], [place] [address]  Pulmonary Sleep Medicine   Office Visit Note  Patient Name: Wendy Garza DOB: [birth year] MRN [id number]  Date of Service: 09/24/2021  Complaints/HPI: Pt is here for routine pulmonary follow up. Smoking 4-5 cigs per day. Arlyce Harman showed FEV1of 1.5L, 68%, which is consistent with mild COPD and is slightly worse from last PFT.Continues to use symbicort daily and has albuterol if needed, though not usually needed. Overall breathing well controlled. Due for CT chest for lung cancer screening due to prolonged smoking history and current use. Will need updated PFT next visit.  ROS  General: (-) fever, (-) chills, (-) night sweats, (-) weakness Skin: (-) rashes, (-) itching,. Eyes: (-) visual changes, (-) redness, (-) itching. Nose and Sinuses: (-) nasal stuffiness or itchiness, (-) postnasal drip, (-) nosebleeds, (-) sinus trouble. Mouth and Throat: (-) sore throat, (-) hoarseness. Neck: (-) swollen glands, (-) enlarged thyroid, (-) neck pain. Respiratory: - cough, (-) bloody sputum, - shortness of breath, + wheezing. Cardiovascular: - ankle swelling, (-) chest pain. Lymphatic: (-) lymph node enlargement. Neurologic: (-) numbness, (-) tingling. Psychiatric: (-) anxiety, (-) depression   Current Medication: Outpatient Encounter Medications as of 09/18/2021  Medication Sig   albuterol (VENTOLIN HFA) 108 (90 Base) MCG/ACT inhaler Inhale 2 puffs into the lungs every 6 (six) hours as needed for wheezing or shortness of breath. Reported on 11/10/2015   amLODipine (NORVASC) 2.5 MG tablet TAKE 1 TABLET BY MOUTH EVERY DAY   budesonide-formoterol (SYMBICORT) 80-4.5 MCG/ACT inhaler Inhale 2 puffs into the lungs 2 (two) times daily.   ibuprofen (ADVIL) 200 MG tablet Take 600 mg by mouth every 8 (eight) hours as needed (for pain.).   levothyroxine (SYNTHROID) 50 MCG tablet TAKE 1 TABLET BY MOUTH EVERY DAY BEFORE BREAKFAST   metoprolol  tartrate (LOPRESSOR) 50 MG tablet TAKE 1/2 OF A TABLET BY MOUTH TWICE A DAY   rosuvastatin (CRESTOR) 20 MG tablet TAKE 1 TABLET BY MOUTH DAILY FOR HIGH CHOLESTEROL   sertraline (ZOLOFT) 50 MG tablet TAKE 1 TABLET BY MOUTH EVERY DAY   Vibegron (GEMTESA) 75 MG TABS Take 75 mg by mouth daily.   No facility-administered encounter medications on file as of 09/18/2021.    Surgical History: Past Surgical History:  Procedure Laterality Date   ABDOMINAL HYSTERECTOMY     BREAST SURGERY     BIOPSY   CATARACT EXTRACTION W/PHACO Left 10/04/2015   Procedure: CATARACT EXTRACTION PHACO AND INTRAOCULAR LENS PLACEMENT ([facility]);  Surgeon: Birder Robson, MD;  Location: [facility] ORS;  Service: Ophthalmology;  Laterality: Left;  [place]: 00:46.8 AP%: 21.7 CDE: 10.19 Lot # [id number]   CATARACT EXTRACTION W/PHACO Right 10/25/2015   Procedure: CATARACT EXTRACTION PHACO AND INTRAOCULAR LENS PLACEMENT ([place]);  Surgeon: Birder Robson, MD;  Location: [facility] ORS;  Service: Ophthalmology;  Laterality: Right;  [place] 00:48 AP% 25.2 CDE 12.16 fluid pack lot # [id number]   COLONOSCOPY WITH PROPOFOL N/A 05/28/2016   Procedure: COLONOSCOPY WITH PROPOFOL;  Surgeon: Manya Silvas, MD;  Location: [facility] ENDOSCOPY;  Service: Endoscopy;  Laterality: N/A;   COLONOSCOPY WITH PROPOFOL N/A 07/03/2021   Procedure: COLONOSCOPY WITH PROPOFOL;  Surgeon: Lin Landsman, MD;  Location: [facility] ENDOSCOPY;  Service: Gastroenterology;  Laterality: N/A;   CYSTOSCOPY WITH BIOPSY N/A 08/31/2019   Procedure: CYSTOSCOPY WITH bladder BIOPSY;  Surgeon: Hollice Espy, MD;  Location: [facility] ORS;  Service: Urology;  Laterality: N/A;   CYSTOSCOPY WITH FULGERATION N/A 08/31/2019   Procedure: CYSTOSCOPY WITH FULGERATION;  Surgeon: Hollice Espy, MD;  Location: [facility] ORS;  Service: Urology;  Laterality: N/A;    Medical History: Past Medical History:  Diagnosis Date   Arthritis    Asthma    Cancer ([place]) 2003   UTERINE, total Hysterectomy   COPD (chronic obstructive  pulmonary disease) (HCC)    Depression    Hyperlipidemia    Hypertension    Shortness of breath dyspnea    DOE    Family History: Family History  Problem Relation Age of Onset   Kidney disease Mother    Kidney cancer Neg Hx    Prostate cancer Neg Hx     Social History: Social History   Socioeconomic History   Marital status: Single    Spouse name: Not on file   Number of children: Not on file   Years of education: Not on file   Highest education level: Not on file  Occupational History   Not on file  Tobacco Use   Smoking status: Every Day    Years: 25.00    Types: Cigarettes   Smokeless tobacco: Never   Tobacco comments:    3 or 4 cigarettes a day-reported on 11/14/20  Vaping Use   Vaping Use: Never used  Substance and Sexual Activity   Alcohol use: Yes    Comment: rarely   Drug use: No   Sexual activity: Not on file  Other Topics Concern   Not on file  Social History Narrative   Not on file   Social Determinants of Health   Financial Resource Strain: Low Risk    Difficulty of Paying Living Expenses: Not very hard  Food Insecurity: Not on file  Transportation Needs: Not on file  Physical Activity: Not on file  Stress: Not on file  Social Connections: Not on file  Intimate Partner Violence: Not on file    Vital Signs: Blood pressure 134/78, pulse 80, temperature 98.4 F (36.9 C), resp. rate 16, height 5\' 4"  (1.626 m), weight 165 lb (74.8 kg), SpO2 97 %.  Examination: General Appearance: The patient is well-developed, well-nourished, and in no distress. Skin: Gross inspection of skin unremarkable. Head: normocephalic, no gross deformities. Eyes: no gross deformities noted. ENT: ears appear grossly normal no exudates. Neck: Supple. No thyromegaly. No LAD. Respiratory: Lungs are clear to auscultation bilaterally. Cardiovascular: Normal S1 and S2 without murmur or rub. Extremities: No cyanosis. pulses are equal. Neurologic: Alert and oriented. No  involuntary movements.  LABS: Recent Results (from the past 2160 hour(s))  Urinalysis, Complete     Status: Abnormal   Collection Time: 09/05/21  1:06 PM  Result Value Ref Range   Specific Gravity, UA 1.020 1.005 - 1.030   pH, UA 5.5 5.0 - 7.5   Color, UA Yellow Yellow   Appearance Ur Clear Clear   Leukocytes,UA Trace (A) Negative   Protein,UA Negative Negative/Trace   Glucose, UA Negative Negative   Ketones, UA Trace (A) Negative   RBC, UA 2+ (A) Negative   Bilirubin, UA Negative Negative   Urobilinogen, Ur 0.2 0.2 - 1.0 mg/dL   Nitrite, UA Negative Negative   Microscopic Examination See below:   Microscopic Examination     Status: Abnormal   Collection Time: 09/05/21  1:06 PM   Urine  Result Value Ref Range   WBC, UA 0-5 0 - 5 /hpf   RBC 3-10 (A) 0 - 2 /hpf   Epithelial Cells (non renal) 0-10 0 - 10 /hpf   Mucus, UA Present (A) Not  Estab.   Bacteria, UA Few (A) None seen/Few    Radiology: Ultrasound renal complete  Result Date: 09/16/2021 CLINICAL DATA:  Microscopic hematuria EXAM: RENAL / URINARY TRACT ULTRASOUND COMPLETE COMPARISON:  CT abdomen done on 07/09/2019 FINDINGS: Right Kidney: Renal measurements: 9.2 x 3.9 x 4.8 cm = volume: 90.3 mL. There is no hydronephrosis. There is slightly increased echogenicity in the renal cortex which may be a technical artifact. There are no demonstrable renal stones. Left Kidney: Renal measurements: 10.8 x 4.6 x 5.4 cm = volume: 137.9 mL. There is no hydronephrosis or focal cortical thinning. Subtle increased echogenicity in the renal cortex may be a technical artifact. Possibility of medical renal disease is not excluded. There are no demonstrable renal stones. Bladder: Appears normal for degree of bladder distention. Other: None. IMPRESSION: There is no hydronephrosis or definite focal mass lesions in the kidneys. There is slightly increased cortical echogenicity in the kidneys which may be a technical artifact or suggest medical renal  disease. Electronically Signed   By: Elmer Picker M.D.   On: 09/16/2021 18:41    No results found.  Ultrasound renal complete  Result Date: 09/16/2021 CLINICAL DATA:  Microscopic hematuria EXAM: RENAL / URINARY TRACT ULTRASOUND COMPLETE COMPARISON:  CT abdomen done on 07/09/2019 FINDINGS: Right Kidney: Renal measurements: 9.2 x 3.9 x 4.8 cm = volume: 90.3 mL. There is no hydronephrosis. There is slightly increased echogenicity in the renal cortex which may be a technical artifact. There are no demonstrable renal stones. Left Kidney: Renal measurements: 10.8 x 4.6 x 5.4 cm = volume: 137.9 mL. There is no hydronephrosis or focal cortical thinning. Subtle increased echogenicity in the renal cortex may be a technical artifact. Possibility of medical renal disease is not excluded. There are no demonstrable renal stones. Bladder: Appears normal for degree of bladder distention. Other: None. IMPRESSION: There is no hydronephrosis or definite focal mass lesions in the kidneys. There is slightly increased cortical echogenicity in the kidneys which may be a technical artifact or suggest medical renal disease. Electronically Signed   By: Elmer Picker M.D.   On: 09/16/2021 18:41      Assessment and Plan: Patient Active Problem List   Diagnosis Date Noted   History of colonic polyps    Subclinical hypothyroidism 01/20/2021   Prediabetes 01/20/2021   Cutaneous candidiasis 06/04/2020   Need for shingles vaccine 06/04/2020   Hematuria 11/18/2019   Atherosclerosis of aorta ([place]) 11/18/2019   Encounter for general adult medical examination with abnormal findings 05/31/2019   Benign essential tremor 05/31/2019   Need for vaccination against Streptococcus pneumoniae using pneumococcal conjugate vaccine 7 05/31/2019   Dysuria 05/31/2019   Encounter for hepatitis C screening test for low risk patient 05/31/2019   Screening for breast cancer 05/21/2018   Needs flu shot 05/21/2018   Obstructive  chronic bronchitis without exacerbation ([place]) 12/03/2017   Nicotine dependence, cigarettes, uncomplicated 16/05/9603   Mild intermittent asthma without complication 54/04/8118   Vitamin D deficiency 10/17/2017   Mixed hyperlipidemia 10/08/2017   Essential hypertension 10/08/2017   Generalized anxiety disorder 10/08/2017   SUI (stress urinary incontinence, female) 11/12/2015   Microscopic hematuria 11/12/2015   Urge incontinence 11/12/2015    1. Obstructive chronic bronchitis without exacerbation ([place]) Continue inhalers as prescribed and as indicated. Will update CT chest - CT Chest Wo Contrast; Future  2. Nicotine dependence, cigarettes, uncomplicated Will check CT chest due to current and prolonged smoking history - CT Chest Wo Contrast; Future  3. SOB (shortness  of breath) - Spirometry with Graph showed FEV1 of 1.5L, 65 % which is mild COPD and slightly reduced from prior PFT - CT Chest Wo Contrast; Future   General Counseling: I have discussed the findings of the evaluation and examination with Ariani.  I have also discussed any further diagnostic evaluation thatmay be needed or ordered today. Ulah verbalizes understanding of the findings of todays visit. We also reviewed her medications today and discussed drug interactions and side effects including but not limited excessive drowsiness and altered mental states. We also discussed that there is always a risk not just to her but also people around her. she has been encouraged to call the office with any questions or concerns that should arise related to todays visit.  Orders Placed This Encounter  Procedures   CT Chest Wo Contrast    Standing Status:   Future    Standing Expiration Date:   09/18/2022    Order Specific Question:   Preferred imaging location?    Answer:   [facility]   Spirometry with Graph    Order Specific Question:   Where should this test be performed?    Answer:   [facility]    Order  Specific Question:   Basic spirometry    Answer:   Yes     Time spent: 30  I have personally obtained a history, examined the patient, evaluated laboratory and imaging results, formulated the assessment and plan and placed orders. This patient was seen by Drema Dallas, PA-C in collaboration with Dr. Devona Konig as a part of collaborative care agreement.     Allyne Gee, MD [facility] Pulmonary and Critical Care Sleep medicine

## 2021-09-18 NOTE — Telephone Encounter (Signed)
Chest CT ordered. Printed. Put on Titania's desk-Toni

## 2021-09-26 ENCOUNTER — Other Ambulatory Visit: Payer: Self-pay | Admitting: Internal Medicine

## 2021-09-26 DIAGNOSIS — E038 Other specified hypothyroidism: Secondary | ICD-10-CM

## 2021-09-29 ENCOUNTER — Other Ambulatory Visit: Payer: Self-pay | Admitting: Nurse Practitioner

## 2021-09-29 DIAGNOSIS — I10 Essential (primary) hypertension: Secondary | ICD-10-CM

## 2021-10-02 NOTE — Progress Notes (Signed)
° °  10/03/21  CC:  Chief Complaint  Patient presents with   Cysto    HPI: Wendy Garza is a 76 y.o.female with a personal history of microscopic hematuria, stress incontinence/ urge incontinence and bladder lesion   She had negative work-up in 2017 for hematuria.   Cystoscopy on 07/21/2019 noted a very small lesion involving the dome of the bladder. She is s/p bladder biopsy on 08/31/2019 that was negative for atypia and malignancy.   RUS on 09/15/2021 visualized no hydronephrosis or definite focal mass lesions in the kidneys. There is slightly increased cortical echogenicity in the kidneys.   She has 3-10 RBCs in urine today.   Vitals:   10/03/21 1403  BP: 132/77  Pulse: 84   NED. A&Ox3.   No respiratory distress   Abd soft, NT, ND Normal external genitalia with patent urethral meatus  Cystoscopy Procedure Note  Patient identification was confirmed, informed consent was obtained, and patient was prepped using Betadine solution.  Lidocaine jelly was administered per urethral meatus.    Procedure: - Flexible cystoscope introduced, without any difficulty.   - Thorough search of the bladder revealed:    normal urethral meatus    normal urothelium    no stones    no ulcers     no tumors   Scar at dome measuring due to previous biopsy    small amount of inflammation around trigone     no urethral polyps    no trabeculation  - Ureteral orifices were normal in position and appearance.  Post-Procedure: - Patient tolerated the procedure well  Assessment/ Plan:  1.  Micrscopic hematuria - Stable microscopic hematuria with no acute worsening over the past year.  - Cystoscopy was unremarkable  - Continue Gemtesa 75 mg, improved frequency on this medication - Urine sent for cytology   F/u annually with UA  I,Kailey Littlejohn,acting as a scribe for Hollice Espy, MD.,have documented all relevant documentation on the behalf of Hollice Espy, MD,as directed by  Hollice Espy, MD while in the presence of Hollice Espy, MD.  I have reviewed the above documentation for accuracy and completeness, and I agree with the above.   Hollice Espy, MD

## 2021-10-03 ENCOUNTER — Other Ambulatory Visit: Payer: Self-pay

## 2021-10-03 ENCOUNTER — Ambulatory Visit: Payer: PPO | Admitting: Urology

## 2021-10-03 VITALS — BP 132/77 | HR 84 | Ht 64.0 in | Wt 163.0 lb

## 2021-10-03 DIAGNOSIS — N329 Bladder disorder, unspecified: Secondary | ICD-10-CM

## 2021-10-03 LAB — URINALYSIS, COMPLETE
Bilirubin, UA: NEGATIVE
Glucose, UA: NEGATIVE
Ketones, UA: NEGATIVE
Leukocytes,UA: NEGATIVE
Nitrite, UA: NEGATIVE
Protein,UA: NEGATIVE
Specific Gravity, UA: 1.02 (ref 1.005–1.030)
Urobilinogen, Ur: 0.2 mg/dL (ref 0.2–1.0)
pH, UA: 5.5 (ref 5.0–7.5)

## 2021-10-03 LAB — MICROSCOPIC EXAMINATION: Bacteria, UA: NONE SEEN

## 2021-10-03 MED ORDER — GEMTESA 75 MG PO TABS: 75.0000 mg | ORAL_TABLET | Freq: Every day | ORAL | 0 refills | Status: DC

## 2021-10-05 LAB — CYTOLOGY - NON PAP

## 2021-10-18 ENCOUNTER — Ambulatory Visit: Payer: PPO | Admitting: Internal Medicine

## 2021-10-25 ENCOUNTER — Other Ambulatory Visit: Payer: Self-pay | Admitting: Internal Medicine

## 2021-10-25 DIAGNOSIS — F411 Generalized anxiety disorder: Secondary | ICD-10-CM

## 2021-10-30 ENCOUNTER — Encounter: Payer: Self-pay | Admitting: Internal Medicine

## 2021-10-30 ENCOUNTER — Ambulatory Visit: Payer: PPO | Admitting: Internal Medicine

## 2021-10-30 ENCOUNTER — Other Ambulatory Visit: Payer: Self-pay

## 2021-10-30 ENCOUNTER — Telehealth: Payer: Self-pay

## 2021-10-30 VITALS — BP 140/80 | HR 73 | Temp 97.8°F | Resp 16 | Ht 64.0 in | Wt 162.0 lb

## 2021-10-30 DIAGNOSIS — F17210 Nicotine dependence, cigarettes, uncomplicated: Secondary | ICD-10-CM | POA: Diagnosis not present

## 2021-10-30 DIAGNOSIS — J449 Chronic obstructive pulmonary disease, unspecified: Secondary | ICD-10-CM | POA: Diagnosis not present

## 2021-10-30 DIAGNOSIS — R0602 Shortness of breath: Secondary | ICD-10-CM | POA: Diagnosis not present

## 2021-10-30 NOTE — Patient Instructions (Signed)

## 2021-10-30 NOTE — Telephone Encounter (Signed)
Routine chest CT ordered. Printed. Gave to Titania-Toni ?

## 2021-10-30 NOTE — Progress Notes (Signed)
[facility] [place], [place] [address]  Pulmonary Sleep Medicine   Office Visit Note  Patient Name: Wendy Garza DOB: 07/25/1946 MRN [id number]  Date of Service: 10/30/2021  Complaints/HPI: She states breathing is pretty good. Notes she has some cough and congestion. Notes it is in the mornings. She is smoking about 4-5 cigs per day. Really does not think about quitting alothough sometimes she does think about it. She does not SOB with exertion up a hill or steps. Denies having chest pain. No admissions to the hospital. She states vaccination is up to date including covid. On albuterol and symbicort which do help with her breating  ROS  General: (-) fever, (-) chills, (-) night sweats, (-) weakness Skin: (-) rashes, (-) itching,. Eyes: (-) visual changes, (-) redness, (-) itching. Nose and Sinuses: (-) nasal stuffiness or itchiness, (-) postnasal drip, (-) nosebleeds, (-) sinus trouble. Mouth and Throat: (-) sore throat, (-) hoarseness. Neck: (-) swollen glands, (-) enlarged thyroid, (-) neck pain. Respiratory: + cough, (-) bloody sputum, + shortness of breath, + wheezing. Cardiovascular: - ankle swelling, (-) chest pain. Lymphatic: (-) lymph node enlargement. Neurologic: (-) numbness, (-) tingling. Psychiatric: (-) anxiety, (-) depression   Current Medication: Outpatient Encounter Medications as of 10/30/2021  Medication Sig   albuterol (VENTOLIN HFA) 108 (90 Base) MCG/ACT inhaler Inhale 2 puffs into the lungs every 6 (six) hours as needed for wheezing or shortness of breath. Reported on 11/10/2015   amLODipine (NORVASC) 2.5 MG tablet TAKE 1 TABLET BY MOUTH EVERY DAY   budesonide-formoterol (SYMBICORT) 80-4.5 MCG/ACT inhaler Inhale 2 puffs into the lungs 2 (two) times daily.   ibuprofen (ADVIL) 200 MG tablet Take 600 mg by mouth every 8 (eight) hours as needed (for pain.).   levothyroxine (SYNTHROID) 50 MCG tablet TAKE 1 TABLET BY MOUTH EVERY DAY BEFORE  BREAKFAST   metoprolol tartrate (LOPRESSOR) 50 MG tablet TAKE 1/2 OF A TABLET BY MOUTH TWICE A DAY   rosuvastatin (CRESTOR) 20 MG tablet TAKE 1 TABLET BY MOUTH DAILY FOR HIGH CHOLESTEROL   sertraline (ZOLOFT) 50 MG tablet TAKE 1 TABLET BY MOUTH EVERY DAY   Vibegron (GEMTESA) 75 MG TABS Take 75 mg by mouth daily.   No facility-administered encounter medications on file as of 10/30/2021.    Surgical History: Past Surgical History:  Procedure Laterality Date   ABDOMINAL HYSTERECTOMY     BREAST SURGERY     BIOPSY   CATARACT EXTRACTION W/PHACO Left 10/04/2015   Procedure: CATARACT EXTRACTION PHACO AND INTRAOCULAR LENS PLACEMENT (IOC);  Surgeon: Birder Robson, MD;  Location: [facility] ORS;  Service: Ophthalmology;  Laterality: Left;  [place]: 00:46.8 AP%: 21.7 CDE: 10.19 Lot # [id number]   CATARACT EXTRACTION W/PHACO Right 10/25/2015   Procedure: CATARACT EXTRACTION PHACO AND INTRAOCULAR LENS PLACEMENT ([place]);  Surgeon: Birder Robson, MD;  Location: [facility] ORS;  Service: Ophthalmology;  Laterality: Right;  [place] 00:48 AP% 25.2 CDE 12.16 fluid pack lot # [id number]   COLONOSCOPY WITH PROPOFOL N/A 05/28/2016   Procedure: COLONOSCOPY WITH PROPOFOL;  Surgeon: Manya Silvas, MD;  Location: [facility] ENDOSCOPY;  Service: Endoscopy;  Laterality: N/A;   COLONOSCOPY WITH PROPOFOL N/A 07/03/2021   Procedure: COLONOSCOPY WITH PROPOFOL;  Surgeon: Lin Landsman, MD;  Location: [facility] ENDOSCOPY;  Service: Gastroenterology;  Laterality: N/A;   CYSTOSCOPY WITH BIOPSY N/A 08/31/2019   Procedure: CYSTOSCOPY WITH bladder BIOPSY;  Surgeon: Hollice Espy, MD;  Location: [facility] ORS;  Service: Urology;  Laterality: N/A;   [place]  N/A 08/31/2019   Procedure: CYSTOSCOPY WITH FULGERATION;  Surgeon: Hollice Espy, MD;  Location: [facility] ORS;  Service: Urology;  Laterality: N/A;    Medical History: Past Medical History:  Diagnosis Date   Arthritis    Asthma    Cancer ([place]) 2003   UTERINE, total Hysterectomy    COPD (chronic obstructive pulmonary disease) (HCC)    Depression    Hyperlipidemia    Hypertension    Shortness of breath dyspnea    DOE    Family History: Family History  Problem Relation Age of Onset   Kidney disease Mother    Kidney cancer Neg Hx    Prostate cancer Neg Hx     Social History: Social History   Socioeconomic History   Marital status: Single    Spouse name: Not on file   Number of children: Not on file   Years of education: Not on file   Highest education level: Not on file  Occupational History   Not on file  Tobacco Use   Smoking status: Every Day    Years: 25.00    Types: Cigarettes   Smokeless tobacco: Never   Tobacco comments:    3 or 4 cigarettes a day-reported on 11/14/20  Vaping Use   Vaping Use: Never used  Substance and Sexual Activity   Alcohol use: Yes    Comment: rarely   Drug use: No   Sexual activity: Not on file  Other Topics Concern   Not on file  Social History Narrative   Not on file   Social Determinants of Health   Financial Resource Strain: Low Risk    Difficulty of Paying Living Expenses: Not very hard  Food Insecurity: Not on file  Transportation Needs: Not on file  Physical Activity: Not on file  Stress: Not on file  Social Connections: Not on file  Intimate Partner Violence: Not on file    Vital Signs: Blood pressure 140/80, pulse 73, temperature 97.8 F (36.6 C), resp. rate 16, height '5\' 4"'$  (1.626 m), weight 162 lb (73.5 kg), SpO2 98 %.  Examination: General Appearance: The patient is well-developed, well-nourished, and in no distress. Skin: Gross inspection of skin unremarkable. Head: normocephalic, no gross deformities. Eyes: no gross deformities noted. ENT: ears appear grossly normal no exudates. Neck: Supple. No thyromegaly. No LAD. Respiratory: no rhonchi noted at this time. Cardiovascular: Normal S1 and S2 without murmur or rub. Extremities: No cyanosis. pulses are equal. Neurologic: Alert and  oriented. No involuntary movements.  LABS: Recent Results (from the past 2160 hour(s))  Urinalysis, Complete     Status: Abnormal   Collection Time: 09/05/21  1:06 PM  Result Value Ref Range   Specific Gravity, UA 1.020 1.005 - 1.030   pH, UA 5.5 5.0 - 7.5   Color, UA Yellow Yellow   Appearance Ur Clear Clear   Leukocytes,UA Trace (A) Negative   Protein,UA Negative Negative/Trace   Glucose, UA Negative Negative   Ketones, UA Trace (A) Negative   RBC, UA 2+ (A) Negative   Bilirubin, UA Negative Negative   Urobilinogen, Ur 0.2 0.2 - 1.0 mg/dL   Nitrite, UA Negative Negative   Microscopic Examination See below:   Microscopic Examination     Status: Abnormal   Collection Time: 09/05/21  1:06 PM   Urine  Result Value Ref Range   WBC, UA 0-5 0 - 5 /hpf   RBC 3-10 (A) 0 - 2 /hpf   Epithelial Cells (non renal) 0-10 0 -  10 /hpf   Mucus, UA Present (A) Not Estab.   Bacteria, UA Few (A) None seen/Few  Cytology - Non PAP;     Status: None   Collection Time: 10/03/21  1:37 PM  Result Value Ref Range   CYTOLOGY - NON GYN      CYTOLOGY - NON PAP CASE: [id number] PATIENT: Caleigha Korson Non-Gynecological Cytology Report     Specimen Submitted: A. Urine  Clinical History: Lesion of bladder N32.9 - Primary      DIAGNOSIS: A. URINE; BLADDER WASH: - NEGATIVE FOR HIGH GRADE UROTHELIAL CARCINOMA. - BENIGN UROTHELIAL CELLS.  Comment: The specimen is adequate for interpretation.  [place] for Reporting Urinary Cytology 2016.   GROSS DESCRIPTION: A. Labeled: Received labeled with the patient's name date of birth (per requisition urine, wash) Received: Fresh Volume: Approximately 30 mL Description of fluid and container in which it is received: Received in a clear plastic specimen container with a yellow screw top lid is yellow transparent fluid. Specimen material submitted for: ThinPrep  RB 10/04/2021  Final Diagnosis performed by Betsy Pries, MD.    Electronically signed 10/05/2021 1:59:29PM The electronic signature indicates that the named Attending Pathologist has e valuated the specimen Technical component performed at [facility], [address], [place], [place] [address] Lab: [phone] Dir: Rush Farmer, MD, MMM  Professional component performed at [facility], [facility], [place], [place], [place] [address] Lab: [phone] Dir: Kathi Simpers, MD   Urinalysis, Complete     Status: Abnormal   Collection Time: 10/03/21  1:42 PM  Result Value Ref Range   Specific Gravity, UA 1.020 1.005 - 1.030   pH, UA 5.5 5.0 - 7.5   Color, UA Yellow Yellow   Appearance Ur Clear Clear   Leukocytes,UA Negative Negative   Protein,UA Negative Negative/Trace   Glucose, UA Negative Negative   Ketones, UA Negative Negative   RBC, UA 3+ (A) Negative   Bilirubin, UA Negative Negative   Urobilinogen, Ur 0.2 0.2 - 1.0 mg/dL   Nitrite, UA Negative Negative   Microscopic Examination See below:   Microscopic Examination     Status: Abnormal   Collection Time: 10/03/21  1:42 PM   Urine  Result Value Ref Range   WBC, UA 0-5 0 - 5 /hpf   RBC 3-10 (A) 0 - 2 /hpf   Epithelial Cells (non renal) 0-10 0 - 10 /hpf   Casts Present (A) None seen /lpf   Cast Type Hyaline casts N/A   Bacteria, UA None seen None seen/Few    Radiology: Ultrasound renal complete  Result Date: 09/16/2021 CLINICAL DATA:  Microscopic hematuria EXAM: RENAL / URINARY TRACT ULTRASOUND COMPLETE COMPARISON:  CT abdomen done on 07/09/2019 FINDINGS: Right Kidney: Renal measurements: 9.2 x 3.9 x 4.8 cm = volume: 90.3 mL. There is no hydronephrosis. There is slightly increased echogenicity in the renal cortex which may be a technical artifact. There are no demonstrable renal stones. Left Kidney: Renal measurements: 10.8 x 4.6 x 5.4 cm = volume: 137.9 mL. There is no hydronephrosis or focal cortical thinning. Subtle increased echogenicity in the renal cortex may be a  technical artifact. Possibility of medical renal disease is not excluded. There are no demonstrable renal stones. Bladder: Appears normal for degree of bladder distention. Other: None. IMPRESSION: There is no hydronephrosis or definite focal mass lesions in the kidneys. There is slightly increased cortical echogenicity in the kidneys which may be a technical artifact or suggest medical renal disease. Electronically  Signed   By: Elmer Picker M.D.   On: 09/16/2021 18:41    No results found.  No results found.    Assessment and Plan: Patient Active Problem List   Diagnosis Date Noted   History of colonic polyps    Subclinical hypothyroidism 01/20/2021   Prediabetes 01/20/2021   Cutaneous candidiasis 06/04/2020   Need for shingles vaccine 06/04/2020   Hematuria 11/18/2019   Atherosclerosis of aorta ([place]) 11/18/2019   Encounter for general adult medical examination with abnormal findings 05/31/2019   Benign essential tremor 05/31/2019   Need for vaccination against Streptococcus pneumoniae using pneumococcal conjugate vaccine 7 05/31/2019   Dysuria 05/31/2019   Encounter for hepatitis C screening test for low risk patient 05/31/2019   Screening for breast cancer 05/21/2018   Needs flu shot 05/21/2018   Obstructive chronic bronchitis without exacerbation ([place]) 12/03/2017   Nicotine dependence, cigarettes, uncomplicated 19/41/7408   Mild intermittent asthma without complication 14/48/1856   Vitamin D deficiency 10/17/2017   Mixed hyperlipidemia 10/08/2017   Essential hypertension 10/08/2017   Generalized anxiety disorder 10/08/2017   SUI (stress urinary incontinence, female) 11/12/2015   Microscopic hematuria 11/12/2015   Urge incontinence 11/12/2015    1. Obstructive chronic bronchitis without exacerbation ([place]) Under control with her current regimen. Preventive measure for infections discussed  2. Nicotine dependence, cigarettes, uncomplicated Smoking cessation  instruction/counseling given:  counseled patient on the dangers of tobacco use, advised patient to stop smoking, and reviewed strategies to maximize success   3. SOB (shortness of breath) At her baseling responds to inhalers   General Counseling: I have discussed the findings of the evaluation and examination with Ke[place].  I have also discussed any further diagnostic evaluation thatmay be needed or ordered today. Stesha verbalizes understanding of the findings of todays visit. We also reviewed her medications today and discussed drug interactions and side effects including but not limited excessive drowsiness and altered mental states. We also discussed that there is always a risk not just to her but also people around her. she has been encouraged to call the office with any questions or concerns that should arise related to todays visit.  Orders Placed This Encounter  Procedures   CT CHEST LUNG CA SCREEN LOW DOSE W/O CM    Standing Status:   Future    Standing Expiration Date:   10/31/2022    Order Specific Question:   Preferred Imaging Location?    Answer:   [facility]     Time spent: 10  I have personally obtained a history, examined the patient, evaluated laboratory and imaging results, formulated the assessment and plan and placed orders.    Allyne Gee, MD [facility] Pulmonary and Critical Care Sleep medicine

## 2021-11-06 ENCOUNTER — Ambulatory Visit: Payer: PPO | Admitting: Internal Medicine

## 2021-11-09 ENCOUNTER — Telehealth: Payer: Self-pay

## 2021-11-09 NOTE — Telephone Encounter (Signed)
Patient scheduled for ct chest on 12/04/21 @ 10 kp/tat ? ? ?

## 2021-11-15 ENCOUNTER — Telehealth: Payer: PPO

## 2021-11-17 ENCOUNTER — Telehealth: Payer: PPO

## 2021-11-23 ENCOUNTER — Telehealth: Payer: Self-pay | Admitting: Urology

## 2021-11-23 NOTE — Telephone Encounter (Signed)
Patient called and states that Vibegron (GEMTESA) 75 MG TABS is too expensive and would like to have another medication called in to [place]. Please call patient when called in. ?

## 2021-11-23 NOTE — Telephone Encounter (Signed)
Left patient a vm to return my call ?

## 2021-11-29 MED ORDER — GEMTESA 75 MG PO TABS: 75.0000 mg | ORAL_TABLET | Freq: Every day | ORAL | 11 refills | Status: DC

## 2021-11-29 NOTE — Addendum Note (Signed)
Addended by: Verlene Mayer A on: 11/29/2021 05:03 PM ? ? Modules accepted: Orders ? ?

## 2021-11-29 NOTE — Telephone Encounter (Signed)
Prefers to send to [facility] to see if can get at a lower tier cost, will call back with any questions. Left samples for patient to pick up.  ?

## 2021-12-04 ENCOUNTER — Ambulatory Visit
Admission: RE | Admit: 2021-12-04 | Discharge: 2021-12-04 | Disposition: A | Discharge status: Home / Self Care | Payer: PPO | Source: Ambulatory Visit | Attending: Internal Medicine | Admitting: Internal Medicine

## 2021-12-04 DIAGNOSIS — F17210 Nicotine dependence, cigarettes, uncomplicated: Secondary | ICD-10-CM | POA: Diagnosis not present

## 2021-12-04 DIAGNOSIS — J449 Chronic obstructive pulmonary disease, unspecified: Secondary | ICD-10-CM | POA: Insufficient documentation

## 2021-12-06 ENCOUNTER — Telehealth: Payer: Self-pay

## 2021-12-06 NOTE — Telephone Encounter (Signed)
PA pending for Gemtesa on cover my meds ?

## 2021-12-13 ENCOUNTER — Other Ambulatory Visit: Payer: Self-pay

## 2021-12-13 ENCOUNTER — Telehealth: Payer: Self-pay | Admitting: Urology

## 2021-12-13 DIAGNOSIS — R0602 Shortness of breath: Secondary | ICD-10-CM

## 2021-12-13 NOTE — Telephone Encounter (Signed)
Please let Mr. Wendy Garza know that we received a letter from her insurance stating that they will not cover the [place], but they will cover Myrbetriq which is a similar medication.  If she would like to, we have samples of the Myrbetriq as well for her to try.  I can leave them at the front desk for her to pick up. ?

## 2021-12-14 ENCOUNTER — Encounter: Payer: Self-pay | Admitting: Nurse Practitioner

## 2021-12-14 ENCOUNTER — Ambulatory Visit (INDEPENDENT_AMBULATORY_CARE_PROVIDER_SITE_OTHER): Payer: PPO | Admitting: Nurse Practitioner

## 2021-12-14 VITALS — BP 126/74 | HR 80 | Temp 98.9°F | Resp 16 | Ht 64.0 in | Wt 159.8 lb

## 2021-12-14 DIAGNOSIS — I10 Essential (primary) hypertension: Secondary | ICD-10-CM

## 2021-12-14 DIAGNOSIS — J449 Chronic obstructive pulmonary disease, unspecified: Secondary | ICD-10-CM

## 2021-12-14 DIAGNOSIS — E782 Mixed hyperlipidemia: Secondary | ICD-10-CM

## 2021-12-14 DIAGNOSIS — E038 Other specified hypothyroidism: Secondary | ICD-10-CM | POA: Diagnosis not present

## 2021-12-14 DIAGNOSIS — R7303 Prediabetes: Secondary | ICD-10-CM | POA: Diagnosis not present

## 2021-12-14 MED ORDER — ALBUTEROL SULFATE HFA 108 (90 BASE) MCG/ACT IN AERS: 2.0000 | INHALATION_SPRAY | Freq: Four times a day (QID) | RESPIRATORY_TRACT | 5 refills | Status: DC | PRN

## 2021-12-14 MED ORDER — BUDESONIDE-FORMOTEROL FUMARATE 80-4.5 MCG/ACT IN AERO: 2.0000 | INHALATION_SPRAY | Freq: Two times a day (BID) | RESPIRATORY_TRACT | 3 refills | Status: DC

## 2021-12-14 MED ORDER — METOPROLOL TARTRATE 50 MG PO TABS: ORAL_TABLET | ORAL | 3 refills | Status: DC

## 2021-12-14 MED ORDER — ROSUVASTATIN CALCIUM 20 MG PO TABS: 20.0000 mg | ORAL_TABLET | Freq: Every day | ORAL | 1 refills | Status: DC

## 2021-12-14 MED ORDER — LEVOTHYROXINE SODIUM 50 MCG PO TABS: 50.0000 ug | ORAL_TABLET | Freq: Every day | ORAL | 1 refills | Status: DC

## 2021-12-14 NOTE — Progress Notes (Signed)
[facility] [place], [place] [address]  Internal MEDICINE  Office Visit Note  Patient Name: Wendy Garza  [id number]  [id number]  Date of Service: 12/14/2021  Chief Complaint  Patient presents with   Follow-up   Depression   Hyperlipidemia   Hypertension   Asthma   COPD   Medication Refill    HPI  Wendy Garza reports hypertension, asthma and COPD, and medication refills.  Her blood pressure remains well controlled on current medications.  Her other vital signs are also within normal limits.  She denies fevers or issues today.  Her respiratory status remains stable and she denies any shortness of breath with breathing recently.  She uses Symbicort and has reported that it is expensive sometimes depending on time of the year and if she is in the donut hole or not. Last A1c was 5.4 in May last year, will her A1c at her annual wellness visit in September    Current Medication: Outpatient Encounter Medications as of 12/14/2021  Medication Sig   amLODipine (NORVASC) 2.5 MG tablet TAKE 1 TABLET BY MOUTH EVERY DAY   ibuprofen (ADVIL) 200 MG tablet Take 600 mg by mouth every 8 (eight) hours as needed (for pain.).   sertraline (ZOLOFT) 50 MG tablet TAKE 1 TABLET BY MOUTH EVERY DAY   Vibegron (GEMTESA) 75 MG TABS Take 75 mg by mouth daily.   [DISCONTINUED] albuterol (VENTOLIN HFA) 108 (90 Base) MCG/ACT inhaler Inhale 2 puffs into the lungs every 6 (six) hours as needed for wheezing or shortness of breath. Reported on 11/10/2015   [DISCONTINUED] budesonide-formoterol (SYMBICORT) 80-4.5 MCG/ACT inhaler Inhale 2 puffs into the lungs 2 (two) times daily.   [DISCONTINUED] levothyroxine (SYNTHROID) 50 MCG tablet TAKE 1 TABLET BY MOUTH EVERY DAY BEFORE BREAKFAST   [DISCONTINUED] metoprolol tartrate (LOPRESSOR) 50 MG tablet TAKE 1/2 OF A TABLET BY MOUTH TWICE A DAY   [DISCONTINUED] rosuvastatin (CRESTOR) 20 MG tablet TAKE 1 TABLET BY MOUTH DAILY FOR HIGH CHOLESTEROL   albuterol  (VENTOLIN HFA) 108 (90 Base) MCG/ACT inhaler Inhale 2 puffs into the lungs every 6 (six) hours as needed for wheezing or shortness of breath. Reported on 11/10/2015   budesonide-formoterol (SYMBICORT) 80-4.5 MCG/ACT inhaler Inhale 2 puffs into the lungs 2 (two) times daily.   levothyroxine (SYNTHROID) 50 MCG tablet Take 1 tablet (50 mcg total) by mouth daily before breakfast.   metoprolol tartrate (LOPRESSOR) 50 MG tablet TAKE 1/2 OF A TABLET BY MOUTH TWICE A DAY   rosuvastatin (CRESTOR) 20 MG tablet Take 1 tablet (20 mg total) by mouth daily.   No facility-administered encounter medications on file as of 12/14/2021.    Surgical History: Past Surgical History:  Procedure Laterality Date   ABDOMINAL HYSTERECTOMY     BREAST SURGERY     BIOPSY   CATARACT EXTRACTION W/PHACO Left 10/04/2015   Procedure: CATARACT EXTRACTION PHACO AND INTRAOCULAR LENS PLACEMENT (IOC);  Surgeon: Birder Robson, MD;  Location: [facility] ORS;  Service: Ophthalmology;  Laterality: Left;  [place]: 00:46.8 AP%: 21.7 CDE: 10.19 Lot # [id number]   CATARACT EXTRACTION W/PHACO Right 10/25/2015   Procedure: CATARACT EXTRACTION PHACO AND INTRAOCULAR LENS PLACEMENT ([place]);  Surgeon: Birder Robson, MD;  Location: [facility] ORS;  Service: Ophthalmology;  Laterality: Right;  [place] 00:48 AP% 25.2 CDE 12.16 fluid pack lot # [id number]   COLONOSCOPY WITH PROPOFOL N/A 05/28/2016   Procedure: COLONOSCOPY WITH PROPOFOL;  Surgeon: Manya Silvas, MD;  Location: [facility] ENDOSCOPY;  Service: Endoscopy;  Laterality: N/A;  COLONOSCOPY WITH PROPOFOL N/A 07/03/2021   Procedure: COLONOSCOPY WITH PROPOFOL;  Surgeon: Lin Landsman, MD;  Location: [facility] ENDOSCOPY;  Service: Gastroenterology;  Laterality: N/A;   CYSTOSCOPY WITH BIOPSY N/A 08/31/2019   Procedure: CYSTOSCOPY WITH bladder BIOPSY;  Surgeon: Hollice Espy, MD;  Location: [facility] ORS;  Service: Urology;  Laterality: N/A;   CYSTOSCOPY WITH FULGERATION N/A 08/31/2019   Procedure: CYSTOSCOPY WITH  FULGERATION;  Surgeon: Hollice Espy, MD;  Location: [facility] ORS;  Service: Urology;  Laterality: N/A;    Medical History: Past Medical History:  Diagnosis Date   Arthritis    Asthma    Cancer ([place]) 2003   UTERINE, total Hysterectomy   COPD (chronic obstructive pulmonary disease) (HCC)    Depression    Hyperlipidemia    Hypertension    Shortness of breath dyspnea    DOE    Family History: Family History  Problem Relation Age of Onset   Kidney disease Mother    Kidney cancer Neg Hx    Prostate cancer Neg Hx     Social History   Socioeconomic History   Marital status: Single    Spouse name: Not on file   Number of children: Not on file   Years of education: Not on file   Highest education level: Not on file  Occupational History   Not on file  Tobacco Use   Smoking status: Every Day    Years: 25.00    Types: Cigarettes   Smokeless tobacco: Never   Tobacco comments:    3 or 4 cigarettes a day-reported on 11/14/20  Vaping Use   Vaping Use: Never used  Substance and Sexual Activity   Alcohol use: Yes    Comment: rarely   Drug use: No   Sexual activity: Not on file  Other Topics Concern   Not on file  Social History Narrative   Not on file   Social Determinants of Health   Financial Resource Strain: Low Risk    Difficulty of Paying Living Expenses: Not very hard  Food Insecurity: Not on file  Transportation Needs: Not on file  Physical Activity: Not on file  Stress: Not on file  Social Connections: Not on file  Intimate Partner Violence: Not on file      Review of Systems  Constitutional:  Negative for chills, fatigue and unexpected weight change.  HENT:  Negative for congestion, rhinorrhea, sneezing and sore throat.   Eyes:  Negative for redness.  Respiratory:  Negative for cough, chest tightness and shortness of breath.   Cardiovascular:  Negative for chest pain and palpitations.  Gastrointestinal:  Negative for abdominal pain, constipation,  diarrhea, nausea and vomiting.  Genitourinary:  Negative for dysuria and frequency.  Musculoskeletal:  Negative for arthralgias, back pain, joint swelling and neck pain.  Skin:  Negative for rash.  Neurological: Negative.  Negative for tremors and numbness.  Hematological:  Negative for adenopathy. Does not bruise/bleed easily.  Psychiatric/Behavioral:  Negative for behavioral problems (Depression), sleep disturbance and suicidal ideas. The patient is not nervous/anxious.    Vital Signs: BP 126/74   Pulse 80   Temp 98.9 F (37.2 C)   Resp 16   Ht '5\' 4"'$  (1.626 m)   Wt 159 lb 12.8 oz (72.5 kg)   LMP  (LMP Unknown)   SpO2 98%   BMI 27.43 kg/m    Physical Exam Vitals reviewed.  Constitutional:      General: She is not in acute distress.    Appearance: Normal  appearance. She is normal weight. She is not ill-appearing.  HENT:     Head: Normocephalic and atraumatic.  Eyes:     Pupils: Pupils are equal, round, and reactive to light.  Cardiovascular:     Rate and Rhythm: Normal rate and regular rhythm.  Pulmonary:     Effort: Pulmonary effort is normal. No respiratory distress.  Neurological:     Mental Status: She is alert and oriented to person, place, and time.  Psychiatric:        Mood and Affect: Mood normal.        Behavior: Behavior normal.       Assessment/Plan: 1. Essential hypertension Blood pressures well controlled on current medications, no changes continue as prescribed refills ordered. - metoprolol tartrate (LOPRESSOR) 50 MG tablet; TAKE 1/2 OF A TABLET BY MOUTH TWICE A DAY  Dispense: 90 tablet; Refill: 3  2. Obstructive chronic bronchitis without exacerbation (HCC) Stable, refills ordered.  Patient will notify the clinic if the Symbicort is too expensive and she needs to try different inhaler. - albuterol (VENTOLIN HFA) 108 (90 Base) MCG/ACT inhaler; Inhale 2 puffs into the lungs every 6 (six) hours as needed for wheezing or shortness of breath. Reported on  11/10/2015  Dispense: 18 g; Refill: 5 - budesonide-formoterol (SYMBICORT) 80-4.5 MCG/ACT inhaler; Inhale 2 puffs into the lungs 2 (two) times daily.  Dispense: 30.6 each; Refill: 3  3. Subclinical hypothyroidism Stable, refills ordered. - levothyroxine (SYNTHROID) 50 MCG tablet; Take 1 tablet (50 mcg total) by mouth daily before breakfast.  Dispense: 90 tablet; Refill: 1  4. Mixed hyperlipidemia Stable, refills ordered. - rosuvastatin (CRESTOR) 20 MG tablet; Take 1 tablet (20 mg total) by mouth daily.  Dispense: 90 tablet; Refill: 1  5. Prediabetes Repeat A1c in September.  Last 1 was normal at 5.4 in May 2022.   General Counseling: nieshia larmon understanding of the findings of todays visit and agrees with plan of treatment. I have discussed any further diagnostic evaluation that may be needed or ordered today. We also reviewed her medications today. she has been encouraged to call the office with any questions or concerns that should arise related to todays visit.    No orders of the defined types were placed in this encounter.   Meds ordered this encounter  Medications   albuterol (VENTOLIN HFA) 108 (90 Base) MCG/ACT inhaler    Sig: Inhale 2 puffs into the lungs every 6 (six) hours as needed for wheezing or shortness of breath. Reported on 11/10/2015    Dispense:  18 g    Refill:  5   budesonide-formoterol (SYMBICORT) 80-4.5 MCG/ACT inhaler    Sig: Inhale 2 puffs into the lungs 2 (two) times daily.    Dispense:  30.6 each    Refill:  3    Please give patient brand name or generic, whichever her insurance prefers.   rosuvastatin (CRESTOR) 20 MG tablet    Sig: Take 1 tablet (20 mg total) by mouth daily.    Dispense:  90 tablet    Refill:  1   metoprolol tartrate (LOPRESSOR) 50 MG tablet    Sig: TAKE 1/2 OF A TABLET BY MOUTH TWICE A DAY    Dispense:  90 tablet    Refill:  3   levothyroxine (SYNTHROID) 50 MCG tablet    Sig: Take 1 tablet (50 mcg total) by mouth daily before  breakfast.    Dispense:  90 tablet    Refill:  1    DX  Code Needed  E03.9. for future refills when due    Return for no follow up needed until her AWV in september.   Total time spent:30 Minutes Time spent includes review of chart, medications, test results, and follow up plan with the patient.   [place] Controlled Substance Database was reviewed by me.  This patient was seen by [name], FNP-C in collaboration with Dr. Clayborn Bigness as a part of collaborative care agreement.   Brigitta Pricer R. Valetta Fuller, MSN, FNP-C Internal medicine

## 2021-12-14 NOTE — Telephone Encounter (Signed)
.  left message to have patient return my call.  

## 2021-12-15 NOTE — Telephone Encounter (Signed)
Ok samples left at front ?

## 2021-12-15 NOTE — Telephone Encounter (Signed)
Spoke with patient and she would like myrbetriq, what dose? ?

## 2021-12-27 ENCOUNTER — Ambulatory Visit (INDEPENDENT_AMBULATORY_CARE_PROVIDER_SITE_OTHER): Payer: PPO | Admitting: Internal Medicine

## 2021-12-27 DIAGNOSIS — R0602 Shortness of breath: Secondary | ICD-10-CM

## 2022-01-04 ENCOUNTER — Encounter: Payer: Self-pay | Admitting: Nurse Practitioner

## 2022-01-04 ENCOUNTER — Ambulatory Visit: Payer: PPO | Admitting: Nurse Practitioner

## 2022-01-04 VITALS — BP 110/72 | HR 81 | Temp 98.2°F | Resp 16 | Ht 64.0 in | Wt 161.0 lb

## 2022-01-04 DIAGNOSIS — J449 Chronic obstructive pulmonary disease, unspecified: Secondary | ICD-10-CM

## 2022-01-04 DIAGNOSIS — I700 Atherosclerosis of aorta: Secondary | ICD-10-CM

## 2022-01-04 DIAGNOSIS — F17210 Nicotine dependence, cigarettes, uncomplicated: Secondary | ICD-10-CM

## 2022-01-04 NOTE — Progress Notes (Signed)
[facility] [place], [place] [address]  Internal MEDICINE  Office Visit Note  Patient Name: Wendy Garza  [id number]  [id number]  Date of Service: 01/04/2022  Chief Complaint  Patient presents with  . Follow-up  . COPD  . Asthma  . Results    HPI Wendy Garza presents for a follow-up visit for  Pft and CT scan results PFT results: FEV1 is mildly decreased, FEV1/FVC ratio is mildly decreased. No significant imrpovement with bronchodilator in FEV1. Total lung capacity, residual volume  CT results: Lungs/Pleura: Central airways are patent. Mild paraseptal and centrilobular emphysema. Upper lobe predominant subpleural interstitial opacities, likely smoking related interstitial changes. No consolidation, pleural effusion or pneumothorax. Right lung apex solid nodules, likely pleuroparenchymal scarring. Reference nodule measures 5.4 mm on image 35.  IMPRESSION: 1. Lung-RADS 2S, benign appearance or behavior. Continue annual screening with low-dose chest CT without contrast in 12 months. S modifier for coronary artery calcifications. 2. Coronary artery calcifications of the LAD and RCA, recommend ASCVD risk assessment. 3. Aortic Atherosclerosis (ICD10-I70.0) and Emphysema (ICD10-J43.9).   Current Medication: Outpatient Encounter Medications as of 01/04/2022  Medication Sig  . albuterol (VENTOLIN HFA) 108 (90 Base) MCG/ACT inhaler Inhale 2 puffs into the lungs every 6 (six) hours as needed for wheezing or shortness of breath. Reported on 11/10/2015  . amLODipine (NORVASC) 2.5 MG tablet TAKE 1 TABLET BY MOUTH EVERY DAY  . budesonide-formoterol (SYMBICORT) 80-4.5 MCG/ACT inhaler Inhale 2 puffs into the lungs 2 (two) times daily.  Marland Kitchen ibuprofen (ADVIL) 200 MG tablet Take 600 mg by mouth every 8 (eight) hours as needed (for pain.).  Marland Kitchen levothyroxine (SYNTHROID) 50 MCG tablet Take 1 tablet (50 mcg total) by mouth daily before breakfast.  . metoprolol tartrate  (LOPRESSOR) 50 MG tablet TAKE 1/2 OF A TABLET BY MOUTH TWICE A DAY  . rosuvastatin (CRESTOR) 20 MG tablet Take 1 tablet (20 mg total) by mouth daily.  . sertraline (ZOLOFT) 50 MG tablet TAKE 1 TABLET BY MOUTH EVERY DAY  . Vibegron (GEMTESA) 75 MG TABS Take 75 mg by mouth daily.   No facility-administered encounter medications on file as of 01/04/2022.    Surgical History: Past Surgical History:  Procedure Laterality Date  . ABDOMINAL HYSTERECTOMY    . BREAST SURGERY     BIOPSY  . CATARACT EXTRACTION W/PHACO Left 10/04/2015   Procedure: CATARACT EXTRACTION PHACO AND INTRAOCULAR LENS PLACEMENT (IOC);  Surgeon: Birder Robson, MD;  Location: [facility] ORS;  Service: Ophthalmology;  Laterality: Left;  [place]: 00:46.8 AP%: 21.7 CDE: 10.19 Lot # [id number]  . CATARACT EXTRACTION W/PHACO Right 10/25/2015   Procedure: CATARACT EXTRACTION PHACO AND INTRAOCULAR LENS PLACEMENT ([place]);  Surgeon: Birder Robson, MD;  Location: [facility] ORS;  Service: Ophthalmology;  Laterality: Right;  [place] 00:48 AP% 25.2 CDE 12.16 fluid pack lot # [id number]  . COLONOSCOPY WITH PROPOFOL N/A 05/28/2016   Procedure: COLONOSCOPY WITH PROPOFOL;  Surgeon: Manya Silvas, MD;  Location: [facility] ENDOSCOPY;  Service: Endoscopy;  Laterality: N/A;  . COLONOSCOPY WITH PROPOFOL N/A 07/03/2021   Procedure: COLONOSCOPY WITH PROPOFOL;  Surgeon: Lin Landsman, MD;  Location: [facility] ENDOSCOPY;  Service: Gastroenterology;  Laterality: N/A;  . CYSTOSCOPY WITH BIOPSY N/A 08/31/2019   Procedure: CYSTOSCOPY WITH bladder BIOPSY;  Surgeon: Hollice Espy, MD;  Location: [facility] ORS;  Service: Urology;  Laterality: N/A;  . CYSTOSCOPY WITH FULGERATION N/A 08/31/2019   Procedure: CYSTOSCOPY WITH FULGERATION;  Surgeon: Hollice Espy, MD;  Location: [facility] ORS;  Service: Urology;  Laterality: N/A;  Medical History: Past Medical History:  Diagnosis Date  . Arthritis   . Asthma   . Cancer [facility]) 2003   UTERINE, total Hysterectomy  . COPD (chronic obstructive  pulmonary disease) ([place])   . Depression   . Hyperlipidemia   . Hypertension   . Shortness of breath dyspnea    DOE    Family History: Family History  Problem Relation Age of Onset  . Kidney disease Mother   . Kidney cancer Neg Hx   . Prostate cancer Neg Hx     Social History   Socioeconomic History  . Marital status: Single    Spouse name: Not on file  . Number of children: Not on file  . Years of education: Not on file  . Highest education level: Not on file  Occupational History  . Not on file  Tobacco Use  . Smoking status: Every Day    Years: 25.00    Types: Cigarettes  . Smokeless tobacco: Never  . Tobacco comments:    3 or 4 cigarettes a day-reported on 11/14/20  Vaping Use  . Vaping Use: Never used  Substance and Sexual Activity  . Alcohol use: Yes    Comment: rarely  . Drug use: No  . Sexual activity: Not on file  Other Topics Concern  . Not on file  Social History Narrative  . Not on file   Social Determinants of Health   Financial Resource Strain: Low Risk   . Difficulty of Paying Living Expenses: Not very hard  Food Insecurity: Not on file  Transportation Needs: Not on file  Physical Activity: Not on file  Stress: Not on file  Social Connections: Not on file  Intimate Partner Violence: Not on file      Review of Systems  Vital Signs: BP 110/72   Pulse 81   Temp 98.2 F (36.8 C)   Resp 16   Ht '5\' 4"'$  (1.626 m)   Wt 161 lb (73 kg)   LMP  (LMP Unknown)   SpO2 97%   BMI 27.64 kg/m    Physical Exam     Assessment/Plan:   General Counseling: [name] verbalizes understanding of the findings of todays visit and agrees with plan of treatment. I have discussed any further diagnostic evaluation that may be needed or ordered today. We also reviewed her medications today. she has been encouraged to call the office with any questions or concerns that should arise related to todays visit.    No orders of the defined types were placed in  this encounter.   No orders of the defined types were placed in this encounter.   No follow-ups on file.   Total time spent:*** Minutes Time spent includes review of chart, medications, test results, and follow up plan with the patient.   [place] Controlled Substance Database was reviewed by me.  This patient was seen by [name], FNP-C in collaboration with Dr. Clayborn Bigness as a part of collaborative care agreement.   Kadin Bera R. Valetta Fuller, MSN, FNP-C Internal medicine

## 2022-01-14 ENCOUNTER — Encounter: Payer: Self-pay | Admitting: Nurse Practitioner

## 2022-01-17 NOTE — Procedures (Signed)
[facility] [address] [place], [address]    Complete Pulmonary Function Testing Interpretation:  FINDINGS:  Forced vital capacity is mildly decreased.  FEV1 is 1.44 L which is 68% of predicted and is mildly decreased.  FEV1 FVC ratio was mildly decreased.  Postbronchodilator there is no significant change in FEV1 however clinical improvement may occur in the absence of spirometric improvement.  Total lung capacity is mildly decreased.  Residual volume is decreased.  Residual on total lung capacity ratio is decreased.  FRC was decreased.  DLCO mildly decreased.  IMPRESSION:  This pulmonary function study is suggestive of mild obstructive and mild restrictive lung disease.  Medical correlation is recommended  Allyne Gee, MD [facility] Pulmonary Critical Care Medicine Sleep Medicine

## 2022-01-18 ENCOUNTER — Encounter: Payer: Self-pay | Admitting: Internal Medicine

## 2022-01-18 LAB — PULMONARY FUNCTION TEST

## 2022-03-04 ENCOUNTER — Encounter: Payer: Self-pay | Admitting: Nurse Practitioner

## 2022-03-27 ENCOUNTER — Other Ambulatory Visit: Payer: Self-pay | Admitting: *Deleted

## 2022-03-27 MED ORDER — MIRABEGRON ER 25 MG PO TB24: 25.0000 mg | ORAL_TABLET | Freq: Every day | ORAL | 12 refills | Status: DC

## 2022-03-31 ENCOUNTER — Other Ambulatory Visit: Payer: Self-pay | Admitting: Nurse Practitioner

## 2022-03-31 DIAGNOSIS — I10 Essential (primary) hypertension: Secondary | ICD-10-CM

## 2022-04-26 ENCOUNTER — Other Ambulatory Visit: Payer: Self-pay | Admitting: Nurse Practitioner

## 2022-04-26 DIAGNOSIS — F411 Generalized anxiety disorder: Secondary | ICD-10-CM

## 2022-05-03 ENCOUNTER — Ambulatory Visit (INDEPENDENT_AMBULATORY_CARE_PROVIDER_SITE_OTHER): Payer: PPO | Admitting: Internal Medicine

## 2022-05-03 ENCOUNTER — Encounter: Payer: Self-pay | Admitting: Internal Medicine

## 2022-05-03 VITALS — BP 128/62 | HR 91 | Temp 98.3°F | Resp 16 | Ht 64.0 in | Wt 154.0 lb

## 2022-05-03 DIAGNOSIS — M25561 Pain in right knee: Secondary | ICD-10-CM | POA: Diagnosis not present

## 2022-05-03 DIAGNOSIS — R0602 Shortness of breath: Secondary | ICD-10-CM | POA: Diagnosis not present

## 2022-05-03 DIAGNOSIS — F17210 Nicotine dependence, cigarettes, uncomplicated: Secondary | ICD-10-CM | POA: Diagnosis not present

## 2022-05-03 DIAGNOSIS — G8929 Other chronic pain: Secondary | ICD-10-CM | POA: Diagnosis not present

## 2022-05-03 DIAGNOSIS — J449 Chronic obstructive pulmonary disease, unspecified: Secondary | ICD-10-CM | POA: Diagnosis not present

## 2022-05-03 DIAGNOSIS — I700 Atherosclerosis of aorta: Secondary | ICD-10-CM | POA: Diagnosis not present

## 2022-05-03 DIAGNOSIS — M25562 Pain in left knee: Secondary | ICD-10-CM | POA: Diagnosis not present

## 2022-05-03 NOTE — Progress Notes (Signed)
[facility] [place], [place] [address]  Pulmonary Sleep Medicine   Office Visit Note  Patient Name: Wendy Garza DOB: [birth year] MRN [id number]  Date of Service: 05/03/2022  Complaints/HPI: She is doing well with her breathing. FEV1 looks improved from last visit. Currently on symbicort and albuterol for her inhalers. She does well with these both. Not requiring rescue. Denies having chest pain. Has some cough with occasional sputum. She is still smoking unfortunately.  ROS  General: (-) fever, (-) chills, (-) night sweats, (-) weakness Skin: (-) rashes, (-) itching,. Eyes: (-) visual changes, (-) redness, (-) itching. Nose and Sinuses: (-) nasal stuffiness or itchiness, (-) postnasal drip, (-) nosebleeds, (-) sinus trouble. Mouth and Throat: (-) sore throat, (-) hoarseness. Neck: (-) swollen glands, (-) enlarged thyroid, (-) neck pain. Respiratory: + cough, (-) bloody sputum, + shortness of breath, - wheezing. Cardiovascular: - ankle swelling, (-) chest pain. Lymphatic: (-) lymph node enlargement. Neurologic: (-) numbness, (-) tingling. Psychiatric: (-) anxiety, (-) depression   Current Medication: Outpatient Encounter Medications as of 05/03/2022  Medication Sig   albuterol (VENTOLIN HFA) 108 (90 Base) MCG/ACT inhaler Inhale 2 puffs into the lungs every 6 (six) hours as needed for wheezing or shortness of breath. Reported on 11/10/2015   amLODipine (NORVASC) 2.5 MG tablet TAKE 1 TABLET BY MOUTH EVERY DAY   budesonide-formoterol (SYMBICORT) 80-4.5 MCG/ACT inhaler Inhale 2 puffs into the lungs 2 (two) times daily.   ibuprofen (ADVIL) 200 MG tablet Take 600 mg by mouth every 8 (eight) hours as needed (for pain.).   levothyroxine (SYNTHROID) 50 MCG tablet Take 1 tablet (50 mcg total) by mouth daily before breakfast.   metoprolol tartrate (LOPRESSOR) 50 MG tablet TAKE 1/2 OF A TABLET BY MOUTH TWICE A DAY   mirabegron ER (MYRBETRIQ) 25 MG TB24 tablet Take 1  tablet (25 mg total) by mouth daily.   rosuvastatin (CRESTOR) 20 MG tablet Take 1 tablet (20 mg total) by mouth daily.   sertraline (ZOLOFT) 50 MG tablet TAKE 1 TABLET BY MOUTH EVERY DAY   No facility-administered encounter medications on file as of 05/03/2022.    Surgical History: Past Surgical History:  Procedure Laterality Date   ABDOMINAL HYSTERECTOMY     BREAST SURGERY     BIOPSY   CATARACT EXTRACTION W/PHACO Left 10/04/2015   Procedure: CATARACT EXTRACTION PHACO AND INTRAOCULAR LENS PLACEMENT (IOC);  Surgeon: Birder Robson, MD;  Location: [facility] ORS;  Service: Ophthalmology;  Laterality: Left;  [place]: 00:46.8 AP%: 21.7 CDE: 10.19 Lot # [id number]   CATARACT EXTRACTION W/PHACO Right 10/25/2015   Procedure: CATARACT EXTRACTION PHACO AND INTRAOCULAR LENS PLACEMENT ([place]);  Surgeon: Birder Robson, MD;  Location: [facility] ORS;  Service: Ophthalmology;  Laterality: Right;  [place] 00:48 AP% 25.2 CDE 12.16 fluid pack lot # [id number]   COLONOSCOPY WITH PROPOFOL N/A 05/28/2016   Procedure: COLONOSCOPY WITH PROPOFOL;  Surgeon: Manya Silvas, MD;  Location: [facility] ENDOSCOPY;  Service: Endoscopy;  Laterality: N/A;   COLONOSCOPY WITH PROPOFOL N/A 07/03/2021   Procedure: COLONOSCOPY WITH PROPOFOL;  Surgeon: Lin Landsman, MD;  Location: [facility] ENDOSCOPY;  Service: Gastroenterology;  Laterality: N/A;   CYSTOSCOPY WITH BIOPSY N/A 08/31/2019   Procedure: CYSTOSCOPY WITH bladder BIOPSY;  Surgeon: Hollice Espy, MD;  Location: [facility] ORS;  Service: Urology;  Laterality: N/A;   CYSTOSCOPY WITH FULGERATION N/A 08/31/2019   Procedure: CYSTOSCOPY WITH FULGERATION;  Surgeon: Hollice Espy, MD;  Location: [facility] ORS;  Service: Urology;  Laterality: N/A;    Medical  History: Past Medical History:  Diagnosis Date   Arthritis    Asthma    Cancer ([place]) 2003   UTERINE, total Hysterectomy   COPD (chronic obstructive pulmonary disease) (HCC)    Depression    Hyperlipidemia    Hypertension    Shortness of breath  dyspnea    DOE    Family History: Family History  Problem Relation Age of Onset   Kidney disease Mother    Hypertension Sister    Hyperlipidemia Sister    Hyperlipidemia Brother    Hypertension Brother    Kidney cancer Neg Hx    Prostate cancer Neg Hx     Social History: Social History   Socioeconomic History   Marital status: Single    Spouse name: Not on file   Number of children: Not on file   Years of education: Not on file   Highest education level: Not on file  Occupational History   Not on file  Tobacco Use   Smoking status: Every Day    Years: 25.00    Types: Cigarettes   Smokeless tobacco: Never   Tobacco comments:    3 or 4 cigarettes a day-reported on 05/03/22  Vaping Use   Vaping Use: Never used  Substance and Sexual Activity   Alcohol use: Yes    Comment: rarely   Drug use: No   Sexual activity: Not on file  Other Topics Concern   Not on file  Social History Narrative   Not on file   Social Determinants of Health   Financial Resource Strain: Low Risk  (05/10/2021)   Overall Financial Resource Strain (CARDIA)    Difficulty of Paying Living Expenses: Not very hard  Food Insecurity: Not on file  Transportation Needs: Not on file  Physical Activity: Not on file  Stress: Not on file  Social Connections: Not on file  Intimate Partner Violence: Not on file    Vital Signs: Blood pressure 128/62, pulse 91, temperature 98.3 F (36.8 C), resp. rate 16, height '5\' 4"'$  (1.626 m), weight 154 lb (69.9 kg), SpO2 95 %.  Examination: General Appearance: The patient is well-developed, well-nourished, and in no distress. Skin: Gross inspection of skin unremarkable. Head: normocephalic, no gross deformities. Eyes: no gross deformities noted. ENT: ears appear grossly normal no exudates. Neck: Supple. No thyromegaly. No LAD. Respiratory: no rhonchi noted at this time. Cardiovascular: Normal S1 and S2 without murmur or rub. Extremities: No cyanosis. pulses are  equal. Neurologic: Alert and oriented. No involuntary movements.  LABS: No results found for this or any previous visit (from the past 2160 hour(s)).  Radiology: CT CHEST LUNG CA SCREEN LOW DOSE W/O CM  Result Date: 12/04/2021 CLINICAL DATA:  Current smoker with 22.5 pack-year history EXAM: CT CHEST WITHOUT CONTRAST LOW-DOSE FOR LUNG CANCER SCREENING TECHNIQUE: Multidetector CT imaging of the chest was performed following the standard protocol without IV contrast. RADIATION DOSE REDUCTION: This exam was performed according to the departmental dose-optimization program which includes automated exposure control, adjustment of the mA and/or kV according to patient size and/or use of iterative reconstruction technique. COMPARISON:  None FINDINGS: Cardiovascular: Normal heart size. No pericardial effusion. Atherosclerotic disease of the thoracic aorta. Coronary artery calcifications of the LAD and RCA. Mediastinum/Nodes: Esophagus and thyroid are unremarkable. No pathologically enlarged lymph nodes seen in the chest. Lungs/Pleura: Central airways are patent. Mild paraseptal and centrilobular emphysema. Upper lobe predominant subpleural interstitial opacities, likely smoking related interstitial changes. No consolidation, pleural effusion or pneumothorax. Right lung  apex solid nodules, likely pleuroparenchymal scarring. Reference nodule measures 5.4 mm on image 35. Upper Abdomen: No acute abnormality. Musculoskeletal: No chest wall mass or suspicious bone lesions identified. IMPRESSION: 1. Lung-RADS 2S, benign appearance or behavior. Continue annual screening with low-dose chest CT without contrast in 12 months. S modifier for coronary artery calcifications. 2. Coronary artery calcifications of the LAD and RCA, recommend ASCVD risk assessment. 3. Aortic Atherosclerosis (ICD10-I70.0) and Emphysema (ICD10-J43.9). Electronically Signed   By: Yetta Glassman M.D.   On: 12/04/2021 13:08    No results found.  No  results found.    Assessment and Plan: Patient Active Problem List   Diagnosis Date Noted   History of colonic polyps    Subclinical hypothyroidism 01/20/2021   Prediabetes 01/20/2021   Cutaneous candidiasis 06/04/2020   Need for shingles vaccine 06/04/2020   Hematuria 11/18/2019   Atherosclerosis of aorta ([place]) 11/18/2019   Encounter for general adult medical examination with abnormal findings 05/31/2019   Benign essential tremor 05/31/2019   Need for vaccination against Streptococcus pneumoniae using pneumococcal conjugate vaccine 7 05/31/2019   Dysuria 05/31/2019   Encounter for hepatitis C screening test for low risk patient 05/31/2019   Screening for breast cancer 05/21/2018   Needs flu shot 05/21/2018   Obstructive chronic bronchitis without exacerbation ([place]) 12/03/2017   Nicotine dependence, cigarettes, uncomplicated 29/92/4268   Mild intermittent asthma without complication 34/19/6222   Vitamin D deficiency 10/17/2017   Mixed hyperlipidemia 10/08/2017   Essential hypertension 10/08/2017   Generalized anxiety disorder 10/08/2017   SUI (stress urinary incontinence, female) 11/12/2015   Microscopic hematuria 11/12/2015   Urge incontinence 11/12/2015    1. SOB (shortness of breath) Continues to have issues with shortness of breath related to underlying COPD and ongoing cigarette use.  We will get a follow-up spirometry today - Spirometry with Graph  2. Obstructive chronic bronchitis without exacerbation ([place]) As noted she does have obstructive chronic bronchitis COPD reviewed her medications she is on Symbicort Ventolin and is supposed be taking as prescribed I am not sure if she is 100% compliant with this regimen but I did reiterate that its importance about being compliant  3. Nicotine dependence, cigarettes, uncomplicated Unfortunately this has been a challenge for [place] to get her to quit smoking counseling once again provided  General Counseling: I have discussed  the findings of the evaluation and examination with Wendy Garza.  I have also discussed any further diagnostic evaluation thatmay be needed or ordered today. Wendy Garza verbalizes understanding of the findings of todays visit. We also reviewed her medications today and discussed drug interactions and side effects including but not limited excessive drowsiness and altered mental states. We also discussed that there is always a risk not just to her but also people around her. she has been encouraged to call the office with any questions or concerns that should arise related to todays visit.  Orders Placed This Encounter  Procedures   Spirometry with Graph    Order Specific Question:   Where should this test be performed?    Answer:   [facility]    Order Specific Question:   Basic spirometry    Answer:   Yes     Time spent: 73  I have personally obtained a history, examined the patient, evaluated laboratory and imaging results, formulated the assessment and plan and placed orders.    Allyne Gee, MD [facility] Pulmonary and Critical Care Sleep medicine

## 2022-05-03 NOTE — Patient Instructions (Signed)
Chronic Obstructive Pulmonary Disease  Chronic obstructive pulmonary disease (COPD) is a long-term (chronic) lung problem. When you have COPD, it is hard for air to get in and out of your lungs. Usually the condition gets worse over time, and your lungs will never return to normal. There are things you can do to keep yourself as healthy as possible. What are the causes? Smoking. This is the most common cause. Certain genes passed from parent to child (inherited). What increases the risk? Being exposed to secondhand smoke from cigarettes, pipes, or cigars. Being exposed to chemicals and other irritants, such as fumes and dust in the work environment. Having chronic lung conditions or infections. What are the signs or symptoms? Shortness of breath, especially during physical activity. A long-term cough with a large amount of thick mucus. Sometimes, the cough may not have any mucus (dry cough). Wheezing. Breathing quickly. Skin that looks gray or blue, especially in the fingers, toes, or lips. Feeling tired (fatigue). Weight loss. Chest tightness. Having infections often. Episodes when breathing symptoms become much worse (exacerbations). At the later stages of this disease, you may have swelling in the ankles, feet, or legs. How is this treated? Taking medicines. Quitting smoking, if you smoke. Rehabilitation. This includes steps to make your body work better. It may involve a team of specialists. Doing exercises. Making changes to your diet. Using oxygen. Lung surgery. Lung transplant. Comfort measures (palliative care). Follow these instructions at home: Medicines Take over-the-counter and prescription medicines only as told by your doctor. Talk to your doctor before taking any cough or allergy medicines. You may need to avoid medicines that cause your lungs to be dry. Lifestyle If you smoke, stop smoking. Smoking makes the problem worse. Do not smoke or use any products that  contain nicotine or tobacco. If you need help quitting, ask your doctor. Avoid being around things that make your breathing worse. This may include smoke, chemicals, and fumes. Stay active, but remember to rest as well. Learn and use tips on how to manage stress and control your breathing. Make sure you get enough sleep. Most adults need at least 7 hours of sleep every night. Eat healthy foods. Eat smaller meals more often. Rest before meals. Controlled breathing Learn and use tips on how to control your breathing as told by your doctor. Try: Breathing in (inhaling) through your nose for 1 second. Then, pucker your lips and breath out (exhale) through your lips for 2 seconds. Putting one hand on your belly (abdomen). Breathe in slowly through your nose for 1 second. Your hand on your belly should move out. Pucker your lips and breathe out slowly through your lips. Your hand on your belly should move in as you breathe out.  Controlled coughing Learn and use controlled coughing to clear mucus from your lungs. Follow these steps: Lean your head a little forward. Breathe in deeply. Try to hold your breath for 3 seconds. Keep your mouth slightly open while coughing 2 times. Spit any mucus out into a tissue. Rest and do the steps again 1 or 2 times as needed. General instructions Make sure you get all the shots (vaccines) that your doctor recommends. Ask your doctor about a flu shot and a pneumonia shot. Use oxygen therapy and pulmonary rehabilitation if told by your doctor. If you need home oxygen therapy, ask your doctor if you should buy a tool to measure your oxygen level (oximeter). Make a COPD action plan with your doctor. This helps you   to know what to do if you feel worse than usual. Manage any other conditions you have as told by your doctor. Avoid going outside when it is very hot, cold, or humid. Avoid people who have a sickness you can catch (contagious). Keep all follow-up  visits. Contact a doctor if: You cough up more mucus than usual. There is a change in the color or thickness of the mucus. It is harder to breathe than usual. Your breathing is faster than usual. You have trouble sleeping. You need to use your medicines more often than usual. You have trouble doing your normal activities such as getting dressed or walking around the house. Get help right away if: You have shortness of breath while resting. You have shortness of breath that stops you from: Being able to talk. Doing normal activities. Your chest hurts for longer than 5 minutes. Your skin color is more blue than usual. Your pulse oximeter shows that you have low oxygen for longer than 5 minutes. You have a fever. You feel too tired to breathe normally. These symptoms may represent a serious problem that is an emergency. Do not wait to see if the symptoms will go away. Get medical help right away. Call your local emergency services (911 in the [place] Do not drive yourself to the hospital. Summary Chronic obstructive pulmonary disease (COPD) is a long-term lung problem. The way your lungs work will never return to normal. Usually the condition gets worse over time. There are things you can do to keep yourself as healthy as possible. Take over-the-counter and prescription medicines only as told by your doctor. If you smoke, stop. Smoking makes the problem worse. This information is not intended to replace advice given to you by your health care provider. Make sure you discuss any questions you have with your health care provider. Document Revised: 06/21/2020 Document Reviewed: 06/21/2020 Elsevier Patient Education  2023 [employer]  

## 2022-05-11 ENCOUNTER — Other Ambulatory Visit: Payer: Self-pay | Admitting: Nurse Practitioner

## 2022-05-11 DIAGNOSIS — E782 Mixed hyperlipidemia: Secondary | ICD-10-CM | POA: Diagnosis not present

## 2022-05-11 DIAGNOSIS — M1712 Unilateral primary osteoarthritis, left knee: Secondary | ICD-10-CM | POA: Diagnosis not present

## 2022-05-11 DIAGNOSIS — R7303 Prediabetes: Secondary | ICD-10-CM | POA: Diagnosis not present

## 2022-05-11 DIAGNOSIS — E559 Vitamin D deficiency, unspecified: Secondary | ICD-10-CM | POA: Diagnosis not present

## 2022-05-11 DIAGNOSIS — E039 Hypothyroidism, unspecified: Secondary | ICD-10-CM | POA: Diagnosis not present

## 2022-05-11 DIAGNOSIS — Z0001 Encounter for general adult medical examination with abnormal findings: Secondary | ICD-10-CM | POA: Diagnosis not present

## 2022-05-12 LAB — CBC WITH DIFFERENTIAL/PLATELET
Basophils Absolute: 0.1 10*3/uL (ref 0.0–0.2)
Basos: 1 %
EOS (ABSOLUTE): 0.5 10*3/uL — ABNORMAL HIGH (ref 0.0–0.4)
Eos: 6 %
Hematocrit: 41.6 % (ref 34.0–46.6)
Hemoglobin: 13.9 g/dL (ref 11.1–15.9)
Immature Grans (Abs): 0 10*3/uL (ref 0.0–0.1)
Immature Granulocytes: 0 %
Lymphocytes Absolute: 1.8 10*3/uL (ref 0.7–3.1)
Lymphs: 22 %
MCH: 32.2 pg (ref 26.6–33.0)
MCHC: 33.4 g/dL (ref 31.5–35.7)
MCV: 96 fL (ref 79–97)
Monocytes Absolute: 0.8 10*3/uL (ref 0.1–0.9)
Monocytes: 9 %
Neutrophils Absolute: 4.9 10*3/uL (ref 1.4–7.0)
Neutrophils: 62 %
Platelets: 290 10*3/uL (ref 150–450)
RBC: 4.32 x10E6/uL (ref 3.77–5.28)
RDW: 12.7 % (ref 11.7–15.4)
WBC: 8 10*3/uL (ref 3.4–10.8)

## 2022-05-12 LAB — TSH: TSH: 1.5 u[IU]/mL (ref 0.450–4.500)

## 2022-05-12 LAB — COMPREHENSIVE METABOLIC PANEL
ALT: 16 IU/L (ref 0–32)
AST: 17 IU/L (ref 0–40)
Albumin/Globulin Ratio: 1.7 (ref 1.2–2.2)
Albumin: 4.3 g/dL (ref 3.8–4.8)
Alkaline Phosphatase: 71 IU/L (ref 44–121)
BUN/Creatinine Ratio: 19 (ref 12–28)
BUN: 16 mg/dL (ref 8–27)
Bilirubin Total: 0.5 mg/dL (ref 0.0–1.2)
CO2: 24 mmol/L (ref 20–29)
Calcium: 9.5 mg/dL (ref 8.7–10.3)
Chloride: 104 mmol/L (ref 96–106)
Creatinine, Ser: 0.85 mg/dL (ref 0.57–1.00)
Globulin, Total: 2.5 g/dL (ref 1.5–4.5)
Glucose: 102 mg/dL — ABNORMAL HIGH (ref 70–99)
Potassium: 4.7 mmol/L (ref 3.5–5.2)
Sodium: 140 mmol/L (ref 134–144)
Total Protein: 6.8 g/dL (ref 6.0–8.5)
eGFR: 72 mL/min/{1.73_m2} (ref >59)

## 2022-05-12 LAB — LIPID PANEL
Chol/HDL Ratio: 4 ratio (ref 0.0–4.4)
Cholesterol, Total: 103 mg/dL (ref 100–199)
HDL: 26 mg/dL — ABNORMAL LOW (ref >39)
LDL Chol Calc (NIH): 49 mg/dL (ref 0–99)
Triglycerides: 165 mg/dL — ABNORMAL HIGH (ref 0–149)
VLDL Cholesterol Cal: 28 mg/dL (ref 5–40)

## 2022-05-12 LAB — HGB A1C W/O EAG: Hgb A1c MFr Bld: 5.9 % — ABNORMAL HIGH (ref 4.8–5.6)

## 2022-05-12 LAB — VITAMIN D 25 HYDROXY (VIT D DEFICIENCY, FRACTURES): Vit D, 25-Hydroxy: 23.6 ng/mL — ABNORMAL LOW (ref 30.0–100.0)

## 2022-05-12 LAB — T4, FREE: Free T4: 1.33 ng/dL (ref 0.82–1.77)

## 2022-05-13 DIAGNOSIS — M1712 Unilateral primary osteoarthritis, left knee: Secondary | ICD-10-CM | POA: Insufficient documentation

## 2022-05-24 ENCOUNTER — Ambulatory Visit (INDEPENDENT_AMBULATORY_CARE_PROVIDER_SITE_OTHER): Payer: PPO | Admitting: Nurse Practitioner

## 2022-05-24 ENCOUNTER — Encounter: Payer: Self-pay | Admitting: Nurse Practitioner

## 2022-05-24 VITALS — BP 131/75 | HR 88 | Temp 97.9°F | Resp 16 | Ht 64.0 in | Wt 152.4 lb

## 2022-05-24 DIAGNOSIS — R7303 Prediabetes: Secondary | ICD-10-CM | POA: Diagnosis not present

## 2022-05-24 DIAGNOSIS — Z23 Encounter for immunization: Secondary | ICD-10-CM

## 2022-05-24 DIAGNOSIS — Z0001 Encounter for general adult medical examination with abnormal findings: Secondary | ICD-10-CM

## 2022-05-24 DIAGNOSIS — I700 Atherosclerosis of aorta: Secondary | ICD-10-CM

## 2022-05-24 DIAGNOSIS — I10 Essential (primary) hypertension: Secondary | ICD-10-CM | POA: Diagnosis not present

## 2022-05-24 DIAGNOSIS — R300 Dysuria: Secondary | ICD-10-CM

## 2022-05-24 MED ORDER — ZOSTER VAC RECOMB ADJUVANTED 50 MCG/0.5ML IM SUSR: 0.5000 mL | Freq: Once | INTRAMUSCULAR | 0 refills | Status: AC

## 2022-05-24 NOTE — Progress Notes (Signed)
[facility] [place], [place] [address]  Internal MEDICINE  Office Visit Note  Patient Name: Wendy Garza  [id number]  [id number]  Date of Service: 05/24/2022  Chief Complaint  Patient presents with   Medicare Wellness   Depression   Hyperlipidemia   Hypertension    HPI Wendy Garza presents for an annual well visit and physical exam.  Well-appearing 76 year old female with hypertension, asthma, hypothyroidism, hyperlipidemia, and GAD.  --mammogram due in late October, goes to [facility] imaging --last colonoscopy was in November last year. Due for repeat in 7 years.  --DEXA scan was done in 2018 Discussed lab results with patient: --low vitamin D 23.8 --A1c 5.9 --cholesterol levels are improving Requesting shingles vaccine Does not need any med refills.     Current Medication: Outpatient Encounter Medications as of 05/24/2022  Medication Sig   albuterol (VENTOLIN HFA) 108 (90 Base) MCG/ACT inhaler Inhale 2 puffs into the lungs every 6 (six) hours as needed for wheezing or shortness of breath. Reported on 11/10/2015   amLODipine (NORVASC) 2.5 MG tablet TAKE 1 TABLET BY MOUTH EVERY DAY   budesonide-formoterol (SYMBICORT) 80-4.5 MCG/ACT inhaler Inhale 2 puffs into the lungs 2 (two) times daily.   ibuprofen (ADVIL) 200 MG tablet Take 600 mg by mouth every 8 (eight) hours as needed (for pain.).   levothyroxine (SYNTHROID) 50 MCG tablet Take 1 tablet (50 mcg total) by mouth daily before breakfast.   metoprolol tartrate (LOPRESSOR) 50 MG tablet TAKE 1/2 OF A TABLET BY MOUTH TWICE A DAY   mirabegron ER (MYRBETRIQ) 25 MG TB24 tablet Take 1 tablet (25 mg total) by mouth daily.   rosuvastatin (CRESTOR) 20 MG tablet Take 1 tablet (20 mg total) by mouth daily.   sertraline (ZOLOFT) 50 MG tablet TAKE 1 TABLET BY MOUTH EVERY DAY   [DISCONTINUED] Zoster Vaccine Adjuvanted [facility]) injection Inject 0.5 mLs into the muscle once.   Zoster Vaccine Adjuvanted  [facility]) injection Inject 0.5 mLs into the muscle once for 1 dose.   No facility-administered encounter medications on file as of 05/24/2022.    Surgical History: Past Surgical History:  Procedure Laterality Date   ABDOMINAL HYSTERECTOMY     BREAST SURGERY     BIOPSY   CATARACT EXTRACTION W/PHACO Left 10/04/2015   Procedure: CATARACT EXTRACTION PHACO AND INTRAOCULAR LENS PLACEMENT (IOC);  Surgeon: Birder Robson, MD;  Location: [facility] ORS;  Service: Ophthalmology;  Laterality: Left;  [place]: 00:46.8 AP%: 21.7 CDE: 10.19 Lot # [id number]   CATARACT EXTRACTION W/PHACO Right 10/25/2015   Procedure: CATARACT EXTRACTION PHACO AND INTRAOCULAR LENS PLACEMENT ([place]);  Surgeon: Birder Robson, MD;  Location: [facility] ORS;  Service: Ophthalmology;  Laterality: Right;  [place] 00:48 AP% 25.2 CDE 12.16 fluid pack lot # [id number]   COLONOSCOPY WITH PROPOFOL N/A 05/28/2016   Procedure: COLONOSCOPY WITH PROPOFOL;  Surgeon: Manya Silvas, MD;  Location: [facility] ENDOSCOPY;  Service: Endoscopy;  Laterality: N/A;   COLONOSCOPY WITH PROPOFOL N/A 07/03/2021   Procedure: COLONOSCOPY WITH PROPOFOL;  Surgeon: Lin Landsman, MD;  Location: [facility] ENDOSCOPY;  Service: Gastroenterology;  Laterality: N/A;   CYSTOSCOPY WITH BIOPSY N/A 08/31/2019   Procedure: CYSTOSCOPY WITH bladder BIOPSY;  Surgeon: Hollice Espy, MD;  Location: [facility] ORS;  Service: Urology;  Laterality: N/A;   CYSTOSCOPY WITH FULGERATION N/A 08/31/2019   Procedure: CYSTOSCOPY WITH FULGERATION;  Surgeon: Hollice Espy, MD;  Location: [facility] ORS;  Service: Urology;  Laterality: N/A;    Medical History: Past Medical History:  Diagnosis Date  Arthritis    Asthma    Cancer ([place]) 2003   UTERINE, total Hysterectomy   COPD (chronic obstructive pulmonary disease) (HCC)    Depression    Hyperlipidemia    Hypertension    Shortness of breath dyspnea    DOE    Family History: Family History  Problem Relation Age of Onset   Kidney disease Mother     Hypertension Sister    Hyperlipidemia Sister    Hyperlipidemia Brother    Hypertension Brother    Kidney cancer Neg Hx    Prostate cancer Neg Hx     Social History   Socioeconomic History   Marital status: Single    Spouse name: Not on file   Number of children: Not on file   Years of education: Not on file   Highest education level: Not on file  Occupational History   Not on file  Tobacco Use   Smoking status: Every Day    Years: 25.00    Types: Cigarettes   Smokeless tobacco: Never   Tobacco comments:    3 or 4 cigarettes a day-reported on 05/03/22  Vaping Use   Vaping Use: Never used  Substance and Sexual Activity   Alcohol use: Yes    Comment: rarely   Drug use: No   Sexual activity: Not on file  Other Topics Concern   Not on file  Social History Narrative   Not on file   Social Determinants of Health   Financial Resource Strain: Low Risk  (05/10/2021)   Overall Financial Resource Strain (CARDIA)    Difficulty of Paying Living Expenses: Not very hard  Food Insecurity: Not on file  Transportation Needs: Not on file  Physical Activity: Not on file  Stress: Not on file  Social Connections: Not on file  Intimate Partner Violence: Not on file      Review of Systems  Constitutional:  Negative for activity change, appetite change, chills, fatigue, fever and unexpected weight change.  HENT: Negative.  Negative for congestion, ear pain, rhinorrhea, sore throat and trouble swallowing.   Eyes: Negative.   Respiratory: Negative.  Negative for cough, chest tightness, shortness of breath and wheezing.   Cardiovascular: Negative.  Negative for chest pain.  Gastrointestinal: Negative.  Negative for abdominal pain, blood in stool, constipation, diarrhea, nausea and vomiting.  Endocrine: Negative.   Genitourinary: Negative.  Negative for difficulty urinating, dysuria, frequency, hematuria and urgency.  Musculoskeletal: Negative.  Negative for arthralgias, back pain, joint  swelling, myalgias and neck pain.  Skin: Negative.  Negative for rash and wound.  Allergic/Immunologic: Negative.  Negative for immunocompromised state.  Neurological: Negative.  Negative for dizziness, seizures, numbness and headaches.  Hematological: Negative.   Psychiatric/Behavioral: Negative.  Negative for behavioral problems, self-injury and suicidal ideas. The patient is not nervous/anxious.     Vital Signs: BP 131/75   Pulse 88   Temp 97.9 F (36.6 C)   Resp 16   Ht '5\' 4"'$  (1.626 m)   Wt 152 lb 6.4 oz (69.1 kg)   LMP  (LMP Unknown)   SpO2 94%   BMI 26.16 kg/m    Physical Exam Constitutional:      General: She is awake. She is not in acute distress.    Appearance: Normal appearance. She is well-developed, well-groomed and normal weight. She is not ill-appearing or diaphoretic.  HENT:     Head: Normocephalic and atraumatic.     Right Ear: Tympanic membrane, ear canal and external ear normal.  Left Ear: Tympanic membrane, ear canal and external ear normal.     Nose: Nose normal. No congestion or rhinorrhea.     Mouth/Throat:     Lips: Pink.     Mouth: Mucous membranes are moist.     Pharynx: Oropharynx is clear. Uvula midline. No oropharyngeal exudate or posterior oropharyngeal erythema.  Eyes:     General: Lids are normal. Vision grossly intact. Gaze aligned appropriately. No scleral icterus.       Right eye: No discharge.        Left eye: No discharge.     Extraocular Movements: Extraocular movements intact.     Conjunctiva/sclera: Conjunctivae normal.     Pupils: Pupils are equal, round, and reactive to light.     Funduscopic exam:    Right eye: Red reflex present.        Left eye: Red reflex present. Neck:     Thyroid: No thyromegaly.     Vascular: No carotid bruit or JVD.     Trachea: Trachea and phonation normal. No tracheal deviation.  Cardiovascular:     Rate and Rhythm: Normal rate and regular rhythm.     Pulses: Normal pulses.     Heart sounds:  Normal heart sounds, S1 normal and S2 normal. No murmur heard.    No friction rub. No gallop.  Pulmonary:     Effort: Pulmonary effort is normal. No accessory muscle usage or respiratory distress.     Breath sounds: Normal breath sounds and air entry. No stridor. No wheezing or rales.  Chest:     Chest wall: No tenderness.     Comments: Declined clinical breast exam, gets yearly mammograms.  Abdominal:     General: Bowel sounds are normal. There is no distension.     Palpations: Abdomen is soft. There is no shifting dullness, fluid wave, mass or pulsatile mass.     Tenderness: There is no abdominal tenderness. There is no guarding or rebound.  Musculoskeletal:        General: No tenderness or deformity. Normal range of motion.     Cervical back: Normal range of motion and neck supple.     Right lower leg: No edema.     Left lower leg: No edema.  Lymphadenopathy:     Cervical: No cervical adenopathy.  Skin:    General: Skin is warm and dry.     Capillary Refill: Capillary refill takes less than 2 seconds.     Coloration: Skin is not pale.     Findings: No erythema or rash.  Neurological:     Mental Status: She is alert and oriented to person, place, and time.     Cranial Nerves: No cranial nerve deficit.     Motor: No abnormal muscle tone.     Coordination: Coordination normal.     Gait: Gait normal.     Deep Tendon Reflexes: Reflexes are normal and symmetric.  Psychiatric:        Mood and Affect: Mood and affect normal.        Behavior: Behavior normal. Behavior is cooperative.        Thought Content: Thought content normal.        Judgment: Judgment normal.        Assessment/Plan: 1. Encounter for general adult medical examination with abnormal findings Age-appropriate preventive screenings and vaccinations discussed, annual physical exam completed. Routine lab results discussed with patient today. PHM updated.  - Zoster Vaccine Adjuvanted [facility]) injection; Inject  0.5  mLs into the muscle once for 1 dose.  Dispense: 0.5 mL; Refill: 0  2. Prediabetes A1C stable, 5.9, follow up in 6 months to repeat a1c. Information handouts provided to patient with her AVS.   3. Essential hypertension Stable with current medications continue as prescribed.  4. Atherosclerosis of aorta (HCC) Taking rosuvastatin, continue as prescribed  5. Dysuria Routine urinalysis done  - UA/M w/rflx Culture, Routine  6. Need for vaccination Shingles vaccine ordered      General Counseling: Wendy Garza understanding of the findings of todays visit and agrees with plan of treatment. I have discussed any further diagnostic evaluation that may be needed or ordered today. We also reviewed her medications today. she has been encouraged to call the office with any questions or concerns that should arise related to todays visit.    Orders Placed This Encounter  Procedures   UA/M w/rflx Culture, Routine    Meds ordered this encounter  Medications   Zoster Vaccine Adjuvanted [facility]) injection    Sig: Inject 0.5 mLs into the muscle once for 1 dose.    Dispense:  0.5 mL    Refill:  0    Return in about 6 months (around 11/22/2022) for F/U, Recheck A1C, Nirvi Boehler PCP.   Total time spent:30 Minutes Time spent includes review of chart, medications, test results, and follow up plan with the patient.   [place] Controlled Substance Database was reviewed by me.  This patient was seen by [name], FNP-C in collaboration with Dr. Clayborn Bigness as a part of collaborative care agreement.  Latrail Pounders R. Valetta Fuller, MSN, FNP-C Internal medicine

## 2022-05-31 LAB — UA/M W/RFLX CULTURE, ROUTINE
Bilirubin, UA: NEGATIVE
Glucose, UA: NEGATIVE
Nitrite, UA: NEGATIVE
Specific Gravity, UA: 1.021 (ref 1.005–1.030)
Urobilinogen, Ur: 1 mg/dL (ref 0.2–1.0)
pH, UA: 5 (ref 5.0–7.5)

## 2022-05-31 LAB — URINE CULTURE, REFLEX

## 2022-05-31 LAB — MICROSCOPIC EXAMINATION
Bacteria, UA: NONE SEEN
Casts: NONE SEEN /lpf
WBC, UA: NONE SEEN /hpf (ref 0–5)

## 2022-07-09 DIAGNOSIS — Z1231 Encounter for screening mammogram for malignant neoplasm of breast: Secondary | ICD-10-CM | POA: Diagnosis not present

## 2022-08-08 ENCOUNTER — Other Ambulatory Visit: Payer: Self-pay | Admitting: Nurse Practitioner

## 2022-08-08 DIAGNOSIS — E782 Mixed hyperlipidemia: Secondary | ICD-10-CM

## 2022-08-15 DIAGNOSIS — H43813 Vitreous degeneration, bilateral: Secondary | ICD-10-CM | POA: Diagnosis not present

## 2022-08-28 ENCOUNTER — Telehealth: Payer: Self-pay | Admitting: Nurse Practitioner

## 2022-08-28 NOTE — Telephone Encounter (Signed)
Lvm to move 11/22/22 appointment-Toni

## 2022-09-19 ENCOUNTER — Other Ambulatory Visit: Payer: Self-pay | Admitting: Physician Assistant

## 2022-09-19 DIAGNOSIS — E038 Other specified hypothyroidism: Secondary | ICD-10-CM

## 2022-09-26 ENCOUNTER — Other Ambulatory Visit: Payer: Self-pay | Admitting: Nurse Practitioner

## 2022-09-26 DIAGNOSIS — I10 Essential (primary) hypertension: Secondary | ICD-10-CM

## 2022-10-02 NOTE — Progress Notes (Unsigned)
10/03/2022 4:40 PM   Wendy Garza 1945-12-11 [id number]  Referring provider: Lavera Guise, [place],  [place] [address]  Urological history: 1. High risk hematuria -smoker -CTU (2020) - no worrisome findings -cysto (2017) - NED -cysto (2020) - lesion in the dome -bladder biopsy (2021) - cystitis cystica -urine cytology (2022) - negative -urine cytology (2023) - negative -cysto (2023) - NED  Chief Complaint  Patient presents with   Follow-up    1 yr, hematuria    HPI: Wendy Garza is a 77 y.o. female who presents today for yearly follow up.  UA yellow slightly cloudy, specific gravity 1.025, 2+ blood, pH 5.0, leukocyte +1, 11-30 WBCs, 11-30 RBCs, greater than 10 epithelial cells, mucus threads present and many bacteria  PVR 32 mL   She is having 1-7 daytime urinations, 1-2 episodes of nocturia with a strong urge to urinate.  She has mixed incontinence.  She is leaking 3 more times daily.  She is wearing 3 panty liners daily.  Patient denies any modifying or aggravating factors.  Patient denies any gross hematuria, dysuria or suprapubic/flank pain.  Patient denies any fevers, chills, nausea or vomiting.    She has not taken the Myrbetriq 25 mg daily secondary to the donut hole.     PMH: Past Medical History:  Diagnosis Date   Arthritis    Asthma    Cancer ([place]) 2003   UTERINE, total Hysterectomy   COPD (chronic obstructive pulmonary disease) (HCC)    Depression    Hyperlipidemia    Hypertension    Shortness of breath dyspnea    DOE    Surgical History: Past Surgical History:  Procedure Laterality Date   ABDOMINAL HYSTERECTOMY     BREAST SURGERY     BIOPSY   CATARACT EXTRACTION W/PHACO Left 10/04/2015   Procedure: CATARACT EXTRACTION PHACO AND INTRAOCULAR LENS PLACEMENT ([place]);  Surgeon: Birder Robson, MD;  Location: [facility] ORS;  Service: Ophthalmology;  Laterality: Left;  [place]: 00:46.8 AP%: 21.7 CDE: 10.19 Lot # [id number]   CATARACT  EXTRACTION W/PHACO Right 10/25/2015   Procedure: CATARACT EXTRACTION PHACO AND INTRAOCULAR LENS PLACEMENT ([place]);  Surgeon: Birder Robson, MD;  Location: [facility] ORS;  Service: Ophthalmology;  Laterality: Right;  [place] 00:48 AP% 25.2 CDE 12.16 fluid pack lot # [id number]   COLONOSCOPY WITH PROPOFOL N/A 05/28/2016   Procedure: COLONOSCOPY WITH PROPOFOL;  Surgeon: Manya Silvas, MD;  Location: [facility] ENDOSCOPY;  Service: Endoscopy;  Laterality: N/A;   COLONOSCOPY WITH PROPOFOL N/A 07/03/2021   Procedure: COLONOSCOPY WITH PROPOFOL;  Surgeon: Lin Landsman, MD;  [facility] ENDOSCOPY;  Service: Gastroenterology;  Laterality: N/A;   CYSTOSCOPY WITH BIOPSY N/A 08/31/2019   Procedure: CYSTOSCOPY WITH bladder BIOPSY;  Surgeon: Hollice Espy, MD;  Location: [facility] ORS;  Service: Urology;  Laterality: N/A;   CYSTOSCOPY WITH FULGERATION N/A 08/31/2019   Procedure: CYSTOSCOPY WITH FULGERATION;  Surgeon: Hollice Espy, MD;  Location: [facility] ORS;  Service: Urology;  Laterality: N/A;    Home Medications:  Allergies as of 10/03/2022       Reactions   Aspirin Other (See Comments)   Patient cannot recall what happened   Crab [shellfish Allergy] Hives   Latex Rash   Nickel Rash        Medication List        Accurate as of October 03, 2022 11:59 PM. If you have any questions, ask your nurse or doctor.  albuterol 108 (90 Base) MCG/ACT inhaler Commonly known as: VENTOLIN HFA Inhale 2 puffs into the lungs every 6 (six) hours as needed for wheezing or shortness of breath. Reported on 11/10/2015   amLODipine 2.5 MG tablet Commonly known as: NORVASC TAKE 1 TABLET BY MOUTH EVERY DAY   budesonide-formoterol 80-4.5 MCG/ACT inhaler Commonly known as: SYMBICORT Inhale 2 puffs into the lungs 2 (two) times daily.   ibuprofen 200 MG tablet Commonly known as: ADVIL Take 600 mg by mouth every 8 (eight) hours as needed (for pain.).   levothyroxine 50 MCG tablet Commonly known as: SYNTHROID TAKE  1 TABLET BY MOUTH DAILY BEFORE BREAKFAST   metoprolol tartrate 50 MG tablet Commonly known as: LOPRESSOR TAKE 1/2 OF A TABLET BY MOUTH TWICE A DAY   mirabegron ER 25 MG Tb24 tablet Commonly known as: MYRBETRIQ Take 1 tablet (25 mg total) by mouth daily.   rosuvastatin 20 MG tablet Commonly known as: CRESTOR TAKE 1 TABLET BY MOUTH EVERY DAY   sertraline 50 MG tablet Commonly known as: ZOLOFT TAKE 1 TABLET BY MOUTH EVERY DAY        Allergies:  Allergies  Allergen Reactions   Aspirin Other (See Comments)    Patient cannot recall what happened   Crab [Shellfish Allergy] Hives   Latex Rash   Nickel Rash    Family History: Family History  Problem Relation Age of Onset   Kidney disease Mother    Hypertension Sister    Hyperlipidemia Sister    Hyperlipidemia Brother    Hypertension Brother    Kidney cancer Neg Hx    Prostate cancer Neg Hx     Social History:  reports that she has been smoking cigarettes. She has been exposed to tobacco smoke. She has never used smokeless tobacco. She reports current alcohol use. She reports that she does not use drugs.  ROS: Pertinent ROS in HPI  Physical Exam: BP 136/76   Pulse 76   Ht '5\' 4"'$  (1.626 m)   Wt 160 lb (72.6 kg)   LMP  (LMP Unknown)   BMI 27.46 kg/m   Constitutional:  Well nourished. Alert and oriented, No acute distress. HEENT: [place] AT, moist mucus membranes.  Trachea midline Cardiovascular: No clubbing, cyanosis, or edema. Respiratory: Normal respiratory effort, no increased work of breathing. Neurologic: Grossly intact, no focal deficits, moving all 4 extremities. Psychiatric: Normal mood and affect.    Laboratory Data: Lab Results  Component Value Date   WBC 8.0 05/11/2022   HGB 13.9 05/11/2022   HCT 41.6 05/11/2022   MCV 96 05/11/2022   PLT 290 05/11/2022    Lab Results  Component Value Date   CREATININE 0.85 05/11/2022    Lab Results  Component Value Date   HGBA1C 5.9 (H) 05/11/2022    Lab  Results  Component Value Date   TSH 1.500 05/11/2022       Component Value Date/Time   CHOL 103 05/11/2022 0953   HDL 26 (L) 05/11/2022 0953   CHOLHDL 4.0 05/11/2022 0953   LDLCALC 49 05/11/2022 0953    Lab Results  Component Value Date   AST 17 05/11/2022   Lab Results  Component Value Date   ALT 16 05/11/2022    Urinalysis See EPIC and HPI I have reviewed the labs.   Pertinent Imaging:  10/03/22 14:11  Scan Result 32 ml    Assessment & Plan:    1. High risk hematuria -smoker -work up 2020 - cystitis cystica -no reports of  gross heme -UA 11-30 RBC's -urine sent for culture  -we did not have enough urine for cytology -if urine culture is negative for infection, we will obtain cytology -if urine culture is positive, will treat and then send cytology and recheck for micro heme  2. Incontinence -she feels that the Myrbetriq did help when she was taking the medication -I gave her samples to help bridge the donut hole gap   Return for pending urine culture results .  These notes generated with voice recognition software. I apologize for typographical errors.  [place], [place] [address]  [place], [place] [address] [phone]

## 2022-10-03 ENCOUNTER — Encounter: Payer: Self-pay | Admitting: Urology

## 2022-10-03 ENCOUNTER — Ambulatory Visit (INDEPENDENT_AMBULATORY_CARE_PROVIDER_SITE_OTHER): Payer: PPO | Admitting: Urology

## 2022-10-03 VITALS — BP 136/76 | HR 76 | Ht 64.0 in | Wt 160.0 lb

## 2022-10-03 DIAGNOSIS — R319 Hematuria, unspecified: Secondary | ICD-10-CM

## 2022-10-03 DIAGNOSIS — N3946 Mixed incontinence: Secondary | ICD-10-CM | POA: Diagnosis not present

## 2022-10-03 LAB — MICROSCOPIC EXAMINATION: Epithelial Cells (non renal): >10 /hpf — AB (ref 0–10)

## 2022-10-03 LAB — URINALYSIS, COMPLETE
Bilirubin, UA: NEGATIVE
Glucose, UA: NEGATIVE
Ketones, UA: NEGATIVE
Nitrite, UA: NEGATIVE
Protein,UA: NEGATIVE
Specific Gravity, UA: 1.025 (ref 1.005–1.030)
Urobilinogen, Ur: 0.2 mg/dL (ref 0.2–1.0)
pH, UA: 5 (ref 5.0–7.5)

## 2022-10-03 LAB — BLADDER SCAN AMB NON-IMAGING: Scan Result: 32

## 2022-10-06 LAB — CULTURE, URINE COMPREHENSIVE

## 2022-10-08 ENCOUNTER — Other Ambulatory Visit: Payer: Self-pay

## 2022-10-08 DIAGNOSIS — N3946 Mixed incontinence: Secondary | ICD-10-CM

## 2022-10-08 DIAGNOSIS — R319 Hematuria, unspecified: Secondary | ICD-10-CM

## 2022-10-10 ENCOUNTER — Other Ambulatory Visit: Payer: Self-pay | Admitting: *Deleted

## 2022-10-10 ENCOUNTER — Other Ambulatory Visit: Payer: PPO

## 2022-10-10 DIAGNOSIS — N329 Bladder disorder, unspecified: Secondary | ICD-10-CM

## 2022-10-10 NOTE — Addendum Note (Signed)
Addended by: Chrystie Nose on: 10/10/2022 10:37 AM   Modules accepted: Orders

## 2022-10-11 LAB — CYTOLOGY - NON PAP

## 2022-10-18 ENCOUNTER — Telehealth: Payer: Self-pay | Admitting: *Deleted

## 2022-10-18 NOTE — Telephone Encounter (Signed)
Notified patient as instructed,.  

## 2022-10-18 NOTE — Telephone Encounter (Signed)
-----   Message from Nori Riis, PA-C sent at 10/18/2022  1:09 PM EST ----- Please let Mrs. Magnussen know that her urine cytology was negative, but I would like to speak with Dr. Erlene Quan regarding whether or not she would recommend repeat imaging.

## 2022-10-27 NOTE — Discharge Instructions (Signed)
Instructions after Total Knee Replacement   Siobhan Zaro P. Classie Weng, Jr., M.D.     Dept. of Orthopaedics & Sports Medicine  [facility]  [address]  [place], [place]  [address]  Phone: [phone]   Fax: [phone]    DIET: Drink plenty of non-alcoholic fluids. Resume your normal diet. Include foods high in fiber.  ACTIVITY:  You may use crutches or a walker with weight-bearing as tolerated, unless instructed otherwise. You may be weaned off of the walker or crutches by your Physical Therapist.  Do NOT place pillows under the knee. Anything placed under the knee could limit your ability to straighten the knee.   Continue doing gentle exercises. Exercising will reduce the pain and swelling, increase motion, and prevent muscle weakness.   Please continue to use the TED compression stockings for 6 weeks. You may remove the stockings at night, but should reapply them in the morning. Do not drive or operate any equipment until instructed.  WOUND CARE:  Continue to use the PolarCare or ice packs periodically to reduce pain and swelling. You may bathe or shower after the staples are removed at the first office visit following surgery.  MEDICATIONS: You may resume your regular medications. Please take the pain medication as prescribed on the medication. Do not take pain medication on an empty stomach. You have been given a prescription for a blood thinner (Lovenox or Coumadin). Please take the medication as instructed. (NOTE: After completing a 2 week course of Lovenox, take one Enteric-coated aspirin once a day. This along with elevation will help reduce the possibility of phlebitis in your operated leg.) Do not drive or drink alcoholic beverages when taking pain medications.  CALL THE OFFICE FOR: Temperature above 101 degrees Excessive bleeding or drainage on the dressing. Excessive swelling, coldness, or paleness of the toes. Persistent nausea and vomiting.  FOLLOW-UP:  You  should have an appointment to return to the office in 10-14 days after surgery. Arrangements have been made for continuation of Physical Therapy (either home therapy or outpatient therapy).   [facility] Department Directory         [email or web address]       [email or web address]          Cardiology  Appointments: [place] - [phone] [place] - [phone]  Endocrinology  Appointments: [place] - [phone] [place] - [phone]  Gastroenterology  Appointments: [place] - [phone] [place] - [phone]        General Surgery   Appointments: [place] - [phone]  Internal Medicine/Family Medicine  Appointments: [place] - [phone] [place] - [phone] [place] - [phone]  Metabolic and Weigh Loss Surgery  Appointments: [place] - [phone]        Neurology  Appointments: [place] - [phone] [place] - [phone]  Neurosurgery  Appointments: [place] - [phone]  Obstetrics & Gynecology  Appointments: [place] - [phone] [place] - [phone]        Pediatrics  Appointments: [place] - [phone] [place] - [phone]  Physiatry  Appointments: [place] -[phone]  Physical Therapy  Appointments: [place] - [phone] [place] - [phone]        Podiatry  Appointments: [place] - [phone] [place] - [phone]  Pulmonology  Appointments: [place] - [phone]  Rheumatology  Appointments: [place] - [phone]         Location: [facility]  [address] , [place]  [address]  [place] Location: [facility] [address] [place], [place]  [address]  [place] Location: [facility] [address] [place], [place]  [address]    

## 2022-10-29 ENCOUNTER — Other Ambulatory Visit: Payer: Self-pay

## 2022-10-29 ENCOUNTER — Inpatient Hospital Stay: Admission: RE | Admit: 2022-10-29 | Payer: PPO | Source: Ambulatory Visit

## 2022-10-29 ENCOUNTER — Encounter
Admission: RE | Admit: 2022-10-29 | Discharge: 2022-10-29 | Disposition: A | Discharge status: Home / Self Care | Payer: PPO | Source: Ambulatory Visit | Attending: Orthopedic Surgery | Admitting: Orthopedic Surgery

## 2022-10-29 VITALS — Ht 64.0 in | Wt 160.0 lb

## 2022-10-29 DIAGNOSIS — Z01818 Encounter for other preprocedural examination: Secondary | ICD-10-CM

## 2022-10-29 NOTE — Patient Instructions (Addendum)
Your procedure is scheduled on: Wednesday 11/07/22 To find out your arrival time, please call [phone] between [place] on:  Tuesday 11/06/22  Report to the Registration Desk on the 1st floor of the [facility]. Valet parking is available.  If your arrival time is 6:00 am, do not arrive before that time as the [place] entrance doors do not open until 6:00 am.  REMEMBER: Instructions that are not followed completely may result in serious medical risk, up to and including death; or upon the discretion of your surgeon and anesthesiologist your surgery may need to be rescheduled.  Do not eat food after midnight the night before surgery.  No gum chewing or hard candies.  You may however, drink CLEAR liquids up to 2 hours before you are scheduled to arrive for your surgery. Do not drink anything within 2 hours of your scheduled arrival time.  Clear liquids include: - water  - apple juice without pulp - gatorade (not RED colors) - black coffee or tea (Do NOT add milk or creamers to the coffee or tea) Do NOT drink anything that is not on this list.  Type 1 and Type 2 diabetics should only drink water.  In addition, your doctor has ordered for you to drink the provided:  Ensure Pre-Surgery Clear Carbohydrate Drink  Drinking this carbohydrate drink up to two hours before surgery helps to reduce insulin resistance and improve patient outcomes. Please complete drinking 2 hours before scheduled arrival time.  One week prior to surgery: Stop Anti-inflammatories (NSAIDS) such as Advil, Aleve, Ibuprofen, Motrin, Naproxen, Naprosyn and Aspirin based products such as Excedrin, Goody's Powder, BC Powder. You may however, continue to take Tylenol if needed for pain up until the day of surgery.  Stop ANY OVER THE COUNTER supplements until after surgery.  Continue taking all prescribed medications.  TAKE ONLY THESE MEDICATIONS THE MORNING OF SURGERY WITH A SIP OF WATER:  amLODipine  (NORVASC) 2.5 MG tablet  levothyroxine (SYNTHROID) 50 MCG tablet  metoprolol tartrate (LOPRESSOR) 50 MG tablet  sertraline (ZOLOFT) 50 MG tablet   Use inhalers on the day of surgery and bring to the hospital.  No Alcohol for 24 hours before or after surgery.  No Smoking including e-cigarettes for 24 hours before surgery.  No chewable tobacco products for at least 6 hours before surgery.  No nicotine patches on the day of surgery.  Do not use any "recreational" drugs for at least a week (preferably 2 weeks) before your surgery.  Please be advised that the combination of cocaine and anesthesia may have negative outcomes, up to and including death. If you test positive for cocaine, your surgery will be cancelled.  On the morning of surgery brush your teeth with toothpaste and water, you may rinse your mouth with mouthwash if you wish. Do not swallow any toothpaste or mouthwash.  Use CHG Soap or wipes as directed on instruction sheet.  Do not wear lotions, powders, or perfumes. You can use deodorant.  Do not shave body hair from the neck down 48 hours before surgery.  Wear comfortable clothing (specific to your surgery type) to the hospital.  Do not wear jewelry, make-up, hairpins, clips or nail polish.  Contact lenses, hearing aids and dentures may not be worn into surgery.  Do not bring valuables to the hospital. [facility] is not responsible for any missing/lost belongings or valuables.   Notify your doctor if there is any change in your medical condition (cold, fever, infection).  If you are being discharged the day of surgery, you will not be allowed to drive home. You will need a responsible individual to drive you home and stay with you for 24 hours after surgery.   If you are taking public transportation, you will need to have a responsible individual with you.  If you are being admitted to the hospital overnight, leave your suitcase in the car. After surgery it may be  brought to your room.  In case of increased patient census, it may be necessary for you, the patient, to continue your postoperative care in the Same Day Surgery department.  After surgery, you can help prevent lung complications by doing breathing exercises.  Take deep breaths and cough every 1-2 hours. Your doctor may order a device called an Incentive Spirometer to help you take deep breaths. When coughing or sneezing, hold a pillow firmly against your incision with both hands. This is called "splinting." Doing this helps protect your incision. It also decreases belly discomfort.  Surgery Visitation Policy:  Patients undergoing a surgery or procedure may have two family members or support persons with them as long as the person is not COVID-19 positive or experiencing its symptoms.   Inpatient Visitation:    Visiting hours are 7 a.m. to 8 p.m. Up to four visitors are allowed at one time in a patient room. The visitors may rotate out with other people during the day. One designated support person (adult) may remain overnight.  Due to an increase in RSV and influenza rates and associated hospitalizations, children ages 27 and under will not be able to visit patients in [facility]. Masks continue to be strongly recommended.  Please call the [place] Dept. at [phone] if you have any questions about these instructions.     Preparing for Surgery with CHLORHEXIDINE GLUCONATE (CHG) Soap  Chlorhexidine Gluconate (CHG) Soap  o An antiseptic cleaner that kills germs and bonds with the skin to continue killing germs even after washing  o Used for showering the night before surgery and morning of surgery  Before surgery, you can play an important role by reducing the number of germs on your skin.  CHG (Chlorhexidine gluconate) soap is an antiseptic cleanser which kills germs and bonds with the skin to continue killing germs even after washing.  Please do not  use if you have an allergy to CHG or antibacterial soaps. If your skin becomes reddened/irritated stop using the CHG.  1. Shower the NIGHT BEFORE SURGERY and the MORNING OF SURGERY with CHG soap.  2. If you choose to wash your hair, wash your hair first as usual with your normal shampoo.  3. After shampooing, rinse your hair and body thoroughly to remove the shampoo.  4. Use CHG as you would any other liquid soap. You can apply CHG directly to the skin and wash gently with a scrungie or a clean washcloth.  5. Apply the CHG soap to your body only from the neck down. Do not use on open wounds or open sores. Avoid contact with your eyes, ears, mouth, and genitals (private parts). Wash face and genitals (private parts) with your normal soap.  6. Wash thoroughly, paying special attention to the area where your surgery will be performed.  7. Thoroughly rinse your body with warm water.  8. Do not shower/wash with your normal soap after using and rinsing off the CHG soap.  9. Pat yourself dry with a clean towel.  10. Wear clean  pajamas to bed the night before surgery.  12. Place clean sheets on your bed the night of your first shower and do not sleep with pets.  13. Shower again with the CHG soap on the day of surgery prior to arriving at the hospital.  14. Do not apply any deodorants/lotions/powders.  15. Please wear clean clothes to the hospital.  How to Use an Incentive Spirometer  An incentive spirometer is a tool that measures how well you are filling your lungs with each breath. Learning to take long, deep breaths using this tool can help you keep your lungs clear and active. This may help to reverse or lessen your chance of developing breathing (pulmonary) problems, especially infection. You may be asked to use a spirometer: After a surgery. If you have a lung problem or a history of smoking. After a long period of time when you have been unable to move or be active. If the  spirometer includes an indicator to show the highest number that you have reached, your health care provider or respiratory therapist will help you set a goal. Keep a log of your progress as told by your health care provider. What are the risks? Breathing too quickly may cause dizziness or cause you to pass out. Take your time so you do not get dizzy or light-headed. If you are in pain, you may need to take pain medicine before doing incentive spirometry. It is harder to take a deep breath if you are having pain. How to use your incentive spirometer  Sit up on the edge of your bed or on a chair. Hold the incentive spirometer so that it is in an upright position. Before you use the spirometer, breathe out normally. Place the mouthpiece in your mouth. Make sure your lips are closed tightly around it. Breathe in slowly and as deeply as you can through your mouth, causing the piston or the ball to rise toward the top of the chamber. Hold your breath for 3-5 seconds, or for as long as possible. If the spirometer includes a coach indicator, use this to guide you in breathing. Slow down your breathing if the indicator goes above the marked areas. Remove the mouthpiece from your mouth and breathe out normally. The piston or ball will return to the bottom of the chamber. Rest for a few seconds, then repeat the steps 10 or more times. Take your time and take a few normal breaths between deep breaths so that you do not get dizzy or light-headed. Do this every 1-2 hours when you are awake. If the spirometer includes a goal marker to show the highest number you have reached (best effort), use this as a goal to work toward during each repetition. After each set of 10 deep breaths, cough a few times. This will help to make sure that your lungs are clear. If you have an incision on your chest or abdomen from surgery, place a pillow or a rolled-up towel firmly against the incision when you cough. This can help to  reduce pain while taking deep breaths and coughing. General tips When you are able to get out of bed: Walk around often. Continue to take deep breaths and cough in order to clear your lungs. Keep using the incentive spirometer until your health care provider says it is okay to stop using it. If you have been in the hospital, you may be told to keep using the spirometer at home. Contact a health care provider if: You  are having difficulty using the spirometer. You have trouble using the spirometer as often as instructed. Your pain medicine is not giving enough relief for you to use the spirometer as told. You have a fever. Get help right away if: You develop shortness of breath. You develop a cough with bloody mucus from the lungs. You have fluid or blood coming from an incision site after you cough. Summary An incentive spirometer is a tool that can help you learn to take long, deep breaths to keep your lungs clear and active. You may be asked to use a spirometer after a surgery, if you have a lung problem or a history of smoking, or if you have been inactive for a long period of time. Use your incentive spirometer as instructed every 1-2 hours while you are awake. If you have an incision on your chest or abdomen, place a pillow or a rolled-up towel firmly against your incision when you cough. This will help to reduce pain. Get help right away if you have shortness of breath, you cough up bloody mucus, or blood comes from your incision when you cough. This information is not intended to replace advice given to you by your health care provider. Make sure you discuss any questions you have with your health care provider. Document Revised: 11/02/2019 Document Reviewed: 11/02/2019 Elsevier Patient Education  [employer].    Preoperative Educational Videos for Total Hip, Knee and Shoulder Replacements  To better prepare for surgery, please view our videos that explain the physical  activity and discharge planning required to have the best surgical recovery at [facility].  [email or web address]  Questions? Call [phone] or email [email or web address]

## 2022-10-31 ENCOUNTER — Encounter
Admission: RE | Admit: 2022-10-31 | Discharge: 2022-10-31 | Disposition: A | Discharge status: Home / Self Care | Payer: PPO | Source: Ambulatory Visit | Attending: Orthopedic Surgery | Admitting: Orthopedic Surgery

## 2022-10-31 DIAGNOSIS — Z01818 Encounter for other preprocedural examination: Secondary | ICD-10-CM | POA: Insufficient documentation

## 2022-10-31 DIAGNOSIS — R319 Hematuria, unspecified: Secondary | ICD-10-CM | POA: Insufficient documentation

## 2022-10-31 DIAGNOSIS — Z91040 Latex allergy status: Secondary | ICD-10-CM | POA: Diagnosis not present

## 2022-10-31 DIAGNOSIS — Z01810 Encounter for preprocedural cardiovascular examination: Secondary | ICD-10-CM | POA: Diagnosis not present

## 2022-10-31 DIAGNOSIS — M1712 Unilateral primary osteoarthritis, left knee: Secondary | ICD-10-CM

## 2022-10-31 DIAGNOSIS — R300 Dysuria: Secondary | ICD-10-CM | POA: Diagnosis not present

## 2022-10-31 DIAGNOSIS — Z01812 Encounter for preprocedural laboratory examination: Secondary | ICD-10-CM

## 2022-10-31 LAB — SURGICAL PCR SCREEN
MRSA, PCR: NEGATIVE
Staphylococcus aureus: NEGATIVE

## 2022-10-31 LAB — URINALYSIS, ROUTINE W REFLEX MICROSCOPIC
Bacteria, UA: NONE SEEN
Bilirubin Urine: NEGATIVE
Glucose, UA: NEGATIVE mg/dL
Ketones, ur: NEGATIVE mg/dL
Leukocytes,Ua: NEGATIVE
Nitrite: NEGATIVE
Protein, ur: NEGATIVE mg/dL
Specific Gravity, Urine: 1.018 (ref 1.005–1.030)
pH: 6 (ref 5.0–8.0)

## 2022-10-31 LAB — SEDIMENTATION RATE: Sed Rate: 17 mm/hr (ref 0–30)

## 2022-10-31 LAB — COMPREHENSIVE METABOLIC PANEL
ALT: 19 U/L (ref 0–44)
AST: 26 U/L (ref 15–41)
Albumin: 3.9 g/dL (ref 3.5–5.0)
Alkaline Phosphatase: 64 U/L (ref 38–126)
Anion gap: 5 (ref 5–15)
BUN: 18 mg/dL (ref 8–23)
CO2: 27 mmol/L (ref 22–32)
Calcium: 8.8 mg/dL — ABNORMAL LOW (ref 8.9–10.3)
Chloride: 110 mmol/L (ref 98–111)
Creatinine, Ser: 0.86 mg/dL (ref 0.44–1.00)
GFR, Estimated: >60 mL/min (ref >60)
Glucose, Bld: 115 mg/dL — ABNORMAL HIGH (ref 70–99)
Potassium: 3.7 mmol/L (ref 3.5–5.1)
Sodium: 142 mmol/L (ref 135–145)
Total Bilirubin: 0.4 mg/dL (ref 0.3–1.2)
Total Protein: 7 g/dL (ref 6.5–8.1)

## 2022-10-31 LAB — CBC
HCT: 41.3 % (ref 36.0–46.0)
Hemoglobin: 13.8 g/dL (ref 12.0–15.0)
MCH: 31.9 pg (ref 26.0–34.0)
MCHC: 33.4 g/dL (ref 30.0–36.0)
MCV: 95.4 fL (ref 80.0–100.0)
Platelets: 287 10*3/uL (ref 150–400)
RBC: 4.33 MIL/uL (ref 3.87–5.11)
RDW: 14.2 % (ref 11.5–15.5)
WBC: 6.4 10*3/uL (ref 4.0–10.5)
nRBC: 0 % (ref 0.0–0.2)

## 2022-10-31 LAB — C-REACTIVE PROTEIN: CRP: 1.3 mg/dL — ABNORMAL HIGH (ref <1.0)

## 2022-10-31 LAB — TYPE AND SCREEN
ABO/RH(D): A POS
Antibody Screen: NEGATIVE

## 2022-11-01 ENCOUNTER — Other Ambulatory Visit: Payer: Self-pay | Admitting: Nurse Practitioner

## 2022-11-01 DIAGNOSIS — F411 Generalized anxiety disorder: Secondary | ICD-10-CM

## 2022-11-04 NOTE — H&P (Signed)
ORTHOPAEDIC HISTORY & PHYSICAL Regino Bellow, PA - 10/31/2022 8:30 AM EST Formatting of this note is different from the original. Images from the original note were not included. Chief Complaint Chief Complaint Patient presents with Knee Pain H & P LEFT KNEE  Reason for Visit Wendy Garza is a 77 y.o. who presents today for a history and physical. She is to undergo a left total knee arthroplasty on 11/07/2022. Since her last visit here to clinic there have been no improvement in her condition. Patient wishes to proceed with having total knee arthroplasty.  She reports a greater than 10 year history of left knee pain. She localizes most of the pain along the medial aspect of the knee. She reports no swelling, no locking, and some giving way of the knee. The pain is aggravated by any weight bearing. The knee pain limits the patient's ability to ambulate long distances. The patient has not appreciated any significant improvement despite NSAIDs and activity modification. She is not using any ambulatory aids. The patient states that the left knee pain has progressed to the point that it is significantly interfering with her activities of daily living.  Of note, she does report a several month history of right knee pain. She localizes the pain along the medial aspect of the left knee. The right knee symptoms are not as severe as the left knee.  Past Medical History Past Medical History: Diagnosis Date Asthma without status asthmaticus Hyperlipidemia  Past Surgical History Past Surgical History: Procedure Laterality Date COLONOSCOPY 04/07/2002 Adenomatous Polyp COLONOSCOPY 10/02/2010 PH Adenomatous Polyp: CBF 09/2015 COLONOSCOPY 05/28/2016 Adenomatous Polyp: CBF 05/2021 HYSTERECTOMY  Past Family History Family History Problem Relation Age of Onset No Known Problems Mother Stomach cancer Father Hyperlipidemia (Elevated cholesterol) Sister Hyperlipidemia (Elevated cholesterol)  Brother  Medications Current Outpatient Medications Ordered in Epic Medication Sig Dispense Refill acetaminophen (TYLENOL) 500 MG tablet Take 1,000 mg by mouth every 6 (six) hours as needed for Pain albuterol 90 mcg/actuation inhaler Inhale 2 inhalations into the lungs every 6 (six) hours as needed FOR WHEEZING OR SHORTNESS OF BREATH amLODIPine (NORVASC) 2.5 MG tablet Take 1 tablet by mouth once daily bismuth subsalicylate (PEPTO-BISMOL) 262 mg Tab Take 1 tablet by mouth as needed carboxymethylcellulose sodium (ARTIFICIAL TEARS, CMC, OPHTH) Apply 2 drops to eye once daily as needed cholecalciferol 1000 unit tablet Take 1 tablet by mouth once daily ibuprofen (MOTRIN) 200 MG tablet Take 600 mg by mouth every 8 (eight) hours as needed FOR PAIN levothyroxine (SYNTHROID) 50 MCG tablet Take 50 mcg by mouth every morning before breakfast (123XX123) methyl salicylate-menthol (ICY HOT) 30-10 % Stck Apply topically as needed metoprolol tartrate (LOPRESSOR) 50 MG tablet Take 25 mg by mouth 2 (two) times daily. mirabegron (MYRBETRIQ) 25 mg ER Tablet Take 1 tablet by mouth once daily rosuvastatin (CRESTOR) 20 MG tablet Take by mouth once daily. sertraline (ZOLOFT) 50 MG tablet Take by mouth once daily. SYMBICORT 80-4.5 mcg/actuation inhaler Inhale 2 inhalations into the lungs 2 (two) times daily albuterol 90 mcg/actuation inhaler Inhale into the lungs once daily as needed. SYMBICORT 160-4.5 mcg/actuation inhaler TAKE 2 PUFFS BY MOUTH TWICE A DAY 2  No current Epic-ordered facility-administered medications on file.  Allergies Allergies Allergen Reactions Aspirin Hives and Other (See Comments) Patient cannot recall what happened Shellfish Containing Products Hives Latex Rash Nickel Rash   Review of Systems A comprehensive 14 point ROS was performed, reviewed, and the pertinent orthopaedic findings are documented in the HPI.  Exam BP 130/82 (BP  Location: Left upper arm, Patient Position: Sitting,  BP Cuff Size: Adult)  Ht 162.6 cm ('5\' 4"'$ )  Wt 69.9 kg (154 lb)  BMI 26.43 kg/m  General: Well-developed well-nourished female seen in no acute distress.  HEENT: Atraumatic,normocephalic. Pupils are equal and reactive to light. Oropharynx is clear with moist mucosa  Lungs: Clear to auscultation bilaterally  Cardiovascular: Regular rate and rhythm. Normal S1, S2. No murmurs. No appreciable gallops or rubs. Peripheral pulses are palpable.  Abdomen: Soft, non-tender, nondistended. Bowel sounds present  Extremity: Left Knee: Soft tissue swelling: none Effusion: none Erythema: none Crepitance: mild Tenderness: medial Alignment: relative varus Mediolateral laxity: medial pseudolaxity Posterior sag: negative Patellar tracking: Good tracking without evidence of subluxation or tilt Atrophy: No significant atrophy. Quadriceps tone was fair to good. Range of motion: 0/3/128 degrees  Neurological:  The patient is alert and oriented Sensation to light touch appears to be intact and within normal limits Gross motor strength appeared to be equal to 5/5  Vascular :  Peripheral pulses felt to be palpable. Capillary refill appears to be intact and within normal limits  X-ray  1. AP standing, lateral and sunrise view of the left knee ordered and interpreted on today's visit shows significant narrowing of the medial cartilage space with bone-on-bone being noted. She is noted to have increased varus deformity. Osteophytes as well as subchondral porosis is noted. Patella tracking well. No evidence of any fractures or dislocation.  Impression  1. Degenerative arthrosis left knee  Plan  1. I have gone over the patient's medication on today's visit. She has already discontinued her ibuprofen. Has no hypoglycemic medication. 2. Past medical history reviewed 3. Postop rehab course was discussed at great length 4. Return to clinic 2 weeks postop. Sooner if any problems  This note was  generated in part with voice recognition software and I apologize for any typographical errors that were not detected and corrected   Watt Climes PA Electronically signed by Regino Bellow, PA at 10/31/2022 12:10 PM EST

## 2022-11-05 ENCOUNTER — Encounter: Payer: Self-pay | Admitting: Orthopedic Surgery

## 2022-11-05 LAB — LATEX, IGE: Latex: <0.1 kU/L

## 2022-11-06 ENCOUNTER — Encounter: Payer: Self-pay | Admitting: Orthopedic Surgery

## 2022-11-06 MED ORDER — CHLORHEXIDINE GLUCONATE 0.12 % MT SOLN: 15.0000 mL | Freq: Once | OROMUCOSAL | Status: DC

## 2022-11-06 MED ORDER — CELECOXIB 200 MG PO CAPS: 400.0000 mg | ORAL_CAPSULE | Freq: Once | ORAL | Status: AC

## 2022-11-06 MED ORDER — GABAPENTIN 300 MG PO CAPS: 300.0000 mg | ORAL_CAPSULE | Freq: Once | ORAL | Status: AC

## 2022-11-06 MED ORDER — LACTATED RINGERS IV SOLN: INTRAVENOUS | Status: DC

## 2022-11-06 MED ORDER — ORAL CARE MOUTH RINSE: 15.0000 mL | Freq: Once | OROMUCOSAL | Status: DC

## 2022-11-06 MED ORDER — DEXAMETHASONE SODIUM PHOSPHATE 10 MG/ML IJ SOLN: 8.0000 mg | Freq: Once | INTRAMUSCULAR | Status: AC

## 2022-11-06 MED ORDER — CHLORHEXIDINE GLUCONATE 4 % EX LIQD: 60.0000 mL | Freq: Once | CUTANEOUS | Status: DC

## 2022-11-06 MED ORDER — CEFAZOLIN SODIUM-DEXTROSE 2-4 GM/100ML-% IV SOLN
2.0000 g | INTRAVENOUS | Status: AC
  Administered 2022-11-07: 2 g via INTRAVENOUS

## 2022-11-06 MED ORDER — FAMOTIDINE 20 MG PO TABS
20.0000 mg | ORAL_TABLET | Freq: Once | ORAL | Status: AC
  Administered 2022-11-07: 20 mg via ORAL

## 2022-11-06 MED ORDER — TRANEXAMIC ACID-NACL 1000-0.7 MG/100ML-% IV SOLN
1000.0000 mg | INTRAVENOUS | Status: AC
  Administered 2022-11-07: 1000 mg via INTRAVENOUS

## 2022-11-07 ENCOUNTER — Other Ambulatory Visit: Payer: Self-pay

## 2022-11-07 ENCOUNTER — Ambulatory Visit: Payer: PPO | Admitting: Urgent Care

## 2022-11-07 ENCOUNTER — Ambulatory Visit: Payer: PPO | Admitting: Certified Registered Nurse Anesthetist

## 2022-11-07 ENCOUNTER — Encounter
Admission: RE | Disposition: A | Discharge status: Home / Self Care | Payer: Self-pay | Source: Home / Self Care | Attending: Orthopedic Surgery

## 2022-11-07 ENCOUNTER — Observation Stay
Admission: RE | Admit: 2022-11-07 | Discharge: 2022-11-08 | Disposition: A | Discharge status: Home / Self Care | Payer: PPO | Attending: Orthopedic Surgery | Admitting: Orthopedic Surgery

## 2022-11-07 ENCOUNTER — Other Ambulatory Visit: Payer: Self-pay | Admitting: Nurse Practitioner

## 2022-11-07 ENCOUNTER — Observation Stay: Payer: PPO

## 2022-11-07 ENCOUNTER — Encounter: Payer: Self-pay | Admitting: Orthopedic Surgery

## 2022-11-07 DIAGNOSIS — I10 Essential (primary) hypertension: Secondary | ICD-10-CM

## 2022-11-07 DIAGNOSIS — Z79899 Other long term (current) drug therapy: Secondary | ICD-10-CM | POA: Diagnosis not present

## 2022-11-07 DIAGNOSIS — J449 Chronic obstructive pulmonary disease, unspecified: Secondary | ICD-10-CM | POA: Diagnosis not present

## 2022-11-07 DIAGNOSIS — Z96659 Presence of unspecified artificial knee joint: Secondary | ICD-10-CM | POA: Diagnosis not present

## 2022-11-07 DIAGNOSIS — Z96652 Presence of left artificial knee joint: Secondary | ICD-10-CM | POA: Diagnosis not present

## 2022-11-07 DIAGNOSIS — Z8542 Personal history of malignant neoplasm of other parts of uterus: Secondary | ICD-10-CM | POA: Diagnosis not present

## 2022-11-07 DIAGNOSIS — R319 Hematuria, unspecified: Secondary | ICD-10-CM

## 2022-11-07 DIAGNOSIS — M1712 Unilateral primary osteoarthritis, left knee: Principal | ICD-10-CM | POA: Insufficient documentation

## 2022-11-07 DIAGNOSIS — Z91040 Latex allergy status: Secondary | ICD-10-CM | POA: Insufficient documentation

## 2022-11-07 DIAGNOSIS — R300 Dysuria: Secondary | ICD-10-CM

## 2022-11-07 DIAGNOSIS — J45909 Unspecified asthma, uncomplicated: Secondary | ICD-10-CM | POA: Diagnosis not present

## 2022-11-07 DIAGNOSIS — Z419 Encounter for procedure for purposes other than remedying health state, unspecified: Secondary | ICD-10-CM

## 2022-11-07 DIAGNOSIS — Z471 Aftercare following joint replacement surgery: Secondary | ICD-10-CM | POA: Diagnosis not present

## 2022-11-07 HISTORY — PX: KNEE ARTHROPLASTY: SHX992

## 2022-11-07 LAB — ABO/RH: ABO/RH(D): A POS

## 2022-11-07 SURGERY — ARTHROPLASTY, KNEE, TOTAL, USING IMAGELESS COMPUTER-ASSISTED NAVIGATION
Anesthesia: Spinal | Site: Knee | Laterality: Left

## 2022-11-07 MED ORDER — TRANEXAMIC ACID-NACL 1000-0.7 MG/100ML-% IV SOLN
INTRAVENOUS | Status: AC
  Filled 2022-11-07: qty 100

## 2022-11-07 MED ORDER — FENTANYL CITRATE (PF) 250 MCG/5ML IJ SOLN
INTRAMUSCULAR | Status: AC
  Filled 2022-11-07: qty 5

## 2022-11-07 MED ORDER — MAGNESIUM HYDROXIDE 400 MG/5ML PO SUSP: 30.0000 mL | Freq: Every day | ORAL | Status: DC

## 2022-11-07 MED ORDER — ACETAMINOPHEN 325 MG PO TABS: 325.0000 mg | ORAL_TABLET | Freq: Four times a day (QID) | ORAL | Status: DC | PRN

## 2022-11-07 MED ORDER — OXYCODONE HCL 5 MG PO TABS: 10.0000 mg | ORAL_TABLET | ORAL | Status: DC | PRN

## 2022-11-07 MED ORDER — ALBUTEROL SULFATE (2.5 MG/3ML) 0.083% IN NEBU: 3.0000 mL | INHALATION_SOLUTION | Freq: Four times a day (QID) | RESPIRATORY_TRACT | Status: DC | PRN

## 2022-11-07 MED ORDER — PHENYLEPHRINE HCL-NACL 20-0.9 MG/250ML-% IV SOLN
INTRAVENOUS | Status: AC
  Filled 2022-11-07: qty 250

## 2022-11-07 MED ORDER — ACETAMINOPHEN 10 MG/ML IV SOLN
INTRAVENOUS | Status: AC
  Filled 2022-11-07: qty 100

## 2022-11-07 MED ORDER — AMLODIPINE BESYLATE 5 MG PO TABS
2.5000 mg | ORAL_TABLET | Freq: Every day | ORAL | Status: DC
  Administered 2022-11-08: 2.5 mg via ORAL

## 2022-11-07 MED ORDER — METOPROLOL TARTRATE 25 MG PO TABS
ORAL_TABLET | ORAL | Status: AC
  Filled 2022-11-07: qty 1

## 2022-11-07 MED ORDER — SENNOSIDES-DOCUSATE SODIUM 8.6-50 MG PO TABS
ORAL_TABLET | ORAL | Status: AC
  Filled 2022-11-07: qty 1

## 2022-11-07 MED ORDER — CHLORHEXIDINE GLUCONATE 0.12 % MT SOLN
OROMUCOSAL | Status: AC
  Administered 2022-11-07: 15 mL via OROMUCOSAL
  Filled 2022-11-07: qty 15

## 2022-11-07 MED ORDER — ROSUVASTATIN CALCIUM 20 MG PO TABS
ORAL_TABLET | ORAL | Status: AC
  Administered 2022-11-07: 20 mg
  Filled 2022-11-07: qty 1

## 2022-11-07 MED ORDER — SODIUM CHLORIDE 0.9 % IR SOLN
Status: DC | PRN
  Administered 2022-11-07: 3000 mL

## 2022-11-07 MED ORDER — BISACODYL 10 MG RE SUPP: 10.0000 mg | Freq: Every day | RECTAL | Status: DC | PRN

## 2022-11-07 MED ORDER — FENTANYL CITRATE (PF) 100 MCG/2ML IJ SOLN
INTRAMUSCULAR | Status: DC | PRN
  Administered 2022-11-07 (×2): 25 ug via INTRAVENOUS

## 2022-11-07 MED ORDER — SODIUM CHLORIDE (PF) 0.9 % IJ SOLN
INTRAMUSCULAR | Status: DC | PRN
  Administered 2022-11-07: 120 mL via INTRAMUSCULAR

## 2022-11-07 MED ORDER — DIPHENHYDRAMINE HCL 12.5 MG/5ML PO ELIX: 12.5000 mg | ORAL_SOLUTION | ORAL | Status: DC | PRN

## 2022-11-07 MED ORDER — SURGIPHOR WOUND IRRIGATION SYSTEM - OPTIME: TOPICAL | Status: DC | PRN

## 2022-11-07 MED ORDER — SERTRALINE HCL 50 MG PO TABS: 50.0000 mg | ORAL_TABLET | Freq: Every day | ORAL | Status: DC

## 2022-11-07 MED ORDER — GLYCOPYRROLATE 0.2 MG/ML IJ SOLN
INTRAMUSCULAR | Status: AC
  Filled 2022-11-07: qty 1

## 2022-11-07 MED ORDER — OXYCODONE HCL 5 MG PO TABS
5.0000 mg | ORAL_TABLET | ORAL | Status: DC | PRN
  Administered 2022-11-08: 5 mg via ORAL

## 2022-11-07 MED ORDER — FENTANYL CITRATE (PF) 100 MCG/2ML IJ SOLN: 25.0000 ug | INTRAMUSCULAR | Status: DC | PRN

## 2022-11-07 MED ORDER — ACETAMINOPHEN 10 MG/ML IV SOLN
INTRAVENOUS | Status: AC
  Administered 2022-11-07: 1000 mg via INTRAVENOUS
  Filled 2022-11-07: qty 100

## 2022-11-07 MED ORDER — ENOXAPARIN SODIUM 30 MG/0.3ML IJ SOSY: 30.0000 mg | PREFILLED_SYRINGE | Freq: Two times a day (BID) | INTRAMUSCULAR | Status: DC

## 2022-11-07 MED ORDER — SENNOSIDES-DOCUSATE SODIUM 8.6-50 MG PO TABS
1.0000 | ORAL_TABLET | Freq: Two times a day (BID) | ORAL | Status: DC
  Administered 2022-11-07 (×2): 1 via ORAL

## 2022-11-07 MED ORDER — MIDAZOLAM HCL 2 MG/2ML IJ SOLN
INTRAMUSCULAR | Status: AC
  Filled 2022-11-07: qty 2

## 2022-11-07 MED ORDER — CEFAZOLIN SODIUM-DEXTROSE 2-4 GM/100ML-% IV SOLN
INTRAVENOUS | Status: AC
  Filled 2022-11-07: qty 100

## 2022-11-07 MED ORDER — TRAMADOL HCL 50 MG PO TABS: 50.0000 mg | ORAL_TABLET | ORAL | Status: DC | PRN

## 2022-11-07 MED ORDER — ROSUVASTATIN CALCIUM 20 MG PO TABS: 20.0000 mg | ORAL_TABLET | Freq: Every day | ORAL | Status: DC

## 2022-11-07 MED ORDER — FENTANYL CITRATE (PF) 100 MCG/2ML IJ SOLN
INTRAMUSCULAR | Status: AC
  Filled 2022-11-07: qty 2

## 2022-11-07 MED ORDER — FAMOTIDINE 20 MG PO TABS
ORAL_TABLET | ORAL | Status: AC
  Filled 2022-11-07: qty 1

## 2022-11-07 MED ORDER — FLEET ENEMA 7-19 GM/118ML RE ENEM: 1.0000 | ENEMA | Freq: Once | RECTAL | Status: DC | PRN

## 2022-11-07 MED ORDER — PROPOFOL 500 MG/50ML IV EMUL
INTRAVENOUS | Status: DC | PRN
  Administered 2022-11-07: 50 ug/kg/min via INTRAVENOUS

## 2022-11-07 MED ORDER — DEXAMETHASONE SODIUM PHOSPHATE 10 MG/ML IJ SOLN
INTRAMUSCULAR | Status: AC
  Administered 2022-11-07: 8 mg via INTRAVENOUS
  Filled 2022-11-07: qty 1

## 2022-11-07 MED ORDER — MOMETASONE FURO-FORMOTEROL FUM 100-5 MCG/ACT IN AERO: 2.0000 | INHALATION_SPRAY | Freq: Two times a day (BID) | RESPIRATORY_TRACT | Status: DC

## 2022-11-07 MED ORDER — PROPOFOL 1000 MG/100ML IV EMUL
INTRAVENOUS | Status: AC
  Filled 2022-11-07: qty 100

## 2022-11-07 MED ORDER — SODIUM CHLORIDE FLUSH 0.9 % IV SOLN
INTRAVENOUS | Status: AC
  Filled 2022-11-07: qty 80

## 2022-11-07 MED ORDER — TRANEXAMIC ACID-NACL 1000-0.7 MG/100ML-% IV SOLN
1000.0000 mg | Freq: Once | INTRAVENOUS | Status: AC
  Administered 2022-11-07: 1000 mg via INTRAVENOUS

## 2022-11-07 MED ORDER — BUPIVACAINE HCL (PF) 0.5 % IJ SOLN
INTRAMUSCULAR | Status: AC
  Filled 2022-11-07: qty 10

## 2022-11-07 MED ORDER — ONDANSETRON HCL 4 MG/2ML IJ SOLN: 4.0000 mg | Freq: Four times a day (QID) | INTRAMUSCULAR | Status: DC | PRN

## 2022-11-07 MED ORDER — MIDAZOLAM HCL 5 MG/5ML IJ SOLN
INTRAMUSCULAR | Status: DC | PRN
  Administered 2022-11-07: 1 mg via INTRAVENOUS

## 2022-11-07 MED ORDER — METOCLOPRAMIDE HCL 10 MG PO TABS
10.0000 mg | ORAL_TABLET | Freq: Three times a day (TID) | ORAL | Status: DC
  Administered 2022-11-07: 10 mg via ORAL

## 2022-11-07 MED ORDER — BUPIVACAINE HCL (PF) 0.5 % IJ SOLN
INTRAMUSCULAR | Status: DC | PRN
  Administered 2022-11-07: 3 mL

## 2022-11-07 MED ORDER — FERROUS SULFATE 325 (65 FE) MG PO TABS
ORAL_TABLET | ORAL | Status: AC
  Filled 2022-11-07: qty 1

## 2022-11-07 MED ORDER — OXYCODONE HCL 5 MG PO TABS
ORAL_TABLET | ORAL | Status: AC
  Administered 2022-11-07: 5 mg via ORAL
  Filled 2022-11-07: qty 1

## 2022-11-07 MED ORDER — MENTHOL 3 MG MT LOZG: 1.0000 | LOZENGE | OROMUCOSAL | Status: DC | PRN

## 2022-11-07 MED ORDER — PHENOL 1.4 % MT LIQD: 1.0000 | OROMUCOSAL | Status: DC | PRN

## 2022-11-07 MED ORDER — LEVOTHYROXINE SODIUM 50 MCG PO TABS
50.0000 ug | ORAL_TABLET | Freq: Every day | ORAL | Status: DC
  Administered 2022-11-08: 50 ug via ORAL
  Filled 2022-11-07: qty 1

## 2022-11-07 MED ORDER — CEFAZOLIN SODIUM-DEXTROSE 2-4 GM/100ML-% IV SOLN
INTRAVENOUS | Status: AC
  Administered 2022-11-07: 2 g via INTRAVENOUS
  Filled 2022-11-07: qty 100

## 2022-11-07 MED ORDER — CEFAZOLIN SODIUM-DEXTROSE 2-4 GM/100ML-% IV SOLN
2.0000 g | Freq: Three times a day (TID) | INTRAVENOUS | Status: AC
  Administered 2022-11-07: 2 g via INTRAVENOUS

## 2022-11-07 MED ORDER — ORAL CARE MOUTH RINSE: 15.0000 mL | OROMUCOSAL | Status: DC | PRN

## 2022-11-07 MED ORDER — PANTOPRAZOLE SODIUM 40 MG PO TBEC
DELAYED_RELEASE_TABLET | ORAL | Status: AC
  Administered 2022-11-07: 40 mg
  Filled 2022-11-07: qty 1

## 2022-11-07 MED ORDER — METOPROLOL TARTRATE 25 MG PO TABS
25.0000 mg | ORAL_TABLET | Freq: Two times a day (BID) | ORAL | Status: DC
  Administered 2022-11-08: 25 mg via ORAL

## 2022-11-07 MED ORDER — ACETAMINOPHEN 10 MG/ML IV SOLN
INTRAVENOUS | Status: DC | PRN
  Administered 2022-11-07: 1000 mg via INTRAVENOUS

## 2022-11-07 MED ORDER — PANTOPRAZOLE SODIUM 40 MG PO TBEC: 40.0000 mg | DELAYED_RELEASE_TABLET | Freq: Two times a day (BID) | ORAL | Status: DC

## 2022-11-07 MED ORDER — GABAPENTIN 300 MG PO CAPS
ORAL_CAPSULE | ORAL | Status: AC
  Administered 2022-11-07: 300 mg via ORAL
  Filled 2022-11-07: qty 1

## 2022-11-07 MED ORDER — ONDANSETRON HCL 4 MG PO TABS: 4.0000 mg | ORAL_TABLET | Freq: Four times a day (QID) | ORAL | Status: DC | PRN

## 2022-11-07 MED ORDER — OXYCODONE HCL 5 MG PO TABS
ORAL_TABLET | ORAL | Status: AC
  Administered 2022-11-07: 5 mg
  Filled 2022-11-07: qty 1

## 2022-11-07 MED ORDER — BUPIVACAINE HCL (PF) 0.25 % IJ SOLN
INTRAMUSCULAR | Status: AC
  Filled 2022-11-07: qty 120

## 2022-11-07 MED ORDER — PANTOPRAZOLE SODIUM 40 MG PO TBEC
DELAYED_RELEASE_TABLET | ORAL | Status: AC
  Administered 2022-11-07: 40 mg via ORAL
  Filled 2022-11-07: qty 1

## 2022-11-07 MED ORDER — METOCLOPRAMIDE HCL 10 MG PO TABS
ORAL_TABLET | ORAL | Status: AC
  Filled 2022-11-07: qty 1

## 2022-11-07 MED ORDER — SENNA 8.6 MG PO TABS
ORAL_TABLET | ORAL | Status: AC
  Filled 2022-11-07: qty 1

## 2022-11-07 MED ORDER — METOCLOPRAMIDE HCL 10 MG PO TABS
ORAL_TABLET | ORAL | Status: AC
  Administered 2022-11-07: 10 mg via ORAL
  Filled 2022-11-07: qty 1

## 2022-11-07 MED ORDER — ALUM & MAG HYDROXIDE-SIMETH 200-200-20 MG/5ML PO SUSP: 30.0000 mL | ORAL | Status: DC | PRN

## 2022-11-07 MED ORDER — CELECOXIB 200 MG PO CAPS
ORAL_CAPSULE | ORAL | Status: AC
  Administered 2022-11-07: 200 mg
  Filled 2022-11-07: qty 1

## 2022-11-07 MED ORDER — ACETAMINOPHEN 10 MG/ML IV SOLN
1000.0000 mg | Freq: Four times a day (QID) | INTRAVENOUS | Status: AC
  Administered 2022-11-07 – 2022-11-08 (×2): 1000 mg via INTRAVENOUS

## 2022-11-07 MED ORDER — CELECOXIB 200 MG PO CAPS
ORAL_CAPSULE | ORAL | Status: AC
  Administered 2022-11-07: 400 mg via ORAL
  Filled 2022-11-07: qty 2

## 2022-11-07 MED ORDER — CELECOXIB 200 MG PO CAPS: 200.0000 mg | ORAL_CAPSULE | Freq: Two times a day (BID) | ORAL | Status: DC

## 2022-11-07 MED ORDER — FERROUS SULFATE 325 (65 FE) MG PO TABS
325.0000 mg | ORAL_TABLET | Freq: Two times a day (BID) | ORAL | Status: DC
  Administered 2022-11-07: 325 mg via ORAL

## 2022-11-07 MED ORDER — PHENYLEPHRINE HCL-NACL 20-0.9 MG/250ML-% IV SOLN
INTRAVENOUS | Status: DC | PRN
  Administered 2022-11-07: 15 ug/min via INTRAVENOUS

## 2022-11-07 MED ORDER — SODIUM CHLORIDE 0.9 % IV SOLN: INTRAVENOUS | Status: DC

## 2022-11-07 MED ORDER — ENSURE PRE-SURGERY PO LIQD: 296.0000 mL | Freq: Once | ORAL | Status: DC

## 2022-11-07 MED ORDER — HYDROMORPHONE HCL 1 MG/ML IJ SOLN: 0.5000 mg | INTRAMUSCULAR | Status: DC | PRN

## 2022-11-07 MED ORDER — BUPIVACAINE LIPOSOME 1.3 % IJ SUSP
INTRAMUSCULAR | Status: AC
  Filled 2022-11-07: qty 40

## 2022-11-07 SURGICAL SUPPLY — 70 items
ATTUNE PS FEM LT SZ 3 CEM KNEE (Femur) IMPLANT
ATTUNE PSRP INSR SZ3 7 KNEE (Insert) IMPLANT
BASEPLATE TIBIAL ROTATING SZ 4 (Knees) IMPLANT
BATTERY INSTRU NAVIGATION (MISCELLANEOUS) ×4 IMPLANT
BLADE SAW 70X12.5 (BLADE) ×1 IMPLANT
BLADE SAW 90X13X1.19 OSCILLAT (BLADE) ×1 IMPLANT
BLADE SAW 90X25X1.19 OSCILLAT (BLADE) ×1 IMPLANT
BONE CEMENT GENTAMICIN (Cement) ×2 IMPLANT
BSPLAT TIB 4 CMNT ROT PLAT STR (Knees) ×1 IMPLANT
BTRY SRG DRVR LF (MISCELLANEOUS) ×4
CEMENT BONE GENTAMICIN 40 (Cement) IMPLANT
COOLER POLAR GLACIER W/PUMP (MISCELLANEOUS) ×1 IMPLANT
CUFF TOURN SGL QUICK 24 (TOURNIQUET CUFF)
CUFF TOURN SGL QUICK 34 (TOURNIQUET CUFF)
CUFF TRNQT CYL 24X4X16.5-23 (TOURNIQUET CUFF) IMPLANT
CUFF TRNQT CYL 34X4.125X (TOURNIQUET CUFF) IMPLANT
DRAPE 3/4 80X56 (DRAPES) ×1 IMPLANT
DRAPE INCISE IOBAN 66X45 STRL (DRAPES) IMPLANT
DRSG MEPILEX SACRM 8.7X9.8 (GAUZE/BANDAGES/DRESSINGS) ×1 IMPLANT
DRSG NON-ADHERENT DERMACEA 3X4 (GAUZE/BANDAGES/DRESSINGS) ×1 IMPLANT
DRSG OPSITE POSTOP 4X14 (GAUZE/BANDAGES/DRESSINGS) ×1 IMPLANT
DRSG TEGADERM 4X4.75 (GAUZE/BANDAGES/DRESSINGS) ×1 IMPLANT
DURAPREP 26ML APPLICATOR (WOUND CARE) ×2 IMPLANT
ELECT CAUTERY BLADE 6.4 (BLADE) ×1 IMPLANT
ELECT REM PT RETURN 9FT ADLT (ELECTROSURGICAL) ×1
ELECTRODE REM PT RTRN 9FT ADLT (ELECTROSURGICAL) ×1 IMPLANT
EX-PIN ORTHOLOCK NAV 4X150 (PIN) ×2 IMPLANT
GLOVE BIOGEL M STRL SZ7.5 (GLOVE) ×2 IMPLANT
GLOVE SRG 8 PF TXTR STRL LF DI (GLOVE) ×1 IMPLANT
GLOVE SURG UNDER POLY LF SZ8 (GLOVE) ×1
GOWN STRL REUS W/ TWL LRG LVL3 (GOWN DISPOSABLE) ×1 IMPLANT
GOWN STRL REUS W/TWL LRG LVL3 (GOWN DISPOSABLE) ×1
GOWN TOGA ZIPPER T7+ PEEL AWAY (MISCELLANEOUS) ×1 IMPLANT
HANDLE YANKAUER SUCT OPEN TIP (MISCELLANEOUS) ×1 IMPLANT
HEMOVAC 400CC 10FR (MISCELLANEOUS) ×1 IMPLANT
HOLDER FOLEY CATH W/STRAP (MISCELLANEOUS) ×1 IMPLANT
IV NS IRRIG 3000ML ARTHROMATIC (IV SOLUTION) ×1 IMPLANT
KIT TURNOVER KIT A (KITS) ×1 IMPLANT
KNIFE SCULPS 14X20 (INSTRUMENTS) ×1 IMPLANT
MANIFOLD NEPTUNE II (INSTRUMENTS) ×2 IMPLANT
NDL SPNL 20GX3.5 QUINCKE YW (NEEDLE) ×2 IMPLANT
NEEDLE SPNL 20GX3.5 QUINCKE YW (NEEDLE) ×2 IMPLANT
NS IRRIG 500ML POUR BTL (IV SOLUTION) ×1 IMPLANT
PACK TOTAL KNEE (MISCELLANEOUS) ×1 IMPLANT
PAD ABD DERMACEA PRESS 5X9 (GAUZE/BANDAGES/DRESSINGS) ×2 IMPLANT
PAD WRAPON POLAR KNEE (MISCELLANEOUS) ×1 IMPLANT
PATELLA MEDIAL ATTUN 35MM KNEE (Knees) IMPLANT
PIN DRILL FIX HALF THREAD (BIT) ×2 IMPLANT
PIN FIXATION 1/8DIA X 3INL (PIN) ×1 IMPLANT
PULSAVAC PLUS IRRIG FAN TIP (DISPOSABLE) ×1
SOL PREP PVP 2OZ (MISCELLANEOUS) ×1
SOLUTION IRRIG SURGIPHOR (IV SOLUTION) ×1 IMPLANT
SOLUTION PREP PVP 2OZ (MISCELLANEOUS) ×1 IMPLANT
SPONGE DRAIN TRACH 4X4 STRL 2S (GAUZE/BANDAGES/DRESSINGS) ×1 IMPLANT
STAPLER SKIN PROX 35W (STAPLE) ×1 IMPLANT
STOCKINETTE IMPERV 14X48 (MISCELLANEOUS) ×1 IMPLANT
STRAP TIBIA SHORT (MISCELLANEOUS) ×1 IMPLANT
SUCTION FRAZIER HANDLE 10FR (MISCELLANEOUS) ×1
SUCTION TUBE FRAZIER 10FR DISP (MISCELLANEOUS) ×1 IMPLANT
SUT VIC AB 0 CT1 36 (SUTURE) ×1 IMPLANT
SUT VIC AB 1 CT1 36 (SUTURE) ×2 IMPLANT
SUT VIC AB 2-0 CT2 27 (SUTURE) ×1 IMPLANT
SYR 30ML LL (SYRINGE) ×2 IMPLANT
TIP FAN IRRIG PULSAVAC PLUS (DISPOSABLE) ×1 IMPLANT
TOWEL OR 17X26 4PK STRL BLUE (TOWEL DISPOSABLE) IMPLANT
TOWER CARTRIDGE SMART MIX (DISPOSABLE) ×1 IMPLANT
TRAP FLUID SMOKE EVACUATOR (MISCELLANEOUS) ×1 IMPLANT
TRAY FOLEY MTR SLVR 16FR STAT (SET/KITS/TRAYS/PACK) ×1 IMPLANT
WATER STERILE IRR 1000ML POUR (IV SOLUTION) ×1 IMPLANT
WRAPON POLAR PAD KNEE (MISCELLANEOUS) ×1

## 2022-11-07 NOTE — TOC Progression Note (Signed)
Transition of Care [facility]) - Progression Note    Patient Details  Name: Wendy Garza MRN: [id number] Date of Birth: November 04, 1945  Transition of Care [facility]) CM/SW [place], RN Phone Number: 11/07/2022, 5:13 PM  Clinical Narrative:   I have reached out to [facility] services.  I have informed their team that patient would need PCS services that her [place] provides as a benefit for their patients.    I have emailed Murrell Converse requested information.    Patient will be able to receive 20 hours of PCS services once [facility] confirms with [employer].           Expected Discharge Plan and Services                                               Social Determinants of Health (SDOH) Interventions SDOH Screenings   Food Insecurity: No Food Insecurity (11/07/2022)  Housing: Low Risk  (11/07/2022)  Transportation Needs: No Transportation Needs (11/07/2022)  Utilities: Not At Risk (11/07/2022)  Alcohol Screen: Low Risk  (05/24/2022)  Depression (PHQ2-9): Low Risk  (05/24/2022)  Financial Resource Strain: Low Risk  (05/10/2021)  Tobacco Use: High Risk (11/07/2022)    Readmission Risk Interventions     No data to display

## 2022-11-07 NOTE — Anesthesia Preprocedure Evaluation (Signed)
Anesthesia Evaluation  Patient identified by MRN, date of birth, ID band Patient awake    Reviewed: Allergy & Precautions, NPO status , Patient's Chart, lab work & pertinent test results  History of Anesthesia Complications Negative for: history of anesthetic complications  Airway Mallampati: II  TM Distance: >3 FB Neck ROM: Full    Dental  (+) Dental Advidsory Given   Pulmonary neg shortness of breath, asthma , neg sleep apnea, COPD,  COPD inhaler, neg recent URI, Current Smoker and Patient abstained from smoking.   breath sounds clear to auscultation- rhonchi (-) wheezing      Cardiovascular hypertension, Pt. on medications (-) angina (-) CAD, (-) Past MI, (-) Cardiac Stents and (-) CABG  Rhythm:Regular Rate:Normal - Systolic murmurs and - Diastolic murmurs    Neuro/Psych neg Seizures PSYCHIATRIC DISORDERS Anxiety Depression    negative neurological ROS     GI/Hepatic negative GI ROS, Neg liver ROS,,,  Endo/Other  negative endocrine ROSneg diabetes    Renal/GU negative Renal ROS     Musculoskeletal  (+) Arthritis ,    Abdominal  (+) - obese  Peds  Hematology negative hematology ROS (+)   Anesthesia Other Findings Past Medical History: No date: Arthritis No date: Asthma 2003: Cancer ([place])     Comment:  UTERINE, total Hysterectomy No date: COPD (chronic obstructive pulmonary disease) (HCC) No date: Depression No date: Hyperlipidemia No date: Hypertension No date: Shortness of breath dyspnea     Comment:  DOE   Reproductive/Obstetrics                             Anesthesia Physical Anesthesia Plan  ASA: 3  Anesthesia Plan: Spinal   Post-op Pain Management:    Induction: Intravenous  PONV Risk Score and Plan: 1 and Propofol infusion, TIVA and Treatment may vary due to age or medical condition  Airway Management Planned: Natural Airway and Nasal Cannula  Additional  Equipment:   Intra-op Plan:   Post-operative Plan:   Informed Consent: I have reviewed the patients History and Physical, chart, labs and discussed the procedure including the risks, benefits and alternatives for the proposed anesthesia with the patient or authorized representative who has indicated his/her understanding and acceptance.     Dental advisory given  Plan Discussed with: CRNA and Anesthesiologist  Anesthesia Plan Comments:         Anesthesia Quick Evaluation

## 2022-11-07 NOTE — Anesthesia Procedure Notes (Signed)
Spinal  Patient location during procedure: OR Start time: 11/07/2022 7:24 AM End time: 11/07/2022 7:29 AM Reason for block: surgical anesthesia Staffing Performed: resident/CRNA  Resident/CRNA: Demetrius Charity, CRNA Performed by: Demetrius Charity, CRNA Authorized by: Martha Clan, MD   Preanesthetic Checklist Completed: patient identified, IV checked, site marked, risks and benefits discussed, surgical consent, monitors and equipment checked, pre-op evaluation and timeout performed Spinal Block Patient position: sitting Prep: ChloraPrep Patient monitoring: heart rate, continuous pulse ox, blood pressure and cardiac monitor Approach: midline Location: L3-4 Injection technique: single-shot Needle Needle type: [place] and Introducer  Needle gauge: 25 G Needle length: 9 cm Assessment Sensory level: T10 Events: CSF return Additional Notes Sterile aseptic technique used throughout the procedure.  Negative paresthesia. Negative blood return. Positive free-flowing CSF. Expiration date of kit checked and confirmed. Patient tolerated procedure well, without complications.

## 2022-11-07 NOTE — Evaluation (Signed)
Physical Therapy Evaluation Patient Details Name: Wendy Garza MRN: [id number] DOB: Feb 17, 1946 Today's Date: 11/07/2022  History of Present Illness  Pt is a 77 yo F diagnosed with degenerative arthrosis of the left knee and is s/p elective L TKA.  PMH includes: asthma, COPD, and HTN.   Clinical Impression  Pt was pleasant and motivated to participate during the session and put forth good effort throughout.  Pt required no physical assistance with below functional tasks but did require extra time and effort as well as cuing for general sequencing.  Pt was generally steady with standing activities with no overt LOB or buckling.  Pt tolerated the session well with no adverse symptoms noted other than mod L knee pain.  Of note, pt found on 2LO2/min with SpO2 95% with nursing requesting trial on room air.  Pt performed the majority of the session on room air with SpO2 remaining >/= 92% throughout and with nursing requesting pt be left on RA at end of session.  Pt is expected to make good progress towards goals while in acute care and will benefit from HHPT upon discharge to safely address deficits listed in patient problem list for decreased caregiver assistance and eventual return to PLOF.         Recommendations for follow up therapy are one component of a multi-disciplinary discharge planning process, led by the attending physician.  Recommendations may be updated based on patient status, additional functional criteria and insurance authorization.  Follow Up Recommendations Home health PT      Assistance Recommended at Discharge Intermittent Supervision/Assistance  Patient can return home with the following  A little help with walking and/or transfers;A little help with bathing/dressing/bathroom;Assistance with cooking/housework;Help with stairs or ramp for entrance;Assist for transportation    Equipment Recommendations Rolling walker (2 wheels);BSC/3in1  Recommendations for Other  Services       Functional Status Assessment Patient has had a recent decline in their functional status and demonstrates the ability to make significant improvements in function in a reasonable and predictable amount of time.     Precautions / Restrictions Precautions Precautions: Fall Restrictions Weight Bearing Restrictions: Yes LLE Weight Bearing: Weight bearing as tolerated Other Position/Activity Restrictions: No KI required, pt able to perform Ind LLE SLR without extensor lag      Mobility  Bed Mobility Overal bed mobility: Modified Independent             General bed mobility comments: Mod extra time and effort and min use of UE's to assist LLE out of bed    Transfers Overall transfer level: Needs assistance Equipment used: Rolling walker (2 wheels) Transfers: Sit to/from Stand Sit to Stand: Min guard           General transfer comment: Mod verbal and visual cues for hand and L foot placement    Ambulation/Gait Ambulation/Gait assistance: Min guard Gait Distance (Feet): 5 Feet Assistive device: Rolling walker (2 wheels) Gait Pattern/deviations: Step-to pattern, Decreased step length - right, Decreased stance time - left, Antalgic Gait velocity: decreased     General Gait Details: Mildly antalgic on the LLE with gait but steady without buckling or overt LOB with mod multi-modal cues for step-to sequencing  Stairs            Wheelchair Mobility    Modified Rankin (Stroke Patients Only)       Balance Overall balance assessment: Needs assistance   Sitting balance-Leahy Scale: Normal     Standing balance support: Bilateral upper  extremity supported, During functional activity Standing balance-Leahy Scale: Good                               Pertinent Vitals/Pain Pain Assessment Pain Assessment: 0-10 Pain Score: 4  Pain Location: L knee Pain Descriptors / Indicators: Sore Pain Intervention(s): Repositioned, Ice applied,  Premedicated before session, Monitored during session    Home Living Family/patient expects to be discharged to:: Private residence Living Arrangements: Alone Available Help at Discharge: Family;Available 24 hours/day Type of Home: House Home Access: Stairs to enter Entrance Stairs-Rails: None Entrance Stairs-Number of Steps: 1   Home Layout: Two level;Able to live on main level with bedroom/bathroom Home Equipment: Shower seat - built in      Prior Function Prior Level of Function : Independent/Modified Independent             Mobility Comments: Ind amb community distances without an AD, no fall history ADLs Comments: Ind with ADLs     Hand Dominance        Extremity/Trunk Assessment   Upper Extremity Assessment Upper Extremity Assessment: Overall WFL for tasks assessed    Lower Extremity Assessment Lower Extremity Assessment: LLE deficits/detail LLE Deficits / Details: BLE ankle strength, AROM, and sensation to light touch grossly WNL, L hip flex strength >/= 3/5 LLE: Unable to fully assess due to pain LLE Sensation: WNL LLE Coordination: WNL       Communication   Communication: No difficulties  Cognition Arousal/Alertness: Awake/alert Behavior During Therapy: WFL for tasks assessed/performed Overall Cognitive Status: Within Functional Limits for tasks assessed                                          General Comments      Exercises Total Joint Exercises Ankle Circles/Pumps: AROM, Strengthening, Both, 10 reps Quad Sets: AROM, Strengthening, Left, 5 reps, 10 reps Straight Leg Raises: AROM, Strengthening, Left, 5 reps Long Arc Quad: AROM, Strengthening, Left, 5 reps, 10 reps Knee Flexion: AROM, Strengthening, Left, 5 reps, 10 reps Goniometric ROM: L knee AROM: 7-60 deg Marching in Standing: Strengthening, Both, 5 reps, Standing Other Exercises Other Exercises: LLE positioning education to promote L knee ext PROM and prevent heel  pressure Other Exercises: HEP education per handout   Assessment/Plan    PT Assessment Patient needs continued PT services  PT Problem List Decreased strength;Decreased range of motion;Decreased activity tolerance;Decreased balance;Decreased mobility;Decreased knowledge of use of DME;Pain       PT Treatment Interventions DME instruction;Gait training;Stair training;Functional mobility training;Therapeutic activities;Therapeutic exercise;Balance training;Patient/family education    PT Goals (Current goals can be found in the Care Plan section)  Acute Rehab PT Goals Patient Stated Goal: To walk normally without pain PT Goal Formulation: With patient Time For Goal Achievement: 11/20/22 Potential to Achieve Goals: Good    Frequency BID     Co-evaluation               AM-PAC PT "6 Clicks" Mobility  Outcome Measure Help needed turning from your back to your side while in a flat bed without using bedrails?: A Little Help needed moving from lying on your back to sitting on the side of a flat bed without using bedrails?: A Little Help needed moving to and from a bed to a chair (including a wheelchair)?: A Little Help needed standing up  from a chair using your arms (e.g., wheelchair or bedside chair)?: A Little Help needed to walk in hospital room?: A Little Help needed climbing 3-5 steps with a railing? : A Lot 6 Click Score: 17    End of Session Equipment Utilized During Treatment: Gait belt Activity Tolerance: Patient tolerated treatment well Patient left: in chair;with call bell/phone within reach;with nursing/sitter in room Nurse Communication: Mobility status;Weight bearing status PT Visit Diagnosis: Other abnormalities of gait and mobility (R26.89);Muscle weakness (generalized) (M62.81);Pain Pain - Right/Left: Left Pain - part of body: Knee    Time: [id number] PT Time Calculation (min) (ACUTE ONLY): 47 min   Charges:   PT Evaluation $PT Eval Moderate Complexity: 1  Mod PT Treatments $Therapeutic Exercise: 8-22 mins $Therapeutic Activity: 8-22 mins       D. Royetta Asal PT, DPT 11/07/22, 5:17 PM

## 2022-11-07 NOTE — Transfer of Care (Signed)
Immediate Anesthesia Transfer of Care Note  Patient: Wendy Garza  Procedure(s) Performed: COMPUTER ASSISTED TOTAL KNEE ARTHROPLASTY (Left: Knee)  Patient Location: PACU  Anesthesia Type:Spinal  Level of Consciousness: drowsy and patient cooperative  Airway & Oxygen Therapy: Patient Spontanous Breathing and Patient connected to face mask oxygen  Post-op Assessment: Report given to RN and Post -op Vital signs reviewed and stable  Post vital signs: Reviewed and stable  Last Vitals:  Vitals Value Taken Time  BP 106/52 (70) 11/07/2022  Temp    Pulse 73 11/07/22 1113  Resp 12 11/07/22 1113  SpO2 97 % 11/07/22 1113  Vitals shown include unvalidated device data.  Last Pain:  Vitals:   11/07/22 0631  TempSrc: Temporal  PainSc: 0-No pain         Complications: No notable events documented.

## 2022-11-07 NOTE — Interval H&P Note (Signed)
History and Physical Interval Note:  11/07/2022 6:08 AM  Wendy Garza  has presented today for surgery, with the diagnosis of PRIMARY OSTEOARTHRITIS OF LEFT KNEE..  The various methods of treatment have been discussed with the patient and family. After consideration of risks, benefits and other options for treatment, the patient has consented to  Procedure(s): COMPUTER ASSISTED TOTAL KNEE ARTHROPLASTY - RNFA (Left) as a surgical intervention.  The patient's history has been reviewed, patient examined, no change in status, stable for surgery.  I have reviewed the patient's chart and labs.  Questions were answered to the patient's satisfaction.     [place]

## 2022-11-07 NOTE — Progress Notes (Signed)
Patient is not able to walk the distance required to go the bathroom, or he/she is unable to safely negotiate stairs required to access the bathroom.  A 3in1 BSC will alleviate this problem  

## 2022-11-07 NOTE — Anesthesia Procedure Notes (Signed)
Date/Time: 11/07/2022 8:04 AM  Performed by: Demetrius Charity, CRNAPre-anesthesia Checklist: Patient identified, Emergency Drugs available, Suction available, Patient being monitored and Timeout performed Patient Re-evaluated:Patient Re-evaluated prior to induction Oxygen Delivery Method: Simple face mask Induction Type: IV induction Placement Confirmation: CO2 detector and positive ETCO2

## 2022-11-07 NOTE — Op Note (Signed)
OPERATIVE NOTE  DATE OF SURGERY:  11/07/2022  PATIENT NAME:  Wendy Garza   DOB: 09/05/1945  MRN: [id number]  PRE-OPERATIVE DIAGNOSIS: Degenerative arthrosis of the left knee, primary  POST-OPERATIVE DIAGNOSIS:  Same  PROCEDURE:  Left total knee arthroplasty using computer-assisted navigation  SURGEON:  Marciano Sequin. M.D.  ANESTHESIA: spinal  ESTIMATED BLOOD LOSS: 50 mL  FLUIDS REPLACED: 1100 mL of crystalloid  TOURNIQUET TIME: 90 minutes  DRAINS: 2 medium black drains  SOFT TISSUE RELEASES: Anterior cruciate ligament, posterior cruciate ligament, deep medial collateral ligament, patellofemoral ligament  IMPLANTS UTILIZED: DePuy Attune size 3 posterior stabilized femoral component (cemented), size 4 rotating platform tibial component (cemented), 35 mm medialized dome patella (cemented), and a 7 mm stabilized rotating platform polyethylene insert.  INDICATIONS FOR SURGERY: Wendy Garza is a 77 y.o. year old female with a long history of progressive knee pain. X-rays demonstrated severe degenerative changes in tricompartmental fashion. The patient had not seen any significant improvement despite conservative nonsurgical intervention. After discussion of the risks and benefits of surgical intervention, the patient expressed understanding of the risks benefits and agree with plans for total knee arthroplasty.   The risks, benefits, and alternatives were discussed at length including but not limited to the risks of infection, bleeding, nerve injury, stiffness, blood clots, the need for revision surgery, cardiopulmonary complications, among others, and they were willing to proceed.  PROCEDURE IN DETAIL: The patient was brought into the operating room and, after adequate spinal anesthesia was achieved, a tourniquet was placed on the patient's upper thigh. The patient's knee and leg were cleaned and prepped with alcohol and DuraPrep and draped in the usual sterile fashion. A  "timeout" was performed as per usual protocol. The lower extremity was exsanguinated using an Esmarch, and the tourniquet was inflated to 300 mmHg. An anterior longitudinal incision was made followed by a standard mid vastus approach. The deep fibers of the medial collateral ligament were elevated in a subperiosteal fashion off of the medial flare of the tibia so as to maintain a continuous soft tissue sleeve. The patella was subluxed laterally and the patellofemoral ligament was incised. Inspection of the knee demonstrated severe degenerative changes with full-thickness loss of articular cartilage. Osteophytes were debrided using a rongeur. Anterior and posterior cruciate ligaments were excised. Two 4.0 mm Schanz pins were inserted in the femur and into the tibia for attachment of the array of trackers used for computer-assisted navigation. Hip center was identified using a circumduction technique. Distal landmarks were mapped using the computer. The distal femur and proximal tibia were mapped using the computer. The distal femoral cutting guide was positioned using computer-assisted navigation so as to achieve a 5 distal valgus cut. The femur was sized and it was felt that a size 3 femoral component was appropriate. A size 3 femoral cutting guide was positioned and the anterior cut was performed and verified using the computer. This was followed by completion of the posterior and chamfer cuts. Femoral cutting guide for the central box was then positioned in the center box cut was performed.  Attention was then directed to the proximal tibia. Medial and lateral menisci were excised. The extramedullary tibial cutting guide was positioned using computer-assisted navigation so as to achieve a 0 varus-valgus alignment and 3 posterior slope. The cut was performed and verified using the computer. The proximal tibia was sized and it was felt that a size 4 tibial tray was appropriate. Tibial and femoral trials were  inserted followed  by insertion of a 7 mm polyethylene insert. This allowed for excellent mediolateral soft tissue balancing both in flexion and in full extension. Finally, the patella was cut and prepared so as to accommodate a 35 mm medialized dome patella. A patella trial was placed and the knee was placed through a range of motion with excellent patellar tracking appreciated. The femoral trial was removed after debridement of posterior osteophytes. The central post-hole for the tibial component was reamed followed by insertion of a keel punch. Tibial trials were then removed. Cut surfaces of bone were irrigated with copious amounts of normal saline using pulsatile lavage and then suctioned dry. Polymethylmethacrylate cement with gentamicin was prepared in the usual fashion using a vacuum mixer. Cement was applied to the cut surface of the proximal tibia as well as along the undersurface of a size 4 rotating platform tibial component. Tibial component was positioned and impacted into place. Excess cement was removed using [employer] fast streamer. Cement was then applied to the cut surfaces of the femur as well as along the posterior flanges of the size 3 femoral component. The femoral component was positioned and impacted into place. Excess cement was removed using [employer] fast streamer. A 7 mm polyethylene trial was inserted and the knee was brought into full extension with steady axial compression applied. Finally, cement was applied to the backside of a 35 mm medialized dome patella and the patellar component was positioned and patellar clamp applied. Excess cement was removed using [employer] fast streamer. After adequate curing of the cement, the tourniquet was deflated after a total tourniquet time of 90 minutes. Hemostasis was achieved using electrocautery. The knee was irrigated with copious amounts of normal saline using pulsatile lavage followed by 450 ml of Surgiphor and then suctioned dry. 20 mL of 1.3% Exparel and 60 mL of  0.25% Marcaine in 40 mL of normal saline was injected along the posterior capsule, medial and lateral gutters, and along the arthrotomy site. A 7 mm stabilized rotating platform polyethylene insert was inserted and the knee was placed through a range of motion with excellent mediolateral soft tissue balancing appreciated and excellent patellar tracking noted. 2 medium drains were placed in the wound bed and brought out through separate stab incisions. The medial parapatellar portion of the incision was reapproximated using interrupted sutures of #1 Vicryl. Subcutaneous tissue was approximated in layers using first #0 Vicryl followed #2-0 Vicryl. The skin was approximated with skin staples. A sterile dressing was applied.  The patient tolerated the procedure well and was transported to the recovery room in stable condition.    Nari Vannatter P. Holley Bouche., M.D.

## 2022-11-07 NOTE — TOC Progression Note (Signed)
Transition of Care [facility]) - Progression Note    Patient Details  Name: Wendy Garza MRN: [id number] Date of Birth: 03-14-1946  Transition of Care [facility] CM/SW [place], RN Phone Number: 11/07/2022, 3:19 PM  Clinical Narrative:     I reached out to [employer] and spoke with Colletta [place], She tell me that the patient is able tog et 20 hours total of PCS services, the agency will be the one to arrange,        Expected Discharge Plan and Services                                               Social Determinants of Health (SDOH) Interventions SDOH Screenings   Food Insecurity: No Food Insecurity (11/07/2022)  Housing: Low Risk  (11/07/2022)  Transportation Needs: No Transportation Needs (11/07/2022)  Utilities: Not At Risk (11/07/2022)  Alcohol Screen: Low Risk  (05/24/2022)  Depression (PHQ2-9): Low Risk  (05/24/2022)  Financial Resource Strain: Low Risk  (05/10/2021)  Tobacco Use: High Risk (11/07/2022)    Readmission Risk Interventions     No data to display

## 2022-11-08 ENCOUNTER — Encounter: Payer: Self-pay | Admitting: Orthopedic Surgery

## 2022-11-08 DIAGNOSIS — M1712 Unilateral primary osteoarthritis, left knee: Secondary | ICD-10-CM | POA: Diagnosis not present

## 2022-11-08 MED ORDER — ACETAMINOPHEN 10 MG/ML IV SOLN
INTRAVENOUS | Status: AC
  Administered 2022-11-08: 1000 mg via INTRAVENOUS
  Filled 2022-11-08: qty 100

## 2022-11-08 MED ORDER — ENOXAPARIN SODIUM 30 MG/0.3ML IJ SOSY
PREFILLED_SYRINGE | INTRAMUSCULAR | Status: AC
  Administered 2022-11-08: 30 mg via SUBCUTANEOUS
  Filled 2022-11-08: qty 0.3

## 2022-11-08 MED ORDER — FERROUS SULFATE 325 (65 FE) MG PO TABS
ORAL_TABLET | ORAL | Status: AC
  Administered 2022-11-08: 325 mg via ORAL
  Filled 2022-11-08: qty 1

## 2022-11-08 MED ORDER — AMLODIPINE BESYLATE 5 MG PO TABS
ORAL_TABLET | ORAL | Status: AC
  Filled 2022-11-08: qty 1

## 2022-11-08 MED ORDER — ACETAMINOPHEN 10 MG/ML IV SOLN
INTRAVENOUS | Status: AC
  Filled 2022-11-08: qty 100

## 2022-11-08 MED ORDER — ENOXAPARIN SODIUM 40 MG/0.4ML IJ SOSY: 40.0000 mg | PREFILLED_SYRINGE | INTRAMUSCULAR | 0 refills | Status: DC

## 2022-11-08 MED ORDER — METOPROLOL TARTRATE 25 MG PO TABS
ORAL_TABLET | ORAL | Status: AC
  Filled 2022-11-08: qty 1

## 2022-11-08 MED ORDER — METOCLOPRAMIDE HCL 10 MG PO TABS
ORAL_TABLET | ORAL | Status: AC
  Administered 2022-11-08: 10 mg via ORAL
  Filled 2022-11-08: qty 1

## 2022-11-08 MED ORDER — OXYCODONE HCL 5 MG PO TABS: 5.0000 mg | ORAL_TABLET | ORAL | 0 refills | Status: DC | PRN

## 2022-11-08 MED ORDER — OXYCODONE HCL 5 MG PO TABS
ORAL_TABLET | ORAL | Status: AC
  Filled 2022-11-08: qty 1

## 2022-11-08 MED ORDER — CELECOXIB 200 MG PO CAPS: 200.0000 mg | ORAL_CAPSULE | Freq: Two times a day (BID) | ORAL | 1 refills | Status: DC

## 2022-11-08 MED ORDER — TRAMADOL HCL 50 MG PO TABS: 50.0000 mg | ORAL_TABLET | ORAL | 0 refills | Status: DC | PRN

## 2022-11-08 MED ORDER — MOMETASONE FURO-FORMOTEROL FUM 100-5 MCG/ACT IN AERO: 2.0000 | INHALATION_SPRAY | Freq: Two times a day (BID) | RESPIRATORY_TRACT | Status: DC

## 2022-11-08 NOTE — Progress Notes (Signed)
  Subjective: 1 Day Post-Op Procedure(s) (LRB): COMPUTER ASSISTED TOTAL KNEE ARTHROPLASTY (Left) Patient reports pain as mild.   Patient is well, and has had no acute complaints or problems Plan is to go Home after hospital stay. Negative for chest pain and shortness of breath Fever: no Gastrointestinal: Negative for nausea and vomiting  Objective: Vital signs in last 24 hours: Temp:  [97.1 F (36.2 C)-99 F (37.2 C)] 98.2 F (36.8 C) (03/14 0430) Pulse Rate:  [66-102] 86 (03/14 0430) Resp:  [12-25] 18 (03/14 0430) BP: (96-142)/(52-67) 120/56 (03/14 0430) SpO2:  [92 %-99 %] 92 % (03/14 0430)  Intake/Output from previous day:  Intake/Output Summary (Last 24 hours) at 11/08/2022 0717 Last data filed at 11/08/2022 0503 Gross per 24 hour  Intake 497.16 ml  Output 1155 ml  Net -657.84 ml    Intake/Output this shift: No intake/output data recorded.  Labs: No results for input(s): "HGB" in the last 72 hours. No results for input(s): "WBC", "RBC", "HCT", "PLT" in the last 72 hours. No results for input(s): "NA", "K", "CL", "CO2", "BUN", "CREATININE", "GLUCOSE", "CALCIUM" in the last 72 hours. No results for input(s): "LABPT", "INR" in the last 72 hours.   EXAM General - Patient is Alert and Oriented Extremity - Neurovascular intact Sensation intact distally Dorsiflexion/Plantar flexion intact Compartment soft Dressing/Incision - clean, dry, with the Hemovac tubing removed without complication.  The Hemovac tubing was intact on removal. Motor Function - intact, moving foot and toes well on exam.  Able to straight leg raise independently  Past Medical History:  Diagnosis Date   Arthritis    Asthma    Cancer ([place]) 2003   UTERINE, total Hysterectomy   COPD (chronic obstructive pulmonary disease) ([place])    Depression    Hyperlipidemia    Hypertension    Shortness of breath dyspnea    DOE    Assessment/Plan: 1 Day Post-Op Procedure(s) (LRB): COMPUTER ASSISTED TOTAL  KNEE ARTHROPLASTY (Left) Principal Problem:   Total knee replacement status  Estimated body mass index is 27.46 kg/m as calculated from the following:   Height as of this encounter: 5\' 4"  (1.626 m).   Weight as of this encounter: 72.6 kg. Advance diet Up with therapy D/C IV fluids Discharge home with home health  DVT Prophylaxis - Lovenox, Foot Pumps, and TED hose Weight-Bearing as tolerated to left leg  Reche Dixon, PA-C Orthopaedic Surgery 11/08/2022, 7:17 AM

## 2022-11-08 NOTE — Telephone Encounter (Signed)
Please review and send  pt  is in hospital

## 2022-11-08 NOTE — Discharge Summary (Addendum)
Physician Discharge Summary  Subjective: 1 Day Post-Op Procedure(s) (LRB): COMPUTER ASSISTED TOTAL KNEE ARTHROPLASTY (Left) Patient reports pain as mild.   Patient seen in rounds with Dr. Marry Guan. Patient is well, and has had no acute complaints or problems Patient is ready to go home with home health physical therapy  Physician Discharge Summary  Patient ID: Wendy Garza MRN: [id number] DOB/AGE: Feb 09, 1946 77 y.o.  Admit date: 11/07/2022 Discharge date: 11/08/2022  Admission Diagnoses:  Discharge Diagnoses:  Principal Problem:   Total knee replacement status   Discharged Condition: good  Hospital Course: The patient is postop day 1 from a left total knee arthroplasty.  Her vitals are stable.  Her pain is manageable.  She is able to do a straight leg raise.  She had her Hemovac removed this morning and surgical bandage removed.  She is ready to go home after physical therapy.  Treatments: surgery:   Left total knee arthroplasty using computer-assisted navigation   SURGEON:  Marciano Sequin. M.D.   ANESTHESIA: spinal   ESTIMATED BLOOD LOSS: 50 mL   FLUIDS REPLACED: 1100 mL of crystalloid   TOURNIQUET TIME: 90 minutes   DRAINS: 2 medium black drains   SOFT TISSUE RELEASES: Anterior cruciate ligament, posterior cruciate ligament, deep medial collateral ligament, patellofemoral ligament   IMPLANTS UTILIZED: DePuy Attune size 3 posterior stabilized femoral component (cemented), size 4 rotating platform tibial component (cemented), 35 mm medialized dome patella (cemented), and a 7 mm stabilized rotating platform polyethylene insert.  Discharge Exam: Blood pressure 121/63, pulse 76, temperature (!) 97.2 F (36.2 C), temperature source Temporal, resp. rate 18, height '5\' 4"'$  (1.626 m), weight 72.6 kg, SpO2 92 %.   Disposition: Discharge disposition: 01-Home or Self Care        Allergies as of 11/08/2022       Reactions   Aspirin Other (See Comments)   Patient  cannot recall what happened   Crab [shellfish Allergy] Hives   Latex Rash   IgE < 0.10 ku/L (WNL; Class 0) on 10/31/2022   Nickel Rash        Medication List     STOP taking these medications    ibuprofen 200 MG tablet Commonly known as: ADVIL       TAKE these medications    acetaminophen 500 MG tablet Commonly known as: TYLENOL Take 1,000 mg by mouth every 6 (six) hours as needed.   albuterol 108 (90 Base) MCG/ACT inhaler Commonly known as: VENTOLIN HFA Inhale 2 puffs into the lungs every 6 (six) hours as needed for wheezing or shortness of breath. Reported on 11/10/2015   amLODipine 2.5 MG tablet Commonly known as: NORVASC TAKE 1 TABLET BY MOUTH EVERY DAY   budesonide-formoterol 80-4.5 MCG/ACT inhaler Commonly known as: SYMBICORT Inhale 2 puffs into the lungs 2 (two) times daily.   celecoxib 200 MG capsule Commonly known as: CELEBREX Take 1 capsule (200 mg total) by mouth 2 (two) times daily.   enoxaparin 40 MG/0.4ML injection Commonly known as: LOVENOX Inject 0.4 mLs (40 mg total) into the skin daily for 14 days.   levothyroxine 50 MCG tablet Commonly known as: SYNTHROID TAKE 1 TABLET BY MOUTH DAILY BEFORE BREAKFAST   metoprolol tartrate 50 MG tablet Commonly known as: LOPRESSOR TAKE 1/2 OF A TABLET BY MOUTH TWICE A DAY   mirabegron ER 25 MG Tb24 tablet Commonly known as: MYRBETRIQ Take 1 tablet (25 mg total) by mouth daily.   oxyCODONE 5 MG immediate release tablet Commonly known as:  Oxy IR/ROXICODONE Take 1 tablet (5 mg total) by mouth every 4 (four) hours as needed for moderate pain (pain score 4-6).   rosuvastatin 20 MG tablet Commonly known as: CRESTOR TAKE 1 TABLET BY MOUTH EVERY DAY What changed: when to take this   sertraline 50 MG tablet Commonly known as: ZOLOFT TAKE 1 TABLET BY MOUTH EVERY DAY   traMADol 50 MG tablet Commonly known as: ULTRAM Take 1-2 tablets (50-100 mg total) by mouth every 4 (four) hours as needed for moderate  pain.               Durable Medical Equipment  (From admission, onward)           Start     Ordered   11/07/22 1213  DME Walker rolling  Once       Question:  Patient needs a walker to treat with the following condition  Answer:  Total knee replacement status   11/07/22 1213   11/07/22 1213  DME Bedside commode  Once       Comments: Patient is not able to walk the distance required to go the bathroom, or he/she is unable to safely negotiate stairs required to access the bathroom.  A 3in1 BSC will alleviate this problem  Question:  Patient needs a bedside commode to treat with the following condition  Answer:  Total knee replacement status   11/07/22 1213            Follow-up Information     Watt Climes, PA Follow up on 11/20/2022.   Specialty: Physician Assistant Why: at 1:15pm Contact information: [place] [address] [phone]         Dereck Leep, MD Follow up on 12/20/2022.   Specialty: Orthopedic Surgery Why: at 2:30pm Contact information: [address] [facility] [place] [address] [phone]                 Signed: Prescott Parma, Ayona Yniguez 11/08/2022, 11:46 AM   Objective: Vital signs in last 24 hours: Temp:  [97.2 F (36.2 C)-99 F (37.2 C)] 97.2 F (36.2 C) (03/14 1129) Pulse Rate:  [66-102] 76 (03/14 1129) Resp:  [15-25] 18 (03/14 1129) BP: (112-142)/(56-77) 121/63 (03/14 1129) SpO2:  [91 %-97 %] 92 % (03/14 1129)  Intake/Output from previous day:  Intake/Output Summary (Last 24 hours) at 11/08/2022 1146 Last data filed at 11/08/2022 0900 Gross per 24 hour  Intake 737.16 ml  Output 855 ml  Net -117.84 ml    Intake/Output this shift: Total I/O In: 240 [P.O.:240] Out: -   Labs: No results for input(s): "HGB" in the last 72 hours. No results for input(s): "WBC", "RBC", "HCT", "PLT" in the last 72 hours. No results for input(s): "NA", "K", "CL", "CO2", "BUN", "CREATININE", "GLUCOSE",  "CALCIUM" in the last 72 hours. No results for input(s): "LABPT", "INR" in the last 72 hours.  EXAM: General - Patient is Alert and Oriented Extremity - Neurovascular intact Sensation intact distally Dorsiflexion/Plantar flexion intact Compartment soft Incision - clean, dry, with the Hemovac tubing removed with no complication. Motor Function -plantarflexion and dorsiflexion are intact.  Able to straight leg raise independently.  Assessment/Plan: 1 Day Post-Op Procedure(s) (LRB): COMPUTER ASSISTED TOTAL KNEE ARTHROPLASTY (Left) Procedure(s) (LRB): COMPUTER ASSISTED TOTAL KNEE ARTHROPLASTY (Left) Past Medical History:  Diagnosis Date   Arthritis    Asthma    Cancer ([place]) 2003   UTERINE, total Hysterectomy   COPD (chronic obstructive pulmonary disease) ([place])  Depression    Hyperlipidemia    Hypertension    Shortness of breath dyspnea    DOE   Principal Problem:   Total knee replacement status  Estimated body mass index is 27.46 kg/m as calculated from the following:   Height as of this encounter: '5\' 4"'$  (1.626 m).   Weight as of this encounter: 72.6 kg. Advance diet Up with therapy D/C IV fluids Discharge home with home health Diet - Regular diet Follow up - in 2 weeks Activity - WBAT Disposition - Home Condition Upon Discharge - Good DVT Prophylaxis - Lovenox and TED hose  Reche Dixon, PA-C Orthopaedic Surgery 11/08/2022, 11:46 AM

## 2022-11-08 NOTE — Anesthesia Postprocedure Evaluation (Signed)
Anesthesia Post Note  Patient: Wendy Garza  Procedure(s) Performed: COMPUTER ASSISTED TOTAL KNEE ARTHROPLASTY (Left: Knee)  Patient location during evaluation: Nursing Unit Anesthesia Type: Spinal Level of consciousness: awake, awake and alert and oriented Pain management: pain level controlled Vital Signs Assessment: post-procedure vital signs reviewed and stable Respiratory status: spontaneous breathing, nonlabored ventilation and respiratory function stable Cardiovascular status: blood pressure returned to baseline and stable Postop Assessment: no headache and no backache Anesthetic complications: no   No notable events documented.   Last Vitals:  Vitals:   11/07/22 2239 11/08/22 0430  BP: (!) 134/58 (!) 120/56  Pulse: 100 86  Resp: 18 18  Temp: 36.6 C 36.8 C  SpO2: 93% 92%    Last Pain:  Vitals:   11/08/22 0430  TempSrc:   PainSc: 0-No pain                 [name]

## 2022-11-08 NOTE — Progress Notes (Signed)
Physical Therapy Treatment Patient Details Name: Wendy Garza MRN: [id number] DOB: [birth year] Today's Date: 11/08/2022   History of Present Illness Pt is a 77 yo F diagnosed with degenerative arthrosis of the left knee and is s/p elective L TKA.  PMH includes: asthma, COPD, and HTN.    PT Comments    Pt was long sitting in bed upon arriving. She is A and O but presents with flat affect. Pt was able to safely exit bed, stand, and ambulate without physical assistance. She performed stairs without safety concern. Reviewed importance of polar care, positioning, car transfers, and overall education on importance of continued routine mobility. Pt has HHPT schedule for tomorrow. Cleared form an acute PT standpoint for safe DC home with HHPT to follow.    Recommendations for follow up therapy are one component of a multi-disciplinary discharge planning process, led by the attending physician.  Recommendations may be updated based on patient status, additional functional criteria and insurance authorization.  Follow Up Recommendations  Home health PT     Assistance Recommended at Discharge Set up Supervision/Assistance  Patient can return home with the following A little help with walking and/or transfers;A little help with bathing/dressing/bathroom;Assistance with cooking/housework;Help with stairs or ramp for entrance;Assist for transportation   Equipment Recommendations  None recommended by PT (Pt endorses haveing all equipment needs met)       Precautions / Restrictions Precautions Precautions: Fall Restrictions Weight Bearing Restrictions: Yes LLE Weight Bearing: Weight bearing as tolerated Other Position/Activity Restrictions: No KI required, pt able to perform Ind LLE SLR without extensor lag     Mobility  Bed Mobility Overal bed mobility: Modified Independent    Transfers Overall transfer level: Needs assistance Equipment used: Rolling walker (2 wheels) Transfers: Sit  to/from Stand Sit to Stand: Supervision    General transfer comment: no physical assistance. vcs for handplacement    Ambulation/Gait Ambulation/Gait assistance: Supervision Gait Distance (Feet): 200 Feet Assistive device: Rolling walker (2 wheels) Gait Pattern/deviations: Step-to pattern, Antalgic, Step-through pattern Gait velocity: decreased  General Gait Details: Pt was able to ambulate 200 ft with RW. no LOB or safety concern    Balance Overall balance assessment: Needs assistance Sitting-balance support: Bilateral upper extremity supported, Feet supported Sitting balance-Leahy Scale: Normal     Standing balance support: Bilateral upper extremity supported, During functional activity Standing balance-Leahy Scale: Good    Cognition Arousal/Alertness: Awake/alert Behavior During Therapy: WFL for tasks assessed/performed Overall Cognitive Status: Within Functional Limits for tasks assessed    General Comments: Pt A and O x 4               Pertinent Vitals/Pain Pain Assessment Pain Assessment: 0-10 Pain Score: 3  Pain Location: L knee Pain Descriptors / Indicators: Sore Pain Intervention(s): Limited activity within patient's tolerance, Monitored during session, Premedicated before session, Repositioned, Ice applied     PT Goals (current goals can now be found in the care plan section) Acute Rehab PT Goals Patient Stated Goal: go home Progress towards PT goals: Progressing toward goals    Frequency    BID      PT Plan Current plan remains appropriate       AM-PAC PT "6 Clicks" Mobility   Outcome Measure  Help needed turning from your back to your side while in a flat bed without using bedrails?: A Little Help needed moving from lying on your back to sitting on the side of a flat bed without using bedrails?: A Little Help needed  moving to and from a bed to a chair (including a wheelchair)?: A Little Help needed standing up from a chair using your arms  (e.g., wheelchair or bedside chair)?: A Little Help needed to walk in hospital room?: A Little Help needed climbing 3-5 steps with a railing? : A Little 6 Click Score: 18    End of Session Equipment Utilized During Treatment: Gait belt Activity Tolerance: Patient tolerated treatment well Patient left: in chair;with call bell/phone within reach;with nursing/sitter in room Nurse Communication: Mobility status;Weight bearing status PT Visit Diagnosis: Other abnormalities of gait and mobility (R26.89);Muscle weakness (generalized) (M62.81);Pain Pain - Right/Left: Left Pain - part of body: Knee     Time: 0802-0826 PT Time Calculation (min) (ACUTE ONLY): 24 min  Charges:  $Gait Training: 8-22 mins $Therapeutic Activity: 8-22 mins                    Julaine Fusi PTA 11/08/22, 9:38 AM

## 2022-11-08 NOTE — Evaluation (Signed)
Occupational Therapy Evaluation Patient Details Name: Wendy Garza MRN: [id number] DOB: 1946-05-24 Today's Date: 11/08/2022   History of Present Illness Pt is a 77 yo F diagnosed with degenerative arthrosis of the left knee and is s/p elective L TKA.  PMH includes: asthma, COPD, and HTN.   Clinical Impression   Pt seen for OT evaluation this date, POD#1 from above surgery. Pt is alert and oriented x4, agreeable to OT evaluation with sister present. PTA pt is generally MOD I-I in ADL/IADL, amb with no AD; Pt provided education via hand out and demo re: polar care mgt, falls prevention strategies, home/routines modifications, DME/AE for LB bathing and dressing tasks, and compression stocking mgt. Pt would benefit from skilled OT services including additional instruction in dressing techniques with or without assistive devices for dressing and bathing skills to support recall and carryover prior to discharge and ultimately to maximize safety, independence, and minimize falls risk and caregiver burden. Pt will continue to benefit from skilled OT to address functional deficits and to facilitate return to PLOF.   Recommendations for follow up therapy are one component of a multi-disciplinary discharge planning process, led by the attending physician.  Recommendations may be updated based on patient status, additional functional criteria and insurance authorization.   Follow Up Recommendations  Home health OT     Assistance Recommended at Discharge Intermittent Supervision/Assistance  Patient can return home with the following A little help with walking and/or transfers;A little help with bathing/dressing/bathroom    Functional Status Assessment  Patient has had a recent decline in their functional status and demonstrates the ability to make significant improvements in function in a reasonable and predictable amount of time.  Equipment Recommendations  BSC/3in1    Recommendations for Other  Services       Precautions / Restrictions Precautions Precautions: Fall Restrictions Weight Bearing Restrictions: Yes LLE Weight Bearing: Weight bearing as tolerated Other Position/Activity Restrictions: No KI required, pt able to perform Ind LLE SLR without extensor lag      Mobility Bed Mobility               General bed mobility comments: NT in recliner pre/post session    Transfers Overall transfer level: Needs assistance Equipment used: Rolling walker (2 wheels) Transfers: Sit to/from Stand Sit to Stand: Supervision                  Balance Overall balance assessment: Needs assistance Sitting-balance support: Bilateral upper extremity supported, Feet supported Sitting balance-Leahy Scale: Normal     Standing balance support: Bilateral upper extremity supported, During functional activity Standing balance-Leahy Scale: Good                             ADL either performed or assessed with clinical judgement   ADL Overall ADL's : Needs assistance/impaired Eating/Feeding: Set up;Sitting   Grooming: Wash/dry face;Standing;Supervision/safety           Upper Body Dressing : Set up;Sitting   Lower Body Dressing: Minimal assistance;Sit to/from stand Lower Body Dressing Details (indicate cue type and reason): underwear, pants Toilet Transfer: Supervision/safety;Min guard;Rolling walker (2 wheels);BSC/3in1   Toileting- Clothing Manipulation and Hygiene: Supervision/safety;Sit to/from stand Toileting - Clothing Manipulation Details (indicate cue type and reason): intermittent vcs for technique     Functional mobility during ADLs: Supervision/safety;Rolling walker (2 wheels) (approx 30' with 2 attempts)       Vision Patient Visual Report: No change from baseline  Perception     Praxis      Pertinent Vitals/Pain Pain Assessment Pain Assessment: 0-10 Pain Score: 5  Pain Location: L knee Pain Descriptors / Indicators:  Sore Pain Intervention(s): Limited activity within patient's tolerance, Monitored during session, Ice applied, Repositioned, Premedicated before session     Hand Dominance     Extremity/Trunk Assessment Upper Extremity Assessment Upper Extremity Assessment: Overall WFL for tasks assessed (pt reports she has an essential tremor, noted during ADL tasks)   Lower Extremity Assessment Lower Extremity Assessment: Defer to PT evaluation;LLE deficits/detail LLE Deficits / Details: s/p L TKA       Communication Communication Communication: No difficulties   Cognition Arousal/Alertness: Awake/alert Behavior During Therapy: WFL for tasks assessed/performed Overall Cognitive Status: Within Functional Limits for tasks assessed                                       General Comments  vital signs monitored, appear stable throughout    Exercises     Shoulder Instructions      Home Living Family/patient expects to be discharged to:: Private residence Living Arrangements: Alone Available Help at Discharge: Family;Available 24 hours/day Type of Home: House Home Access: Stairs to enter [employer] of Steps: 1 Entrance Stairs-Rails: None Home Layout: Two level;Able to live on main level with bedroom/bathroom     Bathroom Shower/Tub: Occupational psychologist: Handicapped height     Home Equipment: [place] in          Prior Functioning/Environment Prior Level of Function : Independent/Modified Independent             Mobility Comments: amb with no AD ADLs Comments: MOD I-I in ADL/IADL        OT Problem List: Decreased activity tolerance;Impaired balance (sitting and/or standing);Decreased knowledge of use of DME or AE      OT Treatment/Interventions: Self-care/ADL training;Therapeutic exercise;Therapeutic activities;Energy conservation;Patient/family education    OT Goals(Current goals can be found in the care plan  section) Acute Rehab OT Goals Patient Stated Goal: return home OT Goal Formulation: With patient Time For Goal Achievement: 11/22/22 Potential to Achieve Goals: Good ADL Goals Pt Will Perform Grooming: with modified independence Pt Will Perform Lower Body Dressing: with modified independence;sit to/from stand Pt Will Transfer to Toilet: with modified independence;ambulating Pt Will Perform Toileting - Clothing Manipulation and hygiene: with modified independence;sit to/from stand  OT Frequency: Min 2X/week    Co-evaluation              AM-PAC OT "6 Clicks" Daily Activity     Outcome Measure Help from another person eating meals?: None Help from another person taking care of personal grooming?: None Help from another person toileting, which includes using toliet, bedpan, or urinal?: None Help from another person bathing (including washing, rinsing, drying)?: A Little Help from another person to put on and taking off regular upper body clothing?: None Help from another person to put on and taking off regular lower body clothing?: A Little 6 Click Score: 22   End of Session Equipment Utilized During Treatment: Rolling walker (2 wheels) Nurse Communication: Mobility status  Activity Tolerance: Patient tolerated treatment well Patient left: in bed;with call bell/phone within reach;with family/visitor present;with nursing/sitter in room  OT Visit Diagnosis: Unsteadiness on feet (R26.81);Other abnormalities of gait and mobility (R26.89)  [id number] OT Time Calculation (min): 22 min Charges:  OT General Charges $OT Visit: 1 Visit OT Evaluation $OT Eval Low Complexity: 1 Low  Shanon Payor, OTD OTR/L  11/08/22, 10:59 AM

## 2022-11-08 NOTE — Plan of Care (Signed)
Patient met all discharge criteria follow left knee arthoplasty.

## 2022-11-12 ENCOUNTER — Encounter: Payer: Self-pay | Admitting: Orthopedic Surgery

## 2022-11-20 DIAGNOSIS — M25562 Pain in left knee: Secondary | ICD-10-CM | POA: Diagnosis not present

## 2022-11-20 DIAGNOSIS — Z96652 Presence of left artificial knee joint: Secondary | ICD-10-CM | POA: Diagnosis not present

## 2022-11-22 ENCOUNTER — Ambulatory Visit: Payer: PPO | Admitting: Nurse Practitioner

## 2022-11-22 DIAGNOSIS — Z96652 Presence of left artificial knee joint: Secondary | ICD-10-CM | POA: Diagnosis not present

## 2022-11-22 DIAGNOSIS — M25562 Pain in left knee: Secondary | ICD-10-CM | POA: Diagnosis not present

## 2022-11-26 ENCOUNTER — Encounter: Payer: Self-pay | Admitting: Nurse Practitioner

## 2022-11-26 ENCOUNTER — Ambulatory Visit (INDEPENDENT_AMBULATORY_CARE_PROVIDER_SITE_OTHER): Payer: PPO | Admitting: Nurse Practitioner

## 2022-11-26 VITALS — BP 122/53 | HR 80 | Temp 97.1°F | Resp 16 | Ht 64.0 in | Wt 153.6 lb

## 2022-11-26 DIAGNOSIS — M25562 Pain in left knee: Secondary | ICD-10-CM | POA: Diagnosis not present

## 2022-11-26 DIAGNOSIS — R7303 Prediabetes: Secondary | ICD-10-CM | POA: Diagnosis not present

## 2022-11-26 DIAGNOSIS — J4489 Other specified chronic obstructive pulmonary disease: Secondary | ICD-10-CM

## 2022-11-26 DIAGNOSIS — Z23 Encounter for immunization: Secondary | ICD-10-CM | POA: Diagnosis not present

## 2022-11-26 DIAGNOSIS — Z96652 Presence of left artificial knee joint: Secondary | ICD-10-CM | POA: Diagnosis not present

## 2022-11-26 LAB — POCT GLYCOSYLATED HEMOGLOBIN (HGB A1C): Hemoglobin A1C: 5.6 % (ref 4.0–5.6)

## 2022-11-26 MED ORDER — ZOSTER VAC RECOMB ADJUVANTED 50 MCG/0.5ML IM SUSR: 0.5000 mL | Freq: Once | INTRAMUSCULAR | 0 refills | Status: AC

## 2022-11-26 MED ORDER — BREZTRI AEROSPHERE 160-9-4.8 MCG/ACT IN AERO: 2.0000 | INHALATION_SPRAY | Freq: Two times a day (BID) | RESPIRATORY_TRACT | 11 refills | Status: DC

## 2022-11-26 MED ORDER — ALBUTEROL SULFATE HFA 108 (90 BASE) MCG/ACT IN AERS: 2.0000 | INHALATION_SPRAY | Freq: Four times a day (QID) | RESPIRATORY_TRACT | 5 refills | Status: DC | PRN

## 2022-11-26 NOTE — Progress Notes (Signed)
[facility] [place], [place] [address]  Internal MEDICINE  Office Visit Note  Patient Name: Wendy Garza  [id number]  [id number]  Date of Service: 11/26/2022  Chief Complaint  Patient presents with   Depression   Hyperlipidemia   Hypertension    HPI Hawraa presents for a follow-up visit for prediabetes, COPD, and vaccines.  Prediabetes -- A1c is 5.6 which is normal and improved from previous level of 5.9.  COPD/asthma -- has been on symbicort which is no longer covered by her insurance, would like to try something different that is covered by her insurance.  Needs shingles vaccine.     Current Medication: Outpatient Encounter Medications as of 11/26/2022  Medication Sig   acetaminophen (TYLENOL) 500 MG tablet Take 1,000 mg by mouth every 6 (six) hours as needed.   amLODipine (NORVASC) 2.5 MG tablet TAKE 1 TABLET BY MOUTH EVERY DAY   Budeson-Glycopyrrol-Formoterol (BREZTRI AEROSPHERE) 160-9-4.8 MCG/ACT AERO Inhale 2 puffs into the lungs in the morning and at bedtime.   celecoxib (CELEBREX) 200 MG capsule Take 1 capsule (200 mg total) by mouth 2 (two) times daily.   levothyroxine (SYNTHROID) 50 MCG tablet TAKE 1 TABLET BY MOUTH DAILY BEFORE BREAKFAST   metoprolol tartrate (LOPRESSOR) 50 MG tablet TAKE 1/2 OF A TABLET BY MOUTH TWICE A DAY   mirabegron ER (MYRBETRIQ) 25 MG TB24 tablet Take 1 tablet (25 mg total) by mouth daily.   oxyCODONE (OXY IR/ROXICODONE) 5 MG immediate release tablet Take 1 tablet (5 mg total) by mouth every 4 (four) hours as needed for moderate pain (pain score 4-6).   rosuvastatin (CRESTOR) 20 MG tablet TAKE 1 TABLET BY MOUTH EVERY DAY (Patient taking differently: Take 20 mg by mouth at bedtime.)   sertraline (ZOLOFT) 50 MG tablet TAKE 1 TABLET BY MOUTH EVERY DAY   traMADol (ULTRAM) 50 MG tablet Take 1-2 tablets (50-100 mg total) by mouth every 4 (four) hours as needed for moderate pain.   [DISCONTINUED] albuterol (VENTOLIN HFA) 108  (90 Base) MCG/ACT inhaler Inhale 2 puffs into the lungs every 6 (six) hours as needed for wheezing or shortness of breath. Reported on 11/10/2015   [DISCONTINUED] budesonide-formoterol (SYMBICORT) 80-4.5 MCG/ACT inhaler Inhale 2 puffs into the lungs 2 (two) times daily.   [DISCONTINUED] Zoster Vaccine Adjuvanted [facility]) injection Inject 0.5 mLs into the muscle once.   albuterol (VENTOLIN HFA) 108 (90 Base) MCG/ACT inhaler Inhale 2 puffs into the lungs every 6 (six) hours as needed for wheezing or shortness of breath. Reported on 11/10/2015   enoxaparin (LOVENOX) 40 MG/0.4ML injection Inject 0.4 mLs (40 mg total) into the skin daily for 14 days.   Zoster Vaccine Adjuvanted [facility]) injection Inject 0.5 mLs into the muscle once for 1 dose.   No facility-administered encounter medications on file as of 11/26/2022.    Surgical History: Past Surgical History:  Procedure Laterality Date   ABDOMINAL HYSTERECTOMY     BREAST SURGERY Left    BIOPSY   CATARACT EXTRACTION W/PHACO Left 10/04/2015   Procedure: CATARACT EXTRACTION PHACO AND INTRAOCULAR LENS PLACEMENT (IOC);  Surgeon: Birder Robson, MD;  Location: [facility] ORS;  Service: Ophthalmology;  Laterality: Left;  [place]: 00:46.8 AP%: 21.7 CDE: 10.19 Lot # [id number]   CATARACT EXTRACTION W/PHACO Right 10/25/2015   Procedure: CATARACT EXTRACTION PHACO AND INTRAOCULAR LENS PLACEMENT ([place]);  Surgeon: Birder Robson, MD;  Location: [facility] ORS;  Service: Ophthalmology;  Laterality: Right;  [place] 00:48 AP% 25.2 CDE 12.16 fluid pack lot # [id number]  COLONOSCOPY WITH PROPOFOL N/A 05/28/2016   Procedure: COLONOSCOPY WITH PROPOFOL;  Surgeon: Manya Silvas, MD;  Location: [facility] ENDOSCOPY;  Service: Endoscopy;  Laterality: N/A;   COLONOSCOPY WITH PROPOFOL N/A 07/03/2021   Procedure: COLONOSCOPY WITH PROPOFOL;  Surgeon: Lin Landsman, MD;  Location: [facility] ENDOSCOPY;  Service: Gastroenterology;  Laterality: N/A;   CYSTOSCOPY WITH BIOPSY N/A 08/31/2019    Procedure: CYSTOSCOPY WITH bladder BIOPSY;  Surgeon: Hollice Espy, MD;  Location: [facility] ORS;  Service: Urology;  Laterality: N/A;   CYSTOSCOPY WITH FULGERATION N/A 08/31/2019   Procedure: CYSTOSCOPY WITH FULGERATION;  Surgeon: Hollice Espy, MD;  Location: [facility] ORS;  Service: Urology;  Laterality: N/A;   KNEE ARTHROPLASTY Left 11/07/2022   Procedure: COMPUTER ASSISTED TOTAL KNEE ARTHROPLASTY;  Surgeon: Dereck Leep, MD;  Location: [facility] ORS;  Service: Orthopedics;  Laterality: Left;    Medical History: Past Medical History:  Diagnosis Date   Arthritis    Asthma    Cancer 2003   UTERINE, total Hysterectomy   COPD (chronic obstructive pulmonary disease)    Depression    Hyperlipidemia    Hypertension    Shortness of breath dyspnea    DOE    Family History: Family History  Problem Relation Age of Onset   Kidney disease Mother    Hypertension Sister    Hyperlipidemia Sister    Hyperlipidemia Brother    Hypertension Brother    Kidney cancer Neg Hx    Prostate cancer Neg Hx     Social History   Socioeconomic History   Marital status: Single    Spouse name: Not on file   Number of children: Not on file   Years of education: Not on file   Highest education level: Not on file  Occupational History   Not on file  Tobacco Use   Smoking status: Every Day    Packs/day: 0.25    Years: 25.00    Additional pack years: 0.00    Total pack years: 6.25    Types: Cigarettes    Passive exposure: Current   Smokeless tobacco: Never   Tobacco comments:    3 or 4 cigarettes a day-reported on 10/29/22  Vaping Use   Vaping Use: Never used  Substance and Sexual Activity   Alcohol use: Yes    Comment: rarely   Drug use: No   Sexual activity: Not on file  Other Topics Concern   Not on file  Social History Narrative   Not on file   Social Determinants of Health   Financial Resource Strain: Low Risk  (05/10/2021)   Overall Financial Resource Strain (CARDIA)    Difficulty of  Paying Living Expenses: Not very hard  Food Insecurity: No Food Insecurity (11/07/2022)   Hunger Vital Sign    Worried About Running Out of Food in the Last Year: Never true    Ran Out of Food in the Last Year: Never true  Transportation Needs: No Transportation Needs (11/07/2022)   PRAPARE - Hydrologist (Medical): No    Lack of Transportation (Non-Medical): No  Physical Activity: Not on file  Stress: Not on file  Social Connections: Not on file  Intimate Partner Violence: Not At Risk (11/07/2022)   Humiliation, Afraid, Rape, and Kick questionnaire    Fear of Current or Ex-Partner: No    Emotionally Abused: No    Physically Abused: No    Sexually Abused: No      Review of Systems  Constitutional:  Negative for chills, fatigue and unexpected weight change.  HENT:  Negative for congestion, postnasal drip, rhinorrhea, sneezing and sore throat.   Respiratory:  Negative for cough, chest tightness, shortness of breath and wheezing.   Cardiovascular:  Negative for chest pain and palpitations.  Gastrointestinal:  Negative for abdominal pain, constipation, diarrhea, nausea and vomiting.  Musculoskeletal:  Negative for arthralgias, back pain, joint swelling and neck pain.  Skin:  Negative for rash.  Neurological: Negative.   Psychiatric/Behavioral:  Negative for behavioral problems (Depression), sleep disturbance and suicidal ideas. The patient is not nervous/anxious.     Vital Signs: BP (!) 122/53   Pulse 80   Temp (!) 97.1 F (36.2 C)   Resp 16   Ht 5\' 4"  (1.626 m)   Wt 153 lb 9.6 oz (69.7 kg)   LMP  (LMP Unknown)   SpO2 94%   BMI 26.37 kg/m    Physical Exam Vitals reviewed.  Constitutional:      General: She is not in acute distress.    Appearance: Normal appearance. She is not ill-appearing.  HENT:     Head: Normocephalic and atraumatic.  Eyes:     Pupils: Pupils are equal, round, and reactive to light.  Cardiovascular:     Rate and  Rhythm: Normal rate and regular rhythm.     Heart sounds: Normal heart sounds. No murmur heard.    No friction rub. No gallop.  Pulmonary:     Effort: Pulmonary effort is normal. No respiratory distress.     Breath sounds: Normal breath sounds. No wheezing or rales.  Neurological:     Mental Status: She is alert and oriented to person, place, and time.  Psychiatric:        Mood and Affect: Mood normal.        Behavior: Behavior normal.        Assessment/Plan: 1. Prediabetes A1c improved to normal level. Will repeat A1c at next office visit in October.  - POCT glycosylated hemoglobin (Hb A1C)  2. Obstructive chronic bronchitis without exacerbation 7 day sample of breztri given to patient. Discontinue symbicort. Prescription for breztri sent to pharmacy. Patient will call if she does not want to continue breztri and wants to try something else Refills of albuterol inhaler ordered.  - Budeson-Glycopyrrol-Formoterol (BREZTRI AEROSPHERE) 160-9-4.8 MCG/ACT AERO; Inhale 2 puffs into the lungs in the morning and at bedtime.  Dispense: 10.7 g; Refill: 11 - albuterol (VENTOLIN HFA) 108 (90 Base) MCG/ACT inhaler; Inhale 2 puffs into the lungs every 6 (six) hours as needed for wheezing or shortness of breath. Reported on 11/10/2015  Dispense: 18 g; Refill: 5  3. Need for vaccination - Zoster Vaccine Adjuvanted [facility]) injection; Inject 0.5 mLs into the muscle once for 1 dose.  Dispense: 0.5 mL; Refill: 0   General Counseling: ernisha calabaza understanding of the findings of todays visit and agrees with plan of treatment. I have discussed any further diagnostic evaluation that may be needed or ordered today. We also reviewed her medications today. she has been encouraged to call the office with any questions or concerns that should arise related to todays visit.    Orders Placed This Encounter  Procedures   POCT glycosylated hemoglobin (Hb A1C)    Meds ordered this encounter   Medications   Zoster Vaccine Adjuvanted [facility]) injection    Sig: Inject 0.5 mLs into the muscle once for 1 dose.    Dispense:  0.5 mL    Refill:  0  Budeson-Glycopyrrol-Formoterol (BREZTRI AEROSPHERE) 160-9-4.8 MCG/ACT AERO    Sig: Inhale 2 puffs into the lungs in the morning and at bedtime.    Dispense:  10.7 g    Refill:  11    Discontinue symbicort, fill new script today. Dx code J44.9   albuterol (VENTOLIN HFA) 108 (90 Base) MCG/ACT inhaler    Sig: Inhale 2 puffs into the lungs every 6 (six) hours as needed for wheezing or shortness of breath. Reported on 11/10/2015    Dispense:  18 g    Refill:  5    Patient will call if she needs this filled, please do not fill automatically    Return for CPE, AWV, previously scheduled in october with Anas Reister. .   Total time spent:30 Minutes Time spent includes review of chart, medications, test results, and follow up plan with the patient.   [place] Controlled Substance Database was reviewed by me.  This patient was seen by [name], FNP-C in collaboration with Dr. Clayborn Bigness as a part of collaborative care agreement.   Edmundo Tedesco R. Valetta Fuller, MSN, FNP-C Internal medicine

## 2022-11-28 DIAGNOSIS — Z96652 Presence of left artificial knee joint: Secondary | ICD-10-CM | POA: Diagnosis not present

## 2022-12-03 DIAGNOSIS — M25562 Pain in left knee: Secondary | ICD-10-CM | POA: Diagnosis not present

## 2022-12-03 DIAGNOSIS — Z96652 Presence of left artificial knee joint: Secondary | ICD-10-CM | POA: Diagnosis not present

## 2022-12-05 DIAGNOSIS — Z96652 Presence of left artificial knee joint: Secondary | ICD-10-CM | POA: Diagnosis not present

## 2022-12-11 DIAGNOSIS — M25562 Pain in left knee: Secondary | ICD-10-CM | POA: Diagnosis not present

## 2022-12-11 DIAGNOSIS — Z96652 Presence of left artificial knee joint: Secondary | ICD-10-CM | POA: Diagnosis not present

## 2022-12-13 DIAGNOSIS — M25562 Pain in left knee: Secondary | ICD-10-CM | POA: Diagnosis not present

## 2022-12-13 DIAGNOSIS — Z96652 Presence of left artificial knee joint: Secondary | ICD-10-CM | POA: Diagnosis not present

## 2022-12-22 DIAGNOSIS — Z471 Aftercare following joint replacement surgery: Secondary | ICD-10-CM | POA: Diagnosis not present

## 2022-12-25 DIAGNOSIS — Z96652 Presence of left artificial knee joint: Secondary | ICD-10-CM | POA: Diagnosis not present

## 2022-12-31 ENCOUNTER — Other Ambulatory Visit: Payer: Self-pay

## 2022-12-31 DIAGNOSIS — R0602 Shortness of breath: Secondary | ICD-10-CM

## 2022-12-31 NOTE — Progress Notes (Signed)
01/01/2023 8:05 PM   [name] 1946/02/06 [id number]  Referring provider: Sallyanne Kuster, NP [address] [place],  [place] [address]  Urological history: 1. High risk hematuria -smoker -CTU (2020) - no worrisome findings -cysto (2017) - NED -cysto (2020) - lesion in the dome -bladder biopsy (2021) - cystitis cystica -urine cytology (2022) - negative -urine cytology (2023) - negative -cysto (2023) - NED -urine cytology (09/2022) - negative  Chief Complaint  Patient presents with   Other    HPI: Wendy Garza is a 77 y.o. female who presents today for CATH UA.    At her visit on 10/03/2022, UA yellow slightly cloudy, specific gravity 1.025, 2+ blood, pH 5.0, leukocyte +1, 11-30 WBCs, 11-30 RBCs, greater than 10 epithelial cells, mucus threads present and many bacteria.  PVR 32 mL.  She is having 1-7 daytime urinations, 1-2 episodes of nocturia with a strong urge to urinate.  She has mixed incontinence.  She is leaking 3 more times daily.  She is wearing 3 panty liners daily.  Patient denies any modifying or aggravating factors.  Patient denies any gross hematuria, dysuria or suprapubic/flank pain.  Patient denies any fevers, chills, nausea or vomiting.  She has not taken the Myrbetriq 25 mg daily secondary to the donut hole.  Urine culture MUF.  Cytology negative.  Given Myrbetriq samples.  She presents today for CATH UA for recheck on UA for microscopic hematuria.    CATH UA yellow clear, specific gravity 1.025, 3+blood, pH 5.5, 0-5 WBC's, > 30 RBC's, 0-10 epithelial cells, granular and hyaline casts present, mucus threads present and moderate bacteria.    She circled urgent urination and incontinence on her ROS sheet.     PMH: Past Medical History:  Diagnosis Date   Arthritis    Asthma    Cancer (HCC) 2003   UTERINE, total Hysterectomy   COPD (chronic obstructive pulmonary disease) (HCC)    Depression    Hyperlipidemia    Hypertension    Shortness of  breath dyspnea    DOE    Surgical History: Past Surgical History:  Procedure Laterality Date   ABDOMINAL HYSTERECTOMY     BREAST SURGERY Left    BIOPSY   CATARACT EXTRACTION W/PHACO Left 10/04/2015   Procedure: CATARACT EXTRACTION PHACO AND INTRAOCULAR LENS PLACEMENT (IOC);  Surgeon: Galen Manila, MD;  Location: [facility] ORS;  Service: Ophthalmology;  Laterality: Left;  [place]: 00:46.8 AP%: 21.7 CDE: 10.19 Lot # [id number]   CATARACT EXTRACTION W/PHACO Right 10/25/2015   Procedure: CATARACT EXTRACTION PHACO AND INTRAOCULAR LENS PLACEMENT (IOC);  Surgeon: Galen Manila, MD;  Location: [facility] ORS;  Service: Ophthalmology;  Laterality: Right;  [place] 00:48 AP% 25.2 CDE 12.16 fluid pack lot # [id number]   COLONOSCOPY WITH PROPOFOL N/A 05/28/2016   Procedure: COLONOSCOPY WITH PROPOFOL;  Surgeon: Scot Jun, MD;  Location: [facility] ENDOSCOPY;  Service: Endoscopy;  Laterality: N/A;   COLONOSCOPY WITH PROPOFOL N/A 07/03/2021   Procedure: COLONOSCOPY WITH PROPOFOL;  Surgeon: Toney Reil, MD;  Location: [facility] ENDOSCOPY;  Service: Gastroenterology;  Laterality: N/A;   CYSTOSCOPY WITH BIOPSY N/A 08/31/2019   Procedure: CYSTOSCOPY WITH bladder BIOPSY;  Surgeon: Vanna Scotland, MD;  Location: [facility] ORS;  Service: Urology;  Laterality: N/A;   CYSTOSCOPY WITH FULGERATION N/A 08/31/2019   Procedure: CYSTOSCOPY WITH FULGERATION;  Surgeon: Vanna Scotland, MD;  Location: [facility] ORS;  Service: Urology;  Laterality: N/A;   KNEE ARTHROPLASTY Left 11/07/2022   Procedure: COMPUTER ASSISTED TOTAL KNEE ARTHROPLASTY;  Surgeon:  Hooten, Illene Labrador, MD;  Location: [facility] ORS;  Service: Orthopedics;  Laterality: Left;    Home Medications:  Allergies as of 01/01/2023       Reactions   Aspirin Other (See Comments)   Patient cannot recall what happened   Crab [shellfish Allergy] Hives   Latex Rash   IgE < 0.10 ku/L (WNL; Class 0) on 10/31/2022   Nickel Rash        Medication List        Accurate as of Jan 01, 2023  11:59 PM. If you have any questions, ask your nurse or doctor.          acetaminophen 500 MG tablet Commonly known as: TYLENOL Take 1,000 mg by mouth every 6 (six) hours as needed.   albuterol 108 (90 Base) MCG/ACT inhaler Commonly known as: VENTOLIN HFA Inhale 2 puffs into the lungs every 6 (six) hours as needed for wheezing or shortness of breath. Reported on 11/10/2015   amLODipine 2.5 MG tablet Commonly known as: NORVASC TAKE 1 TABLET BY MOUTH EVERY DAY   Breztri Aerosphere 160-9-4.8 MCG/ACT Aero Generic drug: Budeson-Glycopyrrol-Formoterol Inhale 2 puffs into the lungs in the morning and at bedtime.   celecoxib 200 MG capsule Commonly known as: CELEBREX Take 1 capsule (200 mg total) by mouth 2 (two) times daily.   enoxaparin 40 MG/0.4ML injection Commonly known as: LOVENOX Inject 0.4 mLs (40 mg total) into the skin daily for 14 days.   levothyroxine 50 MCG tablet Commonly known as: SYNTHROID TAKE 1 TABLET BY MOUTH DAILY BEFORE BREAKFAST   metoprolol tartrate 50 MG tablet Commonly known as: LOPRESSOR TAKE 1/2 OF A TABLET BY MOUTH TWICE A DAY   mirabegron ER 25 MG Tb24 tablet Commonly known as: MYRBETRIQ Take 1 tablet (25 mg total) by mouth daily.   oxyCODONE 5 MG immediate release tablet Commonly known as: Oxy IR/ROXICODONE Take 1 tablet (5 mg total) by mouth every 4 (four) hours as needed for moderate pain (pain score 4-6).   rosuvastatin 20 MG tablet Commonly known as: CRESTOR TAKE 1 TABLET BY MOUTH EVERY DAY What changed: when to take this   sertraline 50 MG tablet Commonly known as: ZOLOFT TAKE 1 TABLET BY MOUTH EVERY DAY   traMADol 50 MG tablet Commonly known as: ULTRAM Take 1-2 tablets (50-100 mg total) by mouth every 4 (four) hours as needed for moderate pain.        Allergies:  Allergies  Allergen Reactions   Aspirin Other (See Comments)    Patient cannot recall what happened   Crab [Shellfish Allergy] Hives   Latex Rash    IgE < 0.10  ku/L (WNL; Class 0) on 10/31/2022   Nickel Rash    Family History: Family History  Problem Relation Age of Onset   Kidney disease Mother    Hypertension Sister    Hyperlipidemia Sister    Hyperlipidemia Brother    Hypertension Brother    Kidney cancer Neg Hx    Prostate cancer Neg Hx     Social History:  reports that she has been smoking cigarettes. She has a 6.25 pack-year smoking history. She has been exposed to tobacco smoke. She has never used smokeless tobacco. She reports current alcohol use. She reports that she does not use drugs.  ROS: Pertinent ROS in HPI  Physical Exam: BP 121/75   Pulse 93   Ht 5\' 4"  (1.626 m)   Wt 155 lb (70.3 kg)   LMP  (LMP Unknown)  BMI 26.61 kg/m   Constitutional:  Well nourished. Alert and oriented, No acute distress. HEENT: [place] AT, moist mucus membranes.  Trachea midline Cardiovascular: No clubbing, cyanosis, or edema. Respiratory: Normal respiratory effort, no increased work of breathing. Neurologic: Grossly intact, no focal deficits, moving all 4 extremities. Psychiatric: Normal mood and affect.    Laboratory Data: Lab Results  Component Value Date   WBC 6.4 10/31/2022   HGB 13.8 10/31/2022   HCT 41.3 10/31/2022   MCV 95.4 10/31/2022   PLT 287 10/31/2022    Lab Results  Component Value Date   CREATININE 0.86 10/31/2022    Lab Results  Component Value Date   HGBA1C 5.6 11/26/2022    Lab Results  Component Value Date   TSH 1.500 05/11/2022       Component Value Date/Time   CHOL 103 05/11/2022 0953   HDL 26 (L) 05/11/2022 0953   CHOLHDL 4.0 05/11/2022 0953   LDLCALC 49 05/11/2022 0953    Lab Results  Component Value Date   AST 26 10/31/2022   Lab Results  Component Value Date   ALT 19 10/31/2022    Urinalysis Component     Latest Ref Rng 01/01/2023  Specific Gravity, UA     1.005 - 1.030  1.025   pH, UA     5.0 - 7.5  5.5   Color, UA     Yellow  Yellow   Appearance Ur     Clear  Clear    Leukocytes,UA     Negative  Negative   Protein,UA     Negative/Trace  Negative   Glucose, UA     Negative  Negative   Ketones, UA     Negative  Negative   RBC, UA     Negative  3+ !   Bilirubin, UA     Negative  Negative   Urobilinogen, Ur     0.2 - 1.0 mg/dL 0.2   Nitrite, UA     Negative  Negative   Microscopic Examination See below:     Legend: ! Abnormal  Component     Latest Ref Rng 01/01/2023  WBC, UA     0 - 5 /hpf 0-5   Epithelial Cells (non renal)     0 - 10 /hpf 0-10   Casts     None seen /lpf Present !   Cast Type     N/A  Granular casts !   Mucus, UA     Not Estab.  Present !   Bacteria, UA     None seen/Few  Moderate !   RBC, Urine     0 - 2 /hpf >30 !     Legend: ! Abnormal I have reviewed the labs.   Pertinent Imaging: N/A   Assessment & Plan:    1. High risk hematuria -smoker -work up 2020 - cystitis cystica -no reports of gross heme -CATH [place] with >30 RBC's -urine sent for culture, if culture is positive, will treat with appropriate antibiotic and then reassess, if culture is negative we will need to obtain a RUS and repeat her cysto   Return for pending urine culture results .  These notes generated with voice recognition software. I apologize for typographical errors.  Cloretta Ned  [facility], [facility] [address]  [address] [place], [place] [address] [phone]   I did not see the patient as we were behind in clinic, so she was cathed and follow up  will depend on culture results

## 2023-01-01 ENCOUNTER — Ambulatory Visit (INDEPENDENT_AMBULATORY_CARE_PROVIDER_SITE_OTHER): Payer: PPO | Admitting: Urology

## 2023-01-01 VITALS — BP 121/75 | HR 93 | Ht 64.0 in | Wt 155.0 lb

## 2023-01-01 DIAGNOSIS — R319 Hematuria, unspecified: Secondary | ICD-10-CM | POA: Diagnosis not present

## 2023-01-01 DIAGNOSIS — N3946 Mixed incontinence: Secondary | ICD-10-CM | POA: Diagnosis not present

## 2023-01-01 DIAGNOSIS — R3129 Other microscopic hematuria: Secondary | ICD-10-CM | POA: Diagnosis not present

## 2023-01-01 LAB — URINALYSIS, COMPLETE
Bilirubin, UA: NEGATIVE
Glucose, UA: NEGATIVE
Ketones, UA: NEGATIVE
Leukocytes,UA: NEGATIVE
Nitrite, UA: NEGATIVE
Protein,UA: NEGATIVE
Specific Gravity, UA: 1.025 (ref 1.005–1.030)
Urobilinogen, Ur: 0.2 mg/dL (ref 0.2–1.0)
pH, UA: 5.5 (ref 5.0–7.5)

## 2023-01-01 LAB — MICROSCOPIC EXAMINATION: RBC, Urine: >30 /hpf — AB (ref 0–2)

## 2023-01-02 ENCOUNTER — Encounter: Payer: Self-pay | Admitting: Family Medicine

## 2023-01-02 ENCOUNTER — Ambulatory Visit: Payer: PPO | Admitting: Internal Medicine

## 2023-01-04 LAB — CULTURE, URINE COMPREHENSIVE

## 2023-01-06 ENCOUNTER — Encounter: Payer: Self-pay | Admitting: Urology

## 2023-01-07 ENCOUNTER — Other Ambulatory Visit: Payer: Self-pay | Admitting: Urology

## 2023-01-07 DIAGNOSIS — R3129 Other microscopic hematuria: Secondary | ICD-10-CM

## 2023-01-09 ENCOUNTER — Ambulatory Visit (INDEPENDENT_AMBULATORY_CARE_PROVIDER_SITE_OTHER): Payer: PPO | Admitting: Internal Medicine

## 2023-01-09 DIAGNOSIS — R0602 Shortness of breath: Secondary | ICD-10-CM | POA: Diagnosis not present

## 2023-01-25 ENCOUNTER — Ambulatory Visit
Admission: RE | Admit: 2023-01-25 | Discharge: 2023-01-25 | Disposition: A | Discharge status: Home / Self Care | Payer: PPO | Source: Ambulatory Visit | Attending: Urology | Admitting: Urology

## 2023-01-25 DIAGNOSIS — R3129 Other microscopic hematuria: Secondary | ICD-10-CM | POA: Diagnosis not present

## 2023-02-05 ENCOUNTER — Other Ambulatory Visit: Payer: Self-pay | Admitting: Nurse Practitioner

## 2023-02-05 DIAGNOSIS — E782 Mixed hyperlipidemia: Secondary | ICD-10-CM

## 2023-02-06 ENCOUNTER — Ambulatory Visit: Payer: PPO | Admitting: Urology

## 2023-02-06 VITALS — BP 101/65 | HR 96 | Ht 64.0 in | Wt 155.0 lb

## 2023-02-06 DIAGNOSIS — R3129 Other microscopic hematuria: Secondary | ICD-10-CM | POA: Diagnosis not present

## 2023-02-06 DIAGNOSIS — Z720 Tobacco use: Secondary | ICD-10-CM

## 2023-02-06 LAB — URINALYSIS, COMPLETE
Bilirubin, UA: NEGATIVE
Glucose, UA: NEGATIVE
Ketones, UA: NEGATIVE
Leukocytes,UA: NEGATIVE
Nitrite, UA: NEGATIVE
Protein,UA: NEGATIVE
Specific Gravity, UA: 1.02 (ref 1.005–1.030)
Urobilinogen, Ur: 0.2 mg/dL (ref 0.2–1.0)
pH, UA: 5.5 (ref 5.0–7.5)

## 2023-02-06 LAB — MICROSCOPIC EXAMINATION

## 2023-02-06 NOTE — Progress Notes (Signed)
   02/06/23  CC:  Chief Complaint  Patient presents with   Cysto    HPI: 77 year old smoker with a personal history of persistent microscopic hematuria who presents today for reevaluation of this.  In the interim, she underwent a renal ultrasound which was unremarkable.  She continues to smoke.   Blood pressure 101/65, pulse 96, height 5\' 4"  (1.626 m), weight 155 lb (70.3 kg). NED. A&Ox3.   No respiratory distress   Abd soft, NT, ND Normal external genitalia with patent urethral meatus  Cystoscopy Procedure Note  Patient identification was confirmed, informed consent was obtained, and patient was prepped using Betadine solution.  Lidocaine jelly was administered per urethral meatus.    Procedure: - Flexible cystoscope introduced, without any difficulty.   - Thorough search of the bladder revealed:    normal urethral meatus    normal urothelium    no stones    no ulcers     no tumors    no urethral polyps    Mild lateral wall trabeculation  - Ureteral orifices were normal in position and appearance.  Post-Procedure: - Patient tolerated the procedure well  Assessment/ Plan:  1. Microscopic hematuria Evaluation is negative including cystoscopy today and renal ultrasound - Urinalysis, Complete  2. Tobacco abuse Importance of smoking cessation was reviewed again today.  We discussed the causative relationship between that, bladder cancer and need for continued workup in the event of microscopic hematuria because of her risk factors.  She understands.  F/u shannon in 1 year with UA/ PVR  Vanna Scotland, MD

## 2023-02-21 NOTE — Procedures (Signed)
[facility] [address] [place], [address]    Complete Pulmonary Function Testing Interpretation:  FINDINGS:  The forced vital capacity is mildly decreased.  FEV1 is 1.54 L which is 74% of predicted and is mildly decreased.  FEV1 FVC ratio was decreased.  Postbronchodilator no significant change in FEV1 however clinical improvement may occur in the absence of spirometric improvement.  Total lung capacity is mildly decreased.  Residual volume is decreased.  Residual volume total capacity ratio was decreased.  FRC is decreased.  DLCO is mildly decreased.  IMPRESSION:  This pulmonary function study is consistent with mild restrictive lung disease.  Clinical correlation is recommended  Yevonne Pax, MD [facility] Sleep Medicine

## 2023-02-25 LAB — PULMONARY FUNCTION TEST

## 2023-03-04 DIAGNOSIS — Z9849 Cataract extraction status, unspecified eye: Secondary | ICD-10-CM | POA: Diagnosis not present

## 2023-03-04 DIAGNOSIS — E039 Hypothyroidism, unspecified: Secondary | ICD-10-CM | POA: Diagnosis not present

## 2023-03-04 DIAGNOSIS — E785 Hyperlipidemia, unspecified: Secondary | ICD-10-CM | POA: Diagnosis not present

## 2023-03-04 DIAGNOSIS — J4489 Other specified chronic obstructive pulmonary disease: Secondary | ICD-10-CM | POA: Diagnosis not present

## 2023-03-04 DIAGNOSIS — I10 Essential (primary) hypertension: Secondary | ICD-10-CM | POA: Diagnosis not present

## 2023-03-04 DIAGNOSIS — F17210 Nicotine dependence, cigarettes, uncomplicated: Secondary | ICD-10-CM | POA: Diagnosis not present

## 2023-03-04 DIAGNOSIS — J45909 Unspecified asthma, uncomplicated: Secondary | ICD-10-CM | POA: Diagnosis not present

## 2023-03-04 DIAGNOSIS — M1990 Unspecified osteoarthritis, unspecified site: Secondary | ICD-10-CM | POA: Diagnosis not present

## 2023-03-04 DIAGNOSIS — R32 Unspecified urinary incontinence: Secondary | ICD-10-CM | POA: Diagnosis not present

## 2023-03-04 DIAGNOSIS — F411 Generalized anxiety disorder: Secondary | ICD-10-CM | POA: Diagnosis not present

## 2023-03-04 DIAGNOSIS — E663 Overweight: Secondary | ICD-10-CM | POA: Diagnosis not present

## 2023-03-04 DIAGNOSIS — G250 Essential tremor: Secondary | ICD-10-CM | POA: Diagnosis not present

## 2023-03-11 ENCOUNTER — Other Ambulatory Visit: Payer: Self-pay | Admitting: Nurse Practitioner

## 2023-03-11 DIAGNOSIS — F411 Generalized anxiety disorder: Secondary | ICD-10-CM

## 2023-03-21 ENCOUNTER — Other Ambulatory Visit: Payer: Self-pay | Admitting: Nurse Practitioner

## 2023-03-21 DIAGNOSIS — E038 Other specified hypothyroidism: Secondary | ICD-10-CM

## 2023-03-21 DIAGNOSIS — I10 Essential (primary) hypertension: Secondary | ICD-10-CM

## 2023-05-02 ENCOUNTER — Encounter: Payer: Self-pay | Admitting: Physician Assistant

## 2023-05-02 ENCOUNTER — Ambulatory Visit (INDEPENDENT_AMBULATORY_CARE_PROVIDER_SITE_OTHER): Payer: PPO | Admitting: Physician Assistant

## 2023-05-02 VITALS — BP 130/80 | HR 77 | Temp 97.8°F | Resp 16 | Ht 64.0 in | Wt 156.0 lb

## 2023-05-02 DIAGNOSIS — J4489 Other specified chronic obstructive pulmonary disease: Secondary | ICD-10-CM | POA: Diagnosis not present

## 2023-05-02 DIAGNOSIS — F17218 Nicotine dependence, cigarettes, with other nicotine-induced disorders: Secondary | ICD-10-CM | POA: Diagnosis not present

## 2023-05-02 NOTE — Progress Notes (Signed)
[facility] [address] [place], [place] [address]  Pulmonary Sleep Medicine   Office Visit Note  Patient Name: Wendy Garza DOB: 1946-02-20 MRN [id number]  Date of Service: 05/02/2023  Complaints/HPI: Pt is here for routine pulmonary follow up for PFT review. Breathing has been good. Has not needed rescue inhaler. Using breztri and has not had any problems with this. PFT a little improved from last year. Still smoking 3-4 cigs per day. Has been smoking most of her life. Due for CT lung screening.  ROS  General: (-) fever, (-) chills, (-) night sweats, (-) weakness Skin: (-) rashes, (-) itching,. Eyes: (-) visual changes, (-) redness, (-) itching. Nose and Sinuses: (-) nasal stuffiness or itchiness, (-) postnasal drip, (-) nosebleeds, (-) sinus trouble. Mouth and Throat: (-) sore throat, (-) hoarseness. Neck: (-) swollen glands, (-) enlarged thyroid, (-) neck pain. Respiratory: - cough, (-) bloody sputum, - shortness of breath, - wheezing. Cardiovascular: - ankle swelling, (-) chest pain. Lymphatic: (-) lymph node enlargement. Neurologic: (-) numbness, (-) tingling. Psychiatric: (-) anxiety, (-) depression   Current Medication: Outpatient Encounter Medications as of 05/02/2023  Medication Sig   acetaminophen (TYLENOL) 500 MG tablet Take 1,000 mg by mouth every 6 (six) hours as needed.   albuterol (VENTOLIN HFA) 108 (90 Base) MCG/ACT inhaler Inhale 2 puffs into the lungs every 6 (six) hours as needed for wheezing or shortness of breath. Reported on 11/10/2015   amLODipine (NORVASC) 2.5 MG tablet TAKE 1 TABLET BY MOUTH EVERY DAY   Budeson-Glycopyrrol-Formoterol (BREZTRI AEROSPHERE) 160-9-4.8 MCG/ACT AERO Inhale 2 puffs into the lungs in the morning and at bedtime.   celecoxib (CELEBREX) 200 MG capsule Take 1 capsule (200 mg total) by mouth 2 (two) times daily.   levothyroxine (SYNTHROID) 50 MCG tablet TAKE 1 TABLET BY MOUTH EVERY DAY BEFORE BREAKFAST   metoprolol  tartrate (LOPRESSOR) 50 MG tablet TAKE 1/2 OF A TABLET BY MOUTH TWICE A DAY   mirabegron ER (MYRBETRIQ) 25 MG TB24 tablet Take 1 tablet (25 mg total) by mouth daily.   oxyCODONE (OXY IR/ROXICODONE) 5 MG immediate release tablet Take 1 tablet (5 mg total) by mouth every 4 (four) hours as needed for moderate pain (pain score 4-6).   rosuvastatin (CRESTOR) 20 MG tablet TAKE 1 TABLET BY MOUTH EVERY DAY   sertraline (ZOLOFT) 50 MG tablet TAKE 1 TABLET BY MOUTH EVERY DAY   traMADol (ULTRAM) 50 MG tablet Take 1-2 tablets (50-100 mg total) by mouth every 4 (four) hours as needed for moderate pain.   enoxaparin (LOVENOX) 40 MG/0.4ML injection Inject 0.4 mLs (40 mg total) into the skin daily for 14 days.   No facility-administered encounter medications on file as of 05/02/2023.    Surgical History: Past Surgical History:  Procedure Laterality Date   ABDOMINAL HYSTERECTOMY     BREAST SURGERY Left    BIOPSY   CATARACT EXTRACTION W/PHACO Left 10/04/2015   Procedure: CATARACT EXTRACTION PHACO AND INTRAOCULAR LENS PLACEMENT (IOC);  Surgeon: Galen Manila, MD;  Location: [facility] ORS;  Service: Ophthalmology;  Laterality: Left;  [place]: 00:46.8 AP%: 21.7 CDE: 10.19 Lot # [id number]   CATARACT EXTRACTION W/PHACO Right 10/25/2015   Procedure: CATARACT EXTRACTION PHACO AND INTRAOCULAR LENS PLACEMENT (IOC);  Surgeon: Galen Manila, MD;  Location: [facility] ORS;  Service: Ophthalmology;  Laterality: Right;  [place] 00:48 AP% 25.2 CDE 12.16 fluid pack lot # [id number]   COLONOSCOPY WITH PROPOFOL N/A 05/28/2016   Procedure: COLONOSCOPY WITH PROPOFOL;  Surgeon: Scot Jun, MD;  Location: [facility] ENDOSCOPY;  Service: Endoscopy;  Laterality: N/A;   COLONOSCOPY WITH PROPOFOL N/A 07/03/2021   Procedure: COLONOSCOPY WITH PROPOFOL;  Surgeon: Toney Reil, MD;  Location: [facility] ENDOSCOPY;  Service: Gastroenterology;  Laterality: N/A;   CYSTOSCOPY WITH BIOPSY N/A 08/31/2019   Procedure: CYSTOSCOPY WITH bladder BIOPSY;   Surgeon: Vanna Scotland, MD;  Location: [facility] ORS;  Service: Urology;  Laterality: N/A;   CYSTOSCOPY WITH FULGERATION N/A 08/31/2019   Procedure: CYSTOSCOPY WITH FULGERATION;  Surgeon: Vanna Scotland, MD;  Location: [facility] ORS;  Service: Urology;  Laterality: N/A;   KNEE ARTHROPLASTY Left 11/07/2022   Procedure: COMPUTER ASSISTED TOTAL KNEE ARTHROPLASTY;  Surgeon: Donato Heinz, MD;  Location: [facility] ORS;  Service: Orthopedics;  Laterality: Left;    Medical History: Past Medical History:  Diagnosis Date   Arthritis    Asthma    Cancer (HCC) 2003   UTERINE, total Hysterectomy   COPD (chronic obstructive pulmonary disease) (HCC)    Depression    Hyperlipidemia    Hypertension    Shortness of breath dyspnea    DOE    Family History: Family History  Problem Relation Age of Onset   Kidney disease Mother    Hypertension Sister    Hyperlipidemia Sister    Hyperlipidemia Brother    Hypertension Brother    Kidney cancer Neg Hx    Prostate cancer Neg Hx     Social History: Social History   Socioeconomic History   Marital status: Single    Spouse name: Not on file   Number of children: Not on file   Years of education: Not on file   Highest education level: Not on file  Occupational History   Not on file  Tobacco Use   Smoking status: Every Day    Current packs/day: 0.25    Average packs/day: 0.3 packs/day for 25.0 years (6.3 ttl pk-yrs)    Types: Cigarettes    Passive exposure: Current   Smokeless tobacco: Never   Tobacco comments:    3 or 4 cigarettes a day-reported on 10/29/22  Vaping Use   Vaping status: Never Used  Substance and Sexual Activity   Alcohol use: Yes    Comment: rarely   Drug use: No   Sexual activity: Not on file  Other Topics Concern   Not on file  Social History Narrative   Not on file   Social Determinants of Health   Financial Resource Strain: Low Risk  (05/10/2021)   Overall Financial Resource Strain (CARDIA)    Difficulty of Paying Living  Expenses: Not very hard  Food Insecurity: No Food Insecurity (11/07/2022)   Hunger Vital Sign    Worried About Running Out of Food in the Last Year: Never true    Ran Out of Food in the Last Year: Never true  Transportation Needs: No Transportation Needs (11/07/2022)   PRAPARE - Administrator, Civil Service (Medical): No    Lack of Transportation (Non-Medical): No  Physical Activity: Not on file  Stress: Not on file  Social Connections: Not on file  Intimate Partner Violence: Not At Risk (11/07/2022)   Humiliation, Afraid, Rape, and Kick questionnaire    Fear of Current or Ex-Partner: No    Emotionally Abused: No    Physically Abused: No    Sexually Abused: No    Vital Signs: Blood pressure 130/80, pulse 77, temperature 97.8 F (36.6 C), resp. rate 16, height 5\' 4"  (1.626 m), weight 156 lb (70.8 kg), SpO2 96%.  Examination: General Appearance: The patient is well-developed, well-nourished, and in no distress. Skin: Gross inspection of skin unremarkable. Head: normocephalic, no gross deformities. Eyes: no gross deformities noted. ENT: ears appear grossly normal no exudates. Neck: Supple. No thyromegaly. No LAD. Respiratory: No rhonchi. Cardiovascular: Normal S1 and S2 without murmur or rub. Extremities: No cyanosis. pulses are equal. Neurologic: Alert and oriented. No involuntary movements.  LABS: Recent Results (from the past 2160 hour(s))  Urinalysis, Complete     Status: Abnormal   Collection Time: 02/06/23  2:29 PM  Result Value Ref Range   Specific Gravity, UA 1.020 1.005 - 1.030   pH, UA 5.5 5.0 - 7.5   Color, UA Yellow Yellow   Appearance Ur Clear Clear   Leukocytes,UA Negative Negative   Protein,UA Negative Negative/Trace   Glucose, UA Negative Negative   Ketones, UA Negative Negative   RBC, UA 2+ (A) Negative   Bilirubin, UA Negative Negative   Urobilinogen, Ur 0.2 0.2 - 1.0 mg/dL   Nitrite, UA Negative Negative   Microscopic Examination See  below:   Microscopic Examination     Status: Abnormal   Collection Time: 02/06/23  2:29 PM   Urine  Result Value Ref Range   WBC, UA 0-5 0 - 5 /hpf   RBC, Urine 11-30 (A) 0 - 2 /hpf   Epithelial Cells (non renal) 0-10 0 - 10 /hpf   Casts Present (A) None seen /lpf   Cast Type Hyaline casts N/A   Mucus, UA Present (A) Not Estab.   Bacteria, UA Many (A) None seen/Few  Pulmonary Function Test     Status: None   Collection Time: 02/25/23  8:26 AM  Result Value Ref Range   FEV1     FVC     FEV1/FVC     TLC     DLCO      Radiology: US RENAL  Result Date: 01/25/2023 CLINICAL DATA:  Microscopic hematuria EXAM: RENAL / URINARY TRACT ULTRASOUND COMPLETE COMPARISON:  Renal ultrasound 09/23/2021 FINDINGS: Right Kidney: Renal measurements: 10.0 x 3.3 x 4.0 cm = volume: 69.4 mL. Echogenicity within normal limits. No mass or hydronephrosis visualized. Left Kidney: Renal measurements: 10.6 x 5.3 x 4.2 cm. = volume: 123.9 mL. Echogenicity within normal limits. No mass or hydronephrosis visualized. Bladder: Appears normal for degree of bladder distention. Other: None. IMPRESSION: No hydronephrosis. Electronically Signed   By: Annia Belt M.D.   On: 01/25/2023 12:13    No results found.  No results found.    Assessment and Plan: Patient Active Problem List   Diagnosis Date Noted   Total knee replacement status 11/07/2022   Primary osteoarthritis of left knee 05/13/2022   History of colonic polyps    Subclinical hypothyroidism 01/20/2021   Prediabetes 01/20/2021   Cutaneous candidiasis 06/04/2020   Need for shingles vaccine 06/04/2020   Hematuria 11/18/2019   Atherosclerosis of aorta (HCC) 11/18/2019   Encounter for general adult medical examination with abnormal findings 05/31/2019   Benign essential tremor 05/31/2019   Need for vaccination against Streptococcus pneumoniae using pneumococcal conjugate vaccine 7 05/31/2019   Dysuria 05/31/2019   Encounter for hepatitis C screening test  for low risk patient 05/31/2019   Screening for breast cancer 05/21/2018   Needs flu shot 05/21/2018   Obstructive chronic bronchitis without exacerbation 12/03/2017   Nicotine dependence, cigarettes, uncomplicated 12/03/2017   Mild intermittent asthma without complication 10/17/2017   Vitamin D deficiency 10/17/2017   Mixed hyperlipidemia 10/08/2017   Essential hypertension 10/08/2017  Generalized anxiety disorder 10/08/2017   SUI (stress urinary incontinence, female) 11/12/2015   Microscopic hematuria 11/12/2015   Urge incontinence 11/12/2015    1. Obstructive chronic bronchitis without exacerbation Continue inhalers as before, will update CT screening - CT CHEST LUNG CA SCREEN LOW DOSE W/O CM; Future  2. Cigarette nicotine dependence with other nicotine-induced disorder Continue to work on cessation, will update CT screening - CT CHEST LUNG CA SCREEN LOW DOSE W/O CM; Future   General Counseling: I have discussed the findings of the evaluation and examination with Wendy Garza.  I have also discussed any further diagnostic evaluation thatmay be needed or ordered today. Wendy Garza verbalizes understanding of the findings of todays visit. We also reviewed her medications today and discussed drug interactions and side effects including but not limited excessive drowsiness and altered mental states. We also discussed that there is always a risk not just to her but also people around her. she has been encouraged to call the office with any questions or concerns that should arise related to todays visit.  Orders Placed This Encounter  Procedures   CT CHEST LUNG CA SCREEN LOW DOSE W/O CM    Standing Status:   Future    Standing Expiration Date:   05/01/2024    Order Specific Question:   Preferred Imaging Location?    Answer:   [facility]     Time spent: 30  I have personally obtained a history, examined the patient, evaluated laboratory and imaging results, formulated the assessment and plan and  placed orders. This patient was seen by Lynn Ito, PA-C in collaboration with Dr. Freda Munro as a part of collaborative care agreement.     Yevonne Pax, MD [facility] Pulmonary and Critical Care Sleep medicine

## 2023-05-14 ENCOUNTER — Ambulatory Visit
Admission: RE | Admit: 2023-05-14 | Discharge: 2023-05-14 | Disposition: A | Discharge status: Home / Self Care | Payer: PPO | Source: Ambulatory Visit | Attending: Physician Assistant

## 2023-05-14 DIAGNOSIS — J4489 Other specified chronic obstructive pulmonary disease: Secondary | ICD-10-CM

## 2023-05-14 DIAGNOSIS — F17218 Nicotine dependence, cigarettes, with other nicotine-induced disorders: Secondary | ICD-10-CM

## 2023-05-14 DIAGNOSIS — Z87891 Personal history of nicotine dependence: Secondary | ICD-10-CM | POA: Diagnosis not present

## 2023-05-30 ENCOUNTER — Encounter: Payer: Self-pay | Admitting: Nurse Practitioner

## 2023-05-30 ENCOUNTER — Telehealth: Payer: Self-pay | Admitting: Nurse Practitioner

## 2023-05-30 ENCOUNTER — Ambulatory Visit (INDEPENDENT_AMBULATORY_CARE_PROVIDER_SITE_OTHER): Payer: PPO | Admitting: Nurse Practitioner

## 2023-05-30 VITALS — BP 132/76 | HR 74 | Temp 98.3°F | Resp 16 | Ht 64.0 in | Wt 158.0 lb

## 2023-05-30 DIAGNOSIS — Z2911 Encounter for prophylactic immunotherapy for respiratory syncytial virus (RSV): Secondary | ICD-10-CM

## 2023-05-30 DIAGNOSIS — M8589 Other specified disorders of bone density and structure, multiple sites: Secondary | ICD-10-CM | POA: Diagnosis not present

## 2023-05-30 DIAGNOSIS — Z0000 Encounter for general adult medical examination without abnormal findings: Secondary | ICD-10-CM

## 2023-05-30 DIAGNOSIS — Z23 Encounter for immunization: Secondary | ICD-10-CM | POA: Diagnosis not present

## 2023-05-30 DIAGNOSIS — E038 Other specified hypothyroidism: Secondary | ICD-10-CM | POA: Diagnosis not present

## 2023-05-30 DIAGNOSIS — R911 Solitary pulmonary nodule: Secondary | ICD-10-CM

## 2023-05-30 DIAGNOSIS — I700 Atherosclerosis of aorta: Secondary | ICD-10-CM

## 2023-05-30 MED ORDER — AREXVY 120 MCG/0.5ML IM SUSR
0.5000 mL | Freq: Once | INTRAMUSCULAR | 0 refills | Status: AC
Start: 2023-05-30 — End: 2023-05-30

## 2023-05-30 NOTE — Progress Notes (Signed)
[facility] [address] [place], [place] [address]  Internal MEDICINE  Office Visit Note  Patient Name: Wendy Garza  [id number]  [id number]  Date of Service: 05/30/2023  Chief Complaint  Patient presents with   Depression   Hypertension   Hyperlipidemia   Medicare Wellness    HPI Wendy Garza presents for a medicare annual well visit.  Well-appearing 77 y.o. female with hypertension, asthma, hypothyroidism, hyperlipidemia, and GAD.  Routine CRC screening: done in November 2022, due in 2029 Routine mammogram: scheduled for November at [facility] DEXA scan was done in 2018, repeat to check status Labs: has had a lot of labs done this year via her specialists, will defer until next office visit  New or worsening pain: none  Other concerns: due for lung cancer screening, requesting flu vaccine and RSV vaccine.      05/30/2023   11:05 AM 05/24/2022   10:54 AM 05/17/2021   11:29 AM  MMSE - Mini Mental State Exam  Orientation to time 5 5 5   Orientation to Place 5 5 5   Registration 3 3 3   Attention/ Calculation 5 5 5   Recall 3 3 3   Language- name 2 objects 2 2 2   Language- repeat 1 1 1   Language- follow 3 step command 3 3 3   Language- read & follow direction 1 1 1   Write a sentence 1 1 1   Copy design 1 1 1   Total score 30 30 30     Functional Status Survey: Is the patient deaf or have difficulty hearing?: No Does the patient have difficulty seeing, even when wearing glasses/contacts?: No Does the patient have difficulty concentrating, remembering, or making decisions?: No Does the patient have difficulty walking or climbing stairs?: Yes Does the patient have difficulty dressing or bathing?: No Does the patient have difficulty doing errands alone such as visiting a doctor's office or shopping?: No     05/17/2021   11:28 AM 07/03/2021    7:06 AM 12/14/2021   10:54 AM 05/24/2022   10:52 AM 05/30/2023   11:04 AM  Fall Risk  Falls in the past year? 0  0 0 0  Was there an  injury with Fall?    0 0  Fall Risk Category Calculator    0 0  Fall Risk Category (Retired)    Low   (RETIRED) Patient Fall Risk Level Low fall risk Moderate fall risk Low fall risk Low fall risk   Patient at Risk for Falls Due to No Fall Risks  No Fall Risks No Fall Risks No Fall Risks  Fall risk Follow up Falls evaluation completed  Falls evaluation completed Falls evaluation completed Falls evaluation completed       05/30/2023   11:04 AM  Depression screen PHQ 2/9  Decreased Interest 0  Down, Depressed, Hopeless 0  PHQ - 2 Score 0       Current Medication: Outpatient Encounter Medications as of 05/30/2023  Medication Sig   acetaminophen (TYLENOL) 500 MG tablet Take 1,000 mg by mouth every 6 (six) hours as needed.   albuterol (VENTOLIN HFA) 108 (90 Base) MCG/ACT inhaler Inhale 2 puffs into the lungs every 6 (six) hours as needed for wheezing or shortness of breath. Reported on 11/10/2015   amLODipine (NORVASC) 2.5 MG tablet TAKE 1 TABLET BY MOUTH EVERY DAY   Budeson-Glycopyrrol-Formoterol (BREZTRI AEROSPHERE) 160-9-4.8 MCG/ACT AERO Inhale 2 puffs into the lungs in the morning and at bedtime.   celecoxib (CELEBREX) 200 MG capsule Take 1 capsule (  200 mg total) by mouth 2 (two) times daily.   levothyroxine (SYNTHROID) 50 MCG tablet TAKE 1 TABLET BY MOUTH EVERY DAY BEFORE BREAKFAST   metoprolol tartrate (LOPRESSOR) 50 MG tablet TAKE 1/2 OF A TABLET BY MOUTH TWICE A DAY   mirabegron ER (MYRBETRIQ) 25 MG TB24 tablet Take 1 tablet (25 mg total) by mouth daily.   oxyCODONE (OXY IR/ROXICODONE) 5 MG immediate release tablet Take 1 tablet (5 mg total) by mouth every 4 (four) hours as needed for moderate pain (pain score 4-6).   [EXPIRED] RSV vaccine recomb adjuvanted (AREXVY) 120 MCG/0.5ML injection Inject 0.5 mLs into the muscle once for 1 dose.   sertraline (ZOLOFT) 50 MG tablet TAKE 1 TABLET BY MOUTH EVERY DAY   traMADol (ULTRAM) 50 MG tablet Take 1-2 tablets (50-100 mg total) by mouth  every 4 (four) hours as needed for moderate pain.   [DISCONTINUED] rosuvastatin (CRESTOR) 20 MG tablet TAKE 1 TABLET BY MOUTH EVERY DAY   enoxaparin (LOVENOX) 40 MG/0.4ML injection Inject 0.4 mLs (40 mg total) into the skin daily for 14 days.   No facility-administered encounter medications on file as of 05/30/2023.    Surgical History: Past Surgical History:  Procedure Laterality Date   ABDOMINAL HYSTERECTOMY     BREAST SURGERY Left    BIOPSY   CATARACT EXTRACTION W/PHACO Left 10/04/2015   Procedure: CATARACT EXTRACTION PHACO AND INTRAOCULAR LENS PLACEMENT (IOC);  Surgeon: Galen Manila, MD;  Location: [facility] ORS;  Service: Ophthalmology;  Laterality: Left;  [place]: 00:46.8 AP%: 21.7 CDE: 10.19 Lot # [id number]   CATARACT EXTRACTION W/PHACO Right 10/25/2015   Procedure: CATARACT EXTRACTION PHACO AND INTRAOCULAR LENS PLACEMENT (IOC);  Surgeon: Galen Manila, MD;  Location: [facility] ORS;  Service: Ophthalmology;  Laterality: Right;  [place] 00:48 AP% 25.2 CDE 12.16 fluid pack lot # [id number]   COLONOSCOPY WITH PROPOFOL N/A 05/28/2016   Procedure: COLONOSCOPY WITH PROPOFOL;  Surgeon: Scot Jun, MD;  Location: [facility] ENDOSCOPY;  Service: Endoscopy;  Laterality: N/A;   COLONOSCOPY WITH PROPOFOL N/A 07/03/2021   Procedure: COLONOSCOPY WITH PROPOFOL;  Surgeon: Toney Reil, MD;  Location: [facility] ENDOSCOPY;  Service: Gastroenterology;  Laterality: N/A;   CYSTOSCOPY WITH BIOPSY N/A 08/31/2019   Procedure: CYSTOSCOPY WITH bladder BIOPSY;  Surgeon: Vanna Scotland, MD;  Location: [facility] ORS;  Service: Urology;  Laterality: N/A;   CYSTOSCOPY WITH FULGERATION N/A 08/31/2019   Procedure: CYSTOSCOPY WITH FULGERATION;  Surgeon: Vanna Scotland, MD;  Location: [facility] ORS;  Service: Urology;  Laterality: N/A;   KNEE ARTHROPLASTY Left 11/07/2022   Procedure: COMPUTER ASSISTED TOTAL KNEE ARTHROPLASTY;  Surgeon: Donato Heinz, MD;  Location: [facility] ORS;  Service: Orthopedics;  Laterality: Left;    Medical  History: Past Medical History:  Diagnosis Date   Arthritis    Asthma    Cancer (HCC) 2003   UTERINE, total Hysterectomy   COPD (chronic obstructive pulmonary disease) (HCC)    Depression    Hyperlipidemia    Hypertension    Shortness of breath dyspnea    DOE    Family History: Family History  Problem Relation Age of Onset   Kidney disease Mother    Hypertension Sister    Hyperlipidemia Sister    Hyperlipidemia Brother    Hypertension Brother    Kidney cancer Neg Hx    Prostate cancer Neg Hx     Social History   Socioeconomic History   Marital status: Single    Spouse name: Not on file   Number of children: Not on  file   Years of education: Not on file   Highest education level: Not on file  Occupational History   Not on file  Tobacco Use   Smoking status: Every Day    Current packs/day: 0.25    Average packs/day: 0.3 packs/day for 25.0 years (6.3 ttl pk-yrs)    Types: Cigarettes    Passive exposure: Current   Smokeless tobacco: Never   Tobacco comments:    3 or 4 cigarettes a day-reported on 10/29/22  Vaping Use   Vaping status: Never Used  Substance and Sexual Activity   Alcohol use: Yes    Comment: rarely   Drug use: No   Sexual activity: Not on file  Other Topics Concern   Not on file  Social History Narrative   Not on file   Social Determinants of Health   Financial Resource Strain: Low Risk  (05/10/2021)   Overall Financial Resource Strain (CARDIA)    Difficulty of Paying Living Expenses: Not very hard  Food Insecurity: No Food Insecurity (11/07/2022)   Hunger Vital Sign    Worried About Running Out of Food in the Last Year: Never true    Ran Out of Food in the Last Year: Never true  Transportation Needs: No Transportation Needs (11/07/2022)   PRAPARE - Administrator, Civil Service (Medical): No    Lack of Transportation (Non-Medical): No  Physical Activity: Not on file  Stress: Not on file  Social Connections: Not on file   Intimate Partner Violence: Not At Risk (11/07/2022)   Humiliation, Afraid, Rape, and Kick questionnaire    Fear of Current or Ex-Partner: No    Emotionally Abused: No    Physically Abused: No    Sexually Abused: No      Review of Systems  Constitutional:  Negative for activity change, appetite change, chills, fatigue, fever and unexpected weight change.  HENT: Negative.  Negative for congestion, ear pain, rhinorrhea, sore throat and trouble swallowing.   Eyes: Negative.   Respiratory: Negative.  Negative for cough, chest tightness, shortness of breath and wheezing.   Cardiovascular: Negative.  Negative for chest pain.  Gastrointestinal: Negative.  Negative for abdominal pain, blood in stool, constipation, diarrhea, nausea and vomiting.  Endocrine: Negative.   Genitourinary: Negative.  Negative for difficulty urinating, dysuria, frequency, hematuria and urgency.  Musculoskeletal: Negative.  Negative for arthralgias, back pain, joint swelling, myalgias and neck pain.  Skin: Negative.  Negative for rash and wound.  Allergic/Immunologic: Negative.  Negative for immunocompromised state.  Neurological: Negative.  Negative for dizziness, seizures, numbness and headaches.  Hematological: Negative.   Psychiatric/Behavioral: Negative.  Negative for behavioral problems, self-injury and suicidal ideas. The patient is not nervous/anxious.     Vital Signs: BP 132/76   Pulse 74   Temp 98.3 F (36.8 C)   Resp 16   Ht 5\' 4"  (1.626 m)   Wt 158 lb (71.7 kg)   LMP  (LMP Unknown)   SpO2 94%   BMI 27.12 kg/m    Physical Exam Constitutional:      General: She is awake. She is not in acute distress.    Appearance: Normal appearance. She is well-developed, well-groomed and normal weight. She is not ill-appearing or diaphoretic.  HENT:     Head: Normocephalic and atraumatic.     Right Ear: Tympanic membrane, ear canal and external ear normal.     Left Ear: Tympanic membrane, ear canal and  external ear normal.     Nose:  Nose normal. No congestion or rhinorrhea.     Mouth/Throat:     Lips: Pink.     Mouth: Mucous membranes are moist.     Pharynx: Oropharynx is clear. Uvula midline. No oropharyngeal exudate or posterior oropharyngeal erythema.  Eyes:     General: Lids are normal. Vision grossly intact. Gaze aligned appropriately. No scleral icterus.       Right eye: No discharge.        Left eye: No discharge.     Extraocular Movements: Extraocular movements intact.     Conjunctiva/sclera: Conjunctivae normal.     Pupils: Pupils are equal, round, and reactive to light.     Funduscopic exam:    Right eye: Red reflex present.        Left eye: Red reflex present. Neck:     Thyroid: No thyromegaly.     Vascular: No carotid bruit or JVD.     Trachea: Trachea and phonation normal. No tracheal deviation.  Cardiovascular:     Rate and Rhythm: Normal rate and regular rhythm.     Pulses: Normal pulses.     Heart sounds: Normal heart sounds, S1 normal and S2 normal. No murmur heard.    No friction rub. No gallop.  Pulmonary:     Effort: Pulmonary effort is normal. No accessory muscle usage or respiratory distress.     Breath sounds: Normal breath sounds and air entry. No stridor. No wheezing or rales.  Chest:     Chest wall: No tenderness.     Comments: Declined clinical breast exam, gets yearly mammograms.  Abdominal:     General: Bowel sounds are normal. There is no distension.     Palpations: Abdomen is soft. There is no shifting dullness, fluid wave, mass or pulsatile mass.     Tenderness: There is no abdominal tenderness. There is no guarding or rebound.  Musculoskeletal:        General: No tenderness or deformity. Normal range of motion.     Cervical back: Normal range of motion and neck supple.     Right lower leg: No edema.     Left lower leg: No edema.  Lymphadenopathy:     Cervical: No cervical adenopathy.  Skin:    General: Skin is warm and dry.     Capillary  Refill: Capillary refill takes less than 2 seconds.     Coloration: Skin is not pale.     Findings: No erythema or rash.  Neurological:     Mental Status: She is alert and oriented to person, place, and time.     Cranial Nerves: No cranial nerve deficit.     Motor: No abnormal muscle tone.     Coordination: Coordination normal.     Gait: Gait normal.     Deep Tendon Reflexes: Reflexes are normal and symmetric.  Psychiatric:        Mood and Affect: Mood and affect normal.        Behavior: Behavior normal. Behavior is cooperative.        Thought Content: Thought content normal.        Judgment: Judgment normal.        Assessment/Plan: 1. Encounter for annual wellness visit (AWV) in Medicare patient Age-appropriate preventive screenings and vaccinations discussed, annual physical exam completed. Routine labs for health maintenance deferred until early next year. PHM updated.   2. Atherosclerosis of aorta (HCC) Continue rosuvastatin 20 mg daily as prescribed.   3. Subclinical hypothyroidism Continue levothyroxine as  prescribed.   4. Osteopenia of multiple sites DEXA scan ordered - DG Bone Density; Future  5. Pulmonary nodule less than 6 mm determined by computed tomography of lung CT chest ordered for follow up lung cancer screening  - CT CHEST NODULE FOLLOW UP LOW DOSE W/O; Future  6. Need for vaccination Flu vaccine administered in office today - Influenza, MDCK, trivalent, PF(Flucelvax egg-free) - RSV vaccine recomb adjuvanted (AREXVY) 120 MCG/0.5ML injection; Inject 0.5 mLs into the muscle once for 1 dose.  Dispense: 0.5 mL; Refill: 0      General Counseling: alyzah trevino understanding of the findings of todays visit and agrees with plan of treatment. I have discussed any further diagnostic evaluation that may be needed or ordered today. We also reviewed her medications today. she has been encouraged to call the office with any questions or concerns that should  arise related to todays visit.    Orders Placed This Encounter  Procedures   DG Bone Density   CT CHEST NODULE FOLLOW UP LOW DOSE W/O   Influenza, MDCK, trivalent, PF(Flucelvax egg-free)    Meds ordered this encounter  Medications   RSV vaccine recomb adjuvanted (AREXVY) 120 MCG/0.5ML injection    Sig: Inject 0.5 mLs into the muscle once for 1 dose.    Dispense:  0.5 mL    Refill:  0    Return in about 6 months (around 11/28/2023) for F/U, Drayson Dorko PCP repeat CT scan results and labs .   Total time spent:30 Minutes Time spent includes review of chart, medications, test results, and follow up plan with the patient.   [place] Controlled Substance Database was reviewed by me.  This patient was seen by Sallyanne Kuster, FNP-C in collaboration with Dr. Beverely Risen as a part of collaborative care agreement.  Kaiana Marion R. Tedd Sias, MSN, FNP-C Internal medicine

## 2023-05-30 NOTE — Telephone Encounter (Signed)
Lvm to schedule 6 month follow up-Toni

## 2023-06-17 ENCOUNTER — Other Ambulatory Visit: Payer: Self-pay | Admitting: Nurse Practitioner

## 2023-06-17 DIAGNOSIS — E782 Mixed hyperlipidemia: Secondary | ICD-10-CM

## 2023-06-18 ENCOUNTER — Other Ambulatory Visit: Payer: Self-pay | Admitting: Nurse Practitioner

## 2023-07-15 DIAGNOSIS — Z1231 Encounter for screening mammogram for malignant neoplasm of breast: Secondary | ICD-10-CM | POA: Diagnosis not present

## 2023-07-20 ENCOUNTER — Encounter: Payer: Self-pay | Admitting: Nurse Practitioner

## 2023-07-20 DIAGNOSIS — M8589 Other specified disorders of bone density and structure, multiple sites: Secondary | ICD-10-CM | POA: Insufficient documentation

## 2023-09-13 ENCOUNTER — Other Ambulatory Visit: Payer: Self-pay | Admitting: Nurse Practitioner

## 2023-09-13 DIAGNOSIS — I10 Essential (primary) hypertension: Secondary | ICD-10-CM

## 2023-09-13 DIAGNOSIS — E038 Other specified hypothyroidism: Secondary | ICD-10-CM

## 2023-09-15 ENCOUNTER — Other Ambulatory Visit: Payer: Self-pay | Admitting: Physician Assistant

## 2023-09-15 ENCOUNTER — Other Ambulatory Visit: Payer: Self-pay | Admitting: Nurse Practitioner

## 2023-09-15 DIAGNOSIS — I10 Essential (primary) hypertension: Secondary | ICD-10-CM

## 2023-09-15 DIAGNOSIS — F411 Generalized anxiety disorder: Secondary | ICD-10-CM

## 2023-10-04 DIAGNOSIS — H43813 Vitreous degeneration, bilateral: Secondary | ICD-10-CM | POA: Diagnosis not present

## 2023-10-04 DIAGNOSIS — H02889 Meibomian gland dysfunction of unspecified eye, unspecified eyelid: Secondary | ICD-10-CM | POA: Diagnosis not present

## 2023-10-09 ENCOUNTER — Other Ambulatory Visit: Payer: PPO

## 2023-10-29 DIAGNOSIS — Z96652 Presence of left artificial knee joint: Secondary | ICD-10-CM | POA: Diagnosis not present

## 2023-11-13 ENCOUNTER — Ambulatory Visit
Admission: RE | Admit: 2023-11-13 | Discharge: 2023-11-13 | Disposition: A | Discharge status: Home / Self Care | Payer: PPO | Source: Ambulatory Visit | Attending: Nurse Practitioner | Admitting: Nurse Practitioner

## 2023-11-13 ENCOUNTER — Other Ambulatory Visit: Payer: Self-pay | Admitting: Nurse Practitioner

## 2023-11-13 DIAGNOSIS — J219 Acute bronchiolitis, unspecified: Secondary | ICD-10-CM | POA: Diagnosis not present

## 2023-11-13 DIAGNOSIS — J432 Centrilobular emphysema: Secondary | ICD-10-CM | POA: Diagnosis not present

## 2023-11-13 DIAGNOSIS — R911 Solitary pulmonary nodule: Secondary | ICD-10-CM

## 2023-11-13 DIAGNOSIS — I700 Atherosclerosis of aorta: Secondary | ICD-10-CM

## 2023-11-13 DIAGNOSIS — E038 Other specified hypothyroidism: Secondary | ICD-10-CM

## 2023-11-13 DIAGNOSIS — M8589 Other specified disorders of bone density and structure, multiple sites: Secondary | ICD-10-CM

## 2023-11-13 DIAGNOSIS — Z0000 Encounter for general adult medical examination without abnormal findings: Secondary | ICD-10-CM

## 2023-11-13 DIAGNOSIS — F17210 Nicotine dependence, cigarettes, uncomplicated: Secondary | ICD-10-CM | POA: Diagnosis not present

## 2023-11-13 DIAGNOSIS — Z23 Encounter for immunization: Secondary | ICD-10-CM

## 2023-11-28 ENCOUNTER — Ambulatory Visit: Payer: PPO | Admitting: Nurse Practitioner

## 2023-12-02 ENCOUNTER — Encounter: Payer: Self-pay | Admitting: Nurse Practitioner

## 2023-12-02 ENCOUNTER — Ambulatory Visit (INDEPENDENT_AMBULATORY_CARE_PROVIDER_SITE_OTHER): Payer: PPO | Admitting: Nurse Practitioner

## 2023-12-02 VITALS — BP 130/70 | HR 78 | Temp 98.3°F | Resp 16 | Ht 64.0 in | Wt 165.8 lb

## 2023-12-02 DIAGNOSIS — E559 Vitamin D deficiency, unspecified: Secondary | ICD-10-CM | POA: Diagnosis not present

## 2023-12-02 DIAGNOSIS — J4489 Other specified chronic obstructive pulmonary disease: Secondary | ICD-10-CM | POA: Diagnosis not present

## 2023-12-02 DIAGNOSIS — R7303 Prediabetes: Secondary | ICD-10-CM | POA: Diagnosis not present

## 2023-12-02 DIAGNOSIS — M8589 Other specified disorders of bone density and structure, multiple sites: Secondary | ICD-10-CM

## 2023-12-02 DIAGNOSIS — E782 Mixed hyperlipidemia: Secondary | ICD-10-CM

## 2023-12-02 DIAGNOSIS — I10 Essential (primary) hypertension: Secondary | ICD-10-CM | POA: Diagnosis not present

## 2023-12-02 DIAGNOSIS — I700 Atherosclerosis of aorta: Secondary | ICD-10-CM | POA: Diagnosis not present

## 2023-12-02 DIAGNOSIS — E038 Other specified hypothyroidism: Secondary | ICD-10-CM | POA: Diagnosis not present

## 2023-12-02 MED ORDER — ALBUTEROL SULFATE HFA 108 (90 BASE) MCG/ACT IN AERS
2.0000 | INHALATION_SPRAY | Freq: Four times a day (QID) | RESPIRATORY_TRACT | 5 refills | Status: AC | PRN
Start: 2023-12-02 — End: ?

## 2023-12-02 MED ORDER — ROSUVASTATIN CALCIUM 20 MG PO TABS: 20.0000 mg | ORAL_TABLET | Freq: Every day | ORAL | 1 refills | Status: DC

## 2023-12-02 MED ORDER — BREZTRI AEROSPHERE 160-9-4.8 MCG/ACT IN AERO
2.0000 | INHALATION_SPRAY | Freq: Two times a day (BID) | RESPIRATORY_TRACT | 11 refills | Status: AC
Start: 2023-12-02 — End: ?

## 2023-12-02 NOTE — Progress Notes (Unsigned)
 [facility] [address] [place], [place] [address]  Internal MEDICINE  Office Visit Note  Patient Name: Wendy Garza  [id number]  [id number]  Date of Service: 12/02/2023  Chief Complaint  Patient presents with   Depression   Hypertension   Hyperlipidemia   Follow-up    HPI Wendy Garza presents for a follow-up visit for  CT chest nodule follow up -- had this done over 2 weeks ago and the final report is not available.  High cholesterol -- Hypertension Hypothyroidism     Current Medication: Outpatient Encounter Medications as of 12/02/2023  Medication Sig   acetaminophen (TYLENOL) 500 MG tablet Take 1,000 mg by mouth every 6 (six) hours as needed.   albuterol (VENTOLIN HFA) 108 (90 Base) MCG/ACT inhaler Inhale 2 puffs into the lungs every 6 (six) hours as needed for wheezing or shortness of breath. Reported on 11/10/2015   amLODipine (NORVASC) 2.5 MG tablet TAKE 1 TABLET BY MOUTH EVERY DAY   Budeson-Glycopyrrol-Formoterol (BREZTRI AEROSPHERE) 160-9-4.8 MCG/ACT AERO Inhale 2 puffs into the lungs in the morning and at bedtime.   celecoxib (CELEBREX) 200 MG capsule Take 1 capsule (200 mg total) by mouth 2 (two) times daily.   levothyroxine (SYNTHROID) 50 MCG tablet TAKE 1 TABLET BY MOUTH EVERY DAY BEFORE BREAKFAST   metoprolol tartrate (LOPRESSOR) 50 MG tablet TAKE 1/2 TABLET TWICE A DAY BY MOUTH   mirabegron ER (MYRBETRIQ) 25 MG TB24 tablet Take 1 tablet (25 mg total) by mouth daily.   oxyCODONE (OXY IR/ROXICODONE) 5 MG immediate release tablet Take 1 tablet (5 mg total) by mouth every 4 (four) hours as needed for moderate pain (pain score 4-6).   rosuvastatin (CRESTOR) 20 MG tablet TAKE 1 TABLET BY MOUTH EVERY DAY   sertraline (ZOLOFT) 50 MG tablet TAKE 1 TABLET BY MOUTH EVERY DAY   SYMBICORT 80-4.5 MCG/ACT inhaler INHALE 2 PUFFS INTO THE LUNGS TWICE A DAY   traMADol (ULTRAM) 50 MG tablet Take 1-2 tablets (50-100 mg total) by mouth every 4 (four) hours as needed for  moderate pain.   enoxaparin (LOVENOX) 40 MG/0.4ML injection Inject 0.4 mLs (40 mg total) into the skin daily for 14 days.   No facility-administered encounter medications on file as of 12/02/2023.    Surgical History: Past Surgical History:  Procedure Laterality Date   ABDOMINAL HYSTERECTOMY     BREAST SURGERY Left    BIOPSY   CATARACT EXTRACTION W/PHACO Left 10/04/2015   Procedure: CATARACT EXTRACTION PHACO AND INTRAOCULAR LENS PLACEMENT (IOC);  Surgeon: Galen Manila, MD;  Location: [facility] ORS;  Service: Ophthalmology;  Laterality: Left;  [place]: 00:46.8 AP%: 21.7 CDE: 10.19 Lot # [id number]   CATARACT EXTRACTION W/PHACO Right 10/25/2015   Procedure: CATARACT EXTRACTION PHACO AND INTRAOCULAR LENS PLACEMENT (IOC);  Surgeon: Galen Manila, MD;  Location: [facility] ORS;  Service: Ophthalmology;  Laterality: Right;  [place] 00:48 AP% 25.2 CDE 12.16 fluid pack lot # [id number]   COLONOSCOPY WITH PROPOFOL N/A 05/28/2016   Procedure: COLONOSCOPY WITH PROPOFOL;  Surgeon: Scot Jun, MD;  Location: [facility] ENDOSCOPY;  Service: Endoscopy;  Laterality: N/A;   COLONOSCOPY WITH PROPOFOL N/A 07/03/2021   Procedure: COLONOSCOPY WITH PROPOFOL;  Surgeon: Toney Reil, MD;  Location: [facility] ENDOSCOPY;  Service: Gastroenterology;  Laterality: N/A;   CYSTOSCOPY WITH BIOPSY N/A 08/31/2019   Procedure: CYSTOSCOPY WITH bladder BIOPSY;  Surgeon: Vanna Scotland, MD;  Location: [facility] ORS;  Service: Urology;  Laterality: N/A;   CYSTOSCOPY WITH FULGERATION N/A 08/31/2019   Procedure: CYSTOSCOPY WITH FULGERATION;  Surgeon: Vanna Scotland, MD;  Location: [facility] ORS;  Service: Urology;  Laterality: N/A;   KNEE ARTHROPLASTY Left 11/07/2022   Procedure: COMPUTER ASSISTED TOTAL KNEE ARTHROPLASTY;  Surgeon: Donato Heinz, MD;  Location: [facility] ORS;  Service: Orthopedics;  Laterality: Left;    Medical History: Past Medical History:  Diagnosis Date   Arthritis    Asthma    Cancer (HCC) 2003   UTERINE, total Hysterectomy    COPD (chronic obstructive pulmonary disease) (HCC)    Depression    Hyperlipidemia    Hypertension    Shortness of breath dyspnea    DOE    Family History: Family History  Problem Relation Age of Onset   Kidney disease Mother    Hypertension Sister    Hyperlipidemia Sister    Hyperlipidemia Brother    Hypertension Brother    Kidney cancer Neg Hx    Prostate cancer Neg Hx     Social History   Socioeconomic History   Marital status: Single    Spouse name: Not on file   Number of children: Not on file   Years of education: Not on file   Highest education level: Not on file  Occupational History   Not on file  Tobacco Use   Smoking status: Every Day    Current packs/day: 0.25    Average packs/day: 0.3 packs/day for 25.0 years (6.3 ttl pk-yrs)    Types: Cigarettes    Passive exposure: Current   Smokeless tobacco: Never   Tobacco comments:    3 or 4 cigarettes a day-reported on 10/29/22  Vaping Use   Vaping status: Never Used  Substance and Sexual Activity   Alcohol use: Yes    Comment: rarely   Drug use: No   Sexual activity: Not on file  Other Topics Concern   Not on file  Social History Narrative   Not on file   Social Drivers of Health   Financial Resource Strain: Low Risk  (05/10/2021)   Overall Financial Resource Strain (CARDIA)    Difficulty of Paying Living Expenses: Not very hard  Food Insecurity: No Food Insecurity (11/07/2022)   Hunger Vital Sign    Worried About Running Out of Food in the Last Year: Never true    Ran Out of Food in the Last Year: Never true  Transportation Needs: No Transportation Needs (11/07/2022)   PRAPARE - Administrator, Civil Service (Medical): No    Lack of Transportation (Non-Medical): No  Physical Activity: Not on file  Stress: Not on file  Social Connections: Not on file  Intimate Partner Violence: Not At Risk (11/07/2022)   Humiliation, Afraid, Rape, and Kick questionnaire    Fear of Current or Ex-Partner:  No    Emotionally Abused: No    Physically Abused: No    Sexually Abused: No      Review of Systems  Vital Signs: BP 130/70   Pulse 78   Temp 98.3 F (36.8 C)   Resp 16   Ht 5\' 4"  (1.626 m)   Wt 165 lb 12.8 oz (75.2 kg)   LMP  (LMP Unknown)   SpO2 94%   BMI 28.46 kg/m    Physical Exam     Assessment/Plan:   General Counseling: Wendy Garza verbalizes understanding of the findings of todays visit and agrees with plan of treatment. I have discussed any further diagnostic evaluation that may be needed or ordered today. We also reviewed her medications today. she has been encouraged to  call the office with any questions or concerns that should arise related to todays visit.    No orders of the defined types were placed in this encounter.   No orders of the defined types were placed in this encounter.   No follow-ups on file.   Total time spent:*** Minutes Time spent includes review of chart, medications, test results, and follow up plan with the patient.   [place] Controlled Substance Database was reviewed by me.  This patient was seen by Sallyanne Kuster, FNP-C in collaboration with Dr. Beverely Risen as a part of collaborative care agreement.   Charlita Brian R. Tedd Sias, MSN, FNP-C Internal medicine

## 2023-12-03 ENCOUNTER — Encounter: Payer: Self-pay | Admitting: Nurse Practitioner

## 2023-12-04 ENCOUNTER — Telehealth: Payer: Self-pay

## 2023-12-04 NOTE — Telephone Encounter (Signed)
 Sent patient Mychart message.

## 2023-12-04 NOTE — Progress Notes (Signed)
 Please let the patient know that the CT chest scan is stable when compared to the scan 6 months ago. There are no new or suspicious nodules and no changes in the nodules that are already there. Repeat CT chest lung cancer screening in 12 months.  Incidental findings of emphysema and aortic atherosclerosis but these were present on previous scans.   Sorry for the delay, the final report was posted today around noon

## 2024-02-06 ENCOUNTER — Encounter: Payer: Self-pay | Admitting: Urology

## 2024-02-06 ENCOUNTER — Ambulatory Visit: Payer: Self-pay | Admitting: Urology

## 2024-02-06 VITALS — BP 143/77 | HR 84

## 2024-02-06 DIAGNOSIS — R3129 Other microscopic hematuria: Secondary | ICD-10-CM

## 2024-02-06 DIAGNOSIS — N393 Stress incontinence (female) (male): Secondary | ICD-10-CM | POA: Diagnosis not present

## 2024-02-06 LAB — MICROSCOPIC EXAMINATION

## 2024-02-06 LAB — BLADDER SCAN AMB NON-IMAGING: Scan Result: 1

## 2024-02-06 NOTE — Progress Notes (Signed)
 02/06/2024 2:59 PM   Wendy Garza 1946-03-04 [id number]  Referring provider: Laurence Pons, NP [address] [place],  [place] [address]  Urological history: 1. High risk hematuria -smoker -CTU (2020) - no worrisome findings -cysto (2017) - NED -cysto (2020) - lesion in the dome -bladder biopsy (2021) - cystitis cystica -urine cytology (2022) - negative -urine cytology (2023) - negative -cysto (2023) - NED -urine cytology (09/2022) - negative -RUS (12/2022) - NED -cysto (01/2023) - NED   Chief Complaint  Patient presents with   High risk hematuria   HPI: Wendy Garza is a 78 y.o. female who presents today for follow up.   Previous records reviewed.     She is having 1-7 daytime voids, 1-2 episodes of nocturia with a mild urge to urinate.  She has stress urinary incontinence leaking 1-2 times daily.  She wears 1-2 panty liners daily she does not limit fluid intake and she does not engage in toilet mapping.  Patient denies any modifying or aggravating factors.  Patient denies any recent UTI's, gross hematuria, dysuria or suprapubic/flank pain.  Patient denies any fevers, chills, nausea or vomiting.    PVR 1 mL   UA yellow clear, specific gravity 1.025, pH 6.0, 2+ blood, 0-5 WBCs, 11-30 RBCs, greater than 10 epithelial cells and a few bacteria.   She was asked by staff that she had splitting of the urinary stream, when she states that she had since her hysterectomy.  It is not bothersome to her.  And it happens very rarely.   PMH: Past Medical History:  Diagnosis Date   Arthritis    Asthma    Cancer (HCC) 2003   UTERINE, total Hysterectomy   COPD (chronic obstructive pulmonary disease) (HCC)    Depression    Hyperlipidemia    Hypertension    Shortness of breath dyspnea    DOE    Surgical History: Past Surgical History:  Procedure Laterality Date   ABDOMINAL HYSTERECTOMY     BREAST SURGERY Left    BIOPSY   CATARACT EXTRACTION W/PHACO Left  10/04/2015   Procedure: CATARACT EXTRACTION PHACO AND INTRAOCULAR LENS PLACEMENT (IOC);  Surgeon: Clair Crews, MD;  Location: [facility] ORS;  Service: Ophthalmology;  Laterality: Left;  US : 00:46.8 AP%: 21.7 CDE: 10.19 Lot # [id number]   CATARACT EXTRACTION W/PHACO Right 10/25/2015   Procedure: CATARACT EXTRACTION PHACO AND INTRAOCULAR LENS PLACEMENT (IOC);  Surgeon: Clair Crews, MD;  Location: [facility] ORS;  Service: Ophthalmology;  Laterality: Right;  US  00:48 AP% 25.2 CDE 12.16 fluid pack lot # [id number]   COLONOSCOPY WITH PROPOFOL  N/A 05/28/2016   Procedure: COLONOSCOPY WITH PROPOFOL ;  Surgeon: Cassie Click, MD;  Location: [facility] ENDOSCOPY;  Service: Endoscopy;  Laterality: N/A;   COLONOSCOPY WITH PROPOFOL  N/A 07/03/2021   Procedure: COLONOSCOPY WITH PROPOFOL ;  Surgeon: Selena Daily, MD;  Location: [facility] ENDOSCOPY;  Service: Gastroenterology;  Laterality: N/A;   CYSTOSCOPY WITH BIOPSY N/A 08/31/2019   Procedure: CYSTOSCOPY WITH bladder BIOPSY;  Surgeon: Dustin Gimenez, MD;  Location: [facility] ORS;  Service: Urology;  Laterality: N/A;   CYSTOSCOPY WITH FULGERATION N/A 08/31/2019   Procedure: CYSTOSCOPY WITH FULGERATION;  Surgeon: Dustin Gimenez, MD;  Location: [facility] ORS;  Service: Urology;  Laterality: N/A;   KNEE ARTHROPLASTY Left 11/07/2022   Procedure: COMPUTER ASSISTED TOTAL KNEE ARTHROPLASTY;  Surgeon: Arlyne Lame, MD;  Location: [facility] ORS;  Service: Orthopedics;  Laterality: Left;    Home Medications:  Allergies as of 02/06/2024  Reactions   Aspirin Other (See Comments)   Patient cannot recall what happened   Crab [shellfish Allergy] Hives   Latex Rash   IgE < 0.10 ku/L (WNL; Class 0) on 10/31/2022   Nickel Rash        Medication List        Accurate as of February 06, 2024  2:59 PM. If you have any questions, ask your nurse or doctor.          acetaminophen  500 MG tablet Commonly known as: TYLENOL  Take 1,000 mg by mouth every 6 (six) hours as needed.    albuterol  108 (90 Base) MCG/ACT inhaler Commonly known as: VENTOLIN  HFA Inhale 2 puffs into the lungs every 6 (six) hours as needed for wheezing or shortness of breath. Reported on 11/10/2015   amLODipine  2.5 MG tablet Commonly known as: NORVASC  TAKE 1 TABLET BY MOUTH EVERY DAY   Breztri  Aerosphere 160-9-4.8 MCG/ACT Aero inhaler Generic drug: budesonide -glycopyrrolate -formoterol  Inhale 2 puffs into the lungs in the morning and at bedtime.   levothyroxine  50 MCG tablet Commonly known as: SYNTHROID  TAKE 1 TABLET BY MOUTH EVERY DAY BEFORE BREAKFAST   metoprolol  tartrate 50 MG tablet Commonly known as: LOPRESSOR  TAKE 1/2 TABLET TWICE A DAY BY MOUTH   rosuvastatin  20 MG tablet Commonly known as: CRESTOR  Take 1 tablet (20 mg total) by mouth daily.   sertraline  50 MG tablet Commonly known as: ZOLOFT  TAKE 1 TABLET BY MOUTH EVERY DAY        Allergies:  Allergies  Allergen Reactions   Aspirin Other (See Comments)    Patient cannot recall what happened   Crab [Shellfish Allergy] Hives   Latex Rash    IgE < 0.10 ku/L (WNL; Class 0) on 10/31/2022   Nickel Rash    Family History: Family History  Problem Relation Age of Onset   Kidney disease Mother    Hypertension Sister    Hyperlipidemia Sister    Hyperlipidemia Brother    Hypertension Brother    Kidney cancer Neg Hx    Prostate cancer Neg Hx     Social History:  reports that she has been smoking cigarettes. She has a 6.3 pack-year smoking history. She has been exposed to tobacco smoke. She has never used smokeless tobacco. She reports current alcohol use. She reports that she does not use drugs.  ROS: Pertinent ROS in HPI  Physical Exam: BP (!) 143/77   Pulse 84   LMP  (LMP Unknown)   Constitutional:  Well nourished. Alert and oriented, No acute distress. HEENT: [place] AT, moist mucus membranes.  Trachea midline Cardiovascular: No clubbing, cyanosis, or edema. Respiratory: Normal respiratory effort, no increased work  of breathing. Neurologic: Grossly intact, no focal deficits, moving all 4 extremities. Psychiatric: Normal mood and affect.    Laboratory Data: Urinalysis See EPIC and HPI  I have reviewed the labs.   Pertinent Imaging:  02/06/24 14:24  Scan Result 1 ml     Assessment & Plan:    1. High risk hematuria -smoker -work up 2020 - cystitis cystica -work up 2024 - NED -no reports of gross heme -UA 11-30 RBC's -will place a referral to nephrology for further evaluation   2. SUI - Not bothersome at this time - Explained that the split urinary stream is likely due to a cystocele as she also is experiencing stress urinary continence - Since is not bothersome to her, we will discontinue to monitor -Recommended Kegel exercises and given handout in AVS  Return in about 1 year (around 02/05/2025) for UA, OAB questionnaire, PVR .  These notes generated with voice recognition software. I apologize for typographical errors.  Briant Camper  [facility] [address]  [address] [place], [place] [address] [phone]

## 2024-02-07 LAB — URINALYSIS, COMPLETE
Bilirubin, UA: NEGATIVE
Glucose, UA: NEGATIVE
Ketones, UA: NEGATIVE
Leukocytes,UA: NEGATIVE
Nitrite, UA: NEGATIVE
Protein,UA: NEGATIVE
Specific Gravity, UA: 1.025 (ref 1.005–1.030)
Urobilinogen, Ur: 0.2 mg/dL (ref 0.2–1.0)
pH, UA: 6 (ref 5.0–7.5)

## 2024-02-07 LAB — MICROSCOPIC EXAMINATION: Epithelial Cells (non renal): >10 /HPF — AB (ref 0–10)

## 2024-03-05 DIAGNOSIS — R8290 Unspecified abnormal findings in urine: Secondary | ICD-10-CM | POA: Diagnosis not present

## 2024-03-05 DIAGNOSIS — R319 Hematuria, unspecified: Secondary | ICD-10-CM | POA: Diagnosis not present

## 2024-03-05 DIAGNOSIS — Z720 Tobacco use: Secondary | ICD-10-CM | POA: Diagnosis not present

## 2024-03-05 DIAGNOSIS — I10 Essential (primary) hypertension: Secondary | ICD-10-CM | POA: Diagnosis not present

## 2024-03-05 DIAGNOSIS — E785 Hyperlipidemia, unspecified: Secondary | ICD-10-CM | POA: Diagnosis not present

## 2024-03-05 DIAGNOSIS — R809 Proteinuria, unspecified: Secondary | ICD-10-CM | POA: Insufficient documentation

## 2024-03-10 ENCOUNTER — Other Ambulatory Visit: Payer: Self-pay | Admitting: Nurse Practitioner

## 2024-03-10 DIAGNOSIS — F411 Generalized anxiety disorder: Secondary | ICD-10-CM

## 2024-03-10 DIAGNOSIS — E038 Other specified hypothyroidism: Secondary | ICD-10-CM

## 2024-03-10 DIAGNOSIS — I10 Essential (primary) hypertension: Secondary | ICD-10-CM

## 2024-04-02 DIAGNOSIS — I10 Essential (primary) hypertension: Secondary | ICD-10-CM | POA: Diagnosis not present

## 2024-04-02 DIAGNOSIS — R809 Proteinuria, unspecified: Secondary | ICD-10-CM | POA: Diagnosis not present

## 2024-04-02 DIAGNOSIS — Z720 Tobacco use: Secondary | ICD-10-CM | POA: Diagnosis not present

## 2024-04-02 DIAGNOSIS — R319 Hematuria, unspecified: Secondary | ICD-10-CM | POA: Diagnosis not present

## 2024-04-02 DIAGNOSIS — E785 Hyperlipidemia, unspecified: Secondary | ICD-10-CM | POA: Diagnosis not present

## 2024-04-08 DIAGNOSIS — E785 Hyperlipidemia, unspecified: Secondary | ICD-10-CM | POA: Diagnosis not present

## 2024-04-08 DIAGNOSIS — R319 Hematuria, unspecified: Secondary | ICD-10-CM | POA: Diagnosis not present

## 2024-04-08 DIAGNOSIS — R809 Proteinuria, unspecified: Secondary | ICD-10-CM | POA: Diagnosis not present

## 2024-04-08 DIAGNOSIS — I10 Essential (primary) hypertension: Secondary | ICD-10-CM | POA: Diagnosis not present

## 2024-04-08 DIAGNOSIS — Z720 Tobacco use: Secondary | ICD-10-CM | POA: Diagnosis not present

## 2024-05-18 NOTE — Progress Notes (Signed)
   05/18/2024  Patient ID: Wendy Garza, female   DOB: 1945/11/14, 78 y.o.   MRN: [id number]  Pharmacy Quality Measure Review  This patient is appearing on a report for being at risk of failing the adherence measure for cholesterol (statin) medications this calendar year.   Medication: Rosuvastatin  Last fill date: 03/11/24 for 90 day supply  Insurance report was not up to date. No action needed at this time.   Jon VEAR Lindau, PharmD Clinical Pharmacist [phone]

## 2024-05-25 DIAGNOSIS — R7303 Prediabetes: Secondary | ICD-10-CM | POA: Diagnosis not present

## 2024-05-25 DIAGNOSIS — I10 Essential (primary) hypertension: Secondary | ICD-10-CM | POA: Diagnosis not present

## 2024-05-25 DIAGNOSIS — M8589 Other specified disorders of bone density and structure, multiple sites: Secondary | ICD-10-CM | POA: Diagnosis not present

## 2024-05-25 DIAGNOSIS — E559 Vitamin D deficiency, unspecified: Secondary | ICD-10-CM | POA: Diagnosis not present

## 2024-05-25 DIAGNOSIS — I700 Atherosclerosis of aorta: Secondary | ICD-10-CM | POA: Diagnosis not present

## 2024-05-25 DIAGNOSIS — E038 Other specified hypothyroidism: Secondary | ICD-10-CM | POA: Diagnosis not present

## 2024-05-25 LAB — LIPID PANEL

## 2024-05-26 DIAGNOSIS — D2272 Melanocytic nevi of left lower limb, including hip: Secondary | ICD-10-CM | POA: Diagnosis not present

## 2024-05-26 DIAGNOSIS — L570 Actinic keratosis: Secondary | ICD-10-CM | POA: Diagnosis not present

## 2024-05-26 DIAGNOSIS — R208 Other disturbances of skin sensation: Secondary | ICD-10-CM | POA: Diagnosis not present

## 2024-05-26 DIAGNOSIS — D2262 Melanocytic nevi of left upper limb, including shoulder: Secondary | ICD-10-CM | POA: Diagnosis not present

## 2024-05-26 DIAGNOSIS — D2261 Melanocytic nevi of right upper limb, including shoulder: Secondary | ICD-10-CM | POA: Diagnosis not present

## 2024-05-26 DIAGNOSIS — D2271 Melanocytic nevi of right lower limb, including hip: Secondary | ICD-10-CM | POA: Diagnosis not present

## 2024-05-26 DIAGNOSIS — D225 Melanocytic nevi of trunk: Secondary | ICD-10-CM | POA: Diagnosis not present

## 2024-05-26 DIAGNOSIS — L821 Other seborrheic keratosis: Secondary | ICD-10-CM | POA: Diagnosis not present

## 2024-05-26 DIAGNOSIS — D485 Neoplasm of uncertain behavior of skin: Secondary | ICD-10-CM | POA: Diagnosis not present

## 2024-05-26 DIAGNOSIS — L820 Inflamed seborrheic keratosis: Secondary | ICD-10-CM | POA: Diagnosis not present

## 2024-05-26 DIAGNOSIS — D0439 Carcinoma in situ of skin of other parts of face: Secondary | ICD-10-CM | POA: Diagnosis not present

## 2024-05-26 LAB — CMP14+EGFR
ALT: 16 IU/L (ref 0–32)
AST: 23 IU/L (ref 0–40)
Albumin: 4.4 g/dL (ref 3.8–4.8)
Alkaline Phosphatase: 70 IU/L (ref 49–135)
BUN/Creatinine Ratio: 16 (ref 12–28)
BUN: 16 mg/dL (ref 8–27)
Bilirubin Total: 0.4 mg/dL (ref 0.0–1.2)
CO2: 21 mmol/L (ref 20–29)
Calcium: 9.5 mg/dL (ref 8.7–10.3)
Chloride: 104 mmol/L (ref 96–106)
Creatinine, Ser: 1.02 mg/dL — AB (ref 0.57–1.00)
Globulin, Total: 2.5 g/dL (ref 1.5–4.5)
Glucose: 123 mg/dL — AB (ref 70–99)
Potassium: 4.5 mmol/L (ref 3.5–5.2)
Sodium: 139 mmol/L (ref 134–144)
Total Protein: 6.9 g/dL (ref 6.0–8.5)
eGFR: 57 mL/min/1.73 — AB (ref >59)

## 2024-05-26 LAB — CBC WITH DIFFERENTIAL/PLATELET
Basophils Absolute: 0.1 x10E3/uL (ref 0.0–0.2)
Basos: 1 %
EOS (ABSOLUTE): 0.4 x10E3/uL (ref 0.0–0.4)
Eos: 4 %
Hematocrit: 43.8 % (ref 34.0–46.6)
Hemoglobin: 14.1 g/dL (ref 11.1–15.9)
Immature Grans (Abs): 0.1 x10E3/uL (ref 0.0–0.1)
Immature Granulocytes: 1 %
Lymphocytes Absolute: 2.3 x10E3/uL (ref 0.7–3.1)
Lymphs: 25 %
MCH: 32 pg (ref 26.6–33.0)
MCHC: 32.2 g/dL (ref 31.5–35.7)
MCV: 100 fL — ABNORMAL HIGH (ref 79–97)
Monocytes Absolute: 1 x10E3/uL — ABNORMAL HIGH (ref 0.1–0.9)
Monocytes: 11 %
Neutrophils Absolute: 5.3 x10E3/uL (ref 1.4–7.0)
Neutrophils: 58 %
Platelets: 315 x10E3/uL (ref 150–450)
RBC: 4.4 x10E6/uL (ref 3.77–5.28)
RDW: 13.2 % (ref 11.7–15.4)
WBC: 9.1 x10E3/uL (ref 3.4–10.8)

## 2024-05-26 LAB — LIPID PANEL
Cholesterol, Total: 118 mg/dL (ref 100–199)
HDL: 26 mg/dL — AB (ref >39)
LDL CALC COMMENT:: 4.5 ratio — AB (ref 0.0–4.4)
LDL Chol Calc (NIH): 59 mg/dL (ref 0–99)
Triglycerides: 196 mg/dL — AB (ref 0–149)
VLDL Cholesterol Cal: 33 mg/dL (ref 5–40)

## 2024-05-26 LAB — HGB A1C W/O EAG: Hgb A1c MFr Bld: 6 % — AB (ref 4.8–5.6)

## 2024-05-26 LAB — TSH+FREE T4
Free T4: 1.19 ng/dL (ref 0.82–1.77)
TSH: 2.46 u[IU]/mL (ref 0.450–4.500)

## 2024-05-26 LAB — VITAMIN D 25 HYDROXY (VIT D DEFICIENCY, FRACTURES): Vit D, 25-Hydroxy: 19.2 ng/mL — AB (ref 30.0–100.0)

## 2024-06-02 ENCOUNTER — Ambulatory Visit: Payer: PPO | Admitting: Nurse Practitioner

## 2024-06-02 ENCOUNTER — Encounter: Payer: Self-pay | Admitting: Nurse Practitioner

## 2024-06-02 VITALS — BP 130/70 | HR 75 | Temp 97.0°F | Resp 16 | Ht 64.0 in | Wt 161.0 lb

## 2024-06-02 DIAGNOSIS — Z0000 Encounter for general adult medical examination without abnormal findings: Secondary | ICD-10-CM

## 2024-06-02 DIAGNOSIS — E2839 Other primary ovarian failure: Secondary | ICD-10-CM

## 2024-06-02 DIAGNOSIS — E038 Other specified hypothyroidism: Secondary | ICD-10-CM | POA: Diagnosis not present

## 2024-06-02 DIAGNOSIS — F411 Generalized anxiety disorder: Secondary | ICD-10-CM | POA: Diagnosis not present

## 2024-06-02 DIAGNOSIS — Z23 Encounter for immunization: Secondary | ICD-10-CM

## 2024-06-02 DIAGNOSIS — R7989 Other specified abnormal findings of blood chemistry: Secondary | ICD-10-CM | POA: Diagnosis not present

## 2024-06-02 DIAGNOSIS — I10 Essential (primary) hypertension: Secondary | ICD-10-CM | POA: Diagnosis not present

## 2024-06-02 DIAGNOSIS — E559 Vitamin D deficiency, unspecified: Secondary | ICD-10-CM | POA: Diagnosis not present

## 2024-06-02 DIAGNOSIS — I700 Atherosclerosis of aorta: Secondary | ICD-10-CM | POA: Diagnosis not present

## 2024-06-02 DIAGNOSIS — Z1231 Encounter for screening mammogram for malignant neoplasm of breast: Secondary | ICD-10-CM

## 2024-06-02 MED ORDER — ROSUVASTATIN CALCIUM 20 MG PO TABS: 20.0000 mg | ORAL_TABLET | Freq: Every day | ORAL | 1 refills | Status: AC

## 2024-06-02 MED ORDER — VITAMIN D (ERGOCALCIFEROL) 1.25 MG (50000 UNIT) PO CAPS: 50000.0000 [IU] | ORAL_CAPSULE | ORAL | 1 refills | Status: AC

## 2024-06-02 MED ORDER — ZOSTER VAC RECOMB ADJUVANTED 50 MCG/0.5ML IM SUSR: 0.5000 mL | Freq: Once | INTRAMUSCULAR | 1 refills | Status: AC | PRN

## 2024-06-02 MED ORDER — AMLODIPINE BESYLATE 2.5 MG PO TABS: 2.5000 mg | ORAL_TABLET | Freq: Every day | ORAL | 1 refills | Status: AC

## 2024-06-02 MED ORDER — LEVOTHYROXINE SODIUM 50 MCG PO TABS: ORAL_TABLET | ORAL | 1 refills | Status: AC

## 2024-06-02 MED ORDER — SERTRALINE HCL 50 MG PO TABS: 50.0000 mg | ORAL_TABLET | Freq: Every day | ORAL | 1 refills | Status: AC

## 2024-06-02 NOTE — Progress Notes (Signed)
 [facility] [address] [place], [place] [address]  Internal MEDICINE  Office Visit Note  Patient Name: Wendy Garza  [id number]  [id number]  Date of Service: 06/02/2024  Chief Complaint  Patient presents with   Depression   Hypertension   Hyperlipidemia   Medicare Wellness    HPI Wendy Garza presents for a medicare annual wellness visit.  Well-appearing 78 y.o. female with hypertension, asthma, hypothyroidism, hyperlipidemia, and GAD.  Routine CRC screening: due in 2029 Routine mammogram: due in November  DEXA scan: due now  Pap smear: discontinued, aged out  Labs: results discussed with patient today A1c is 6.0, prediabetic Low vitamin D  19.2 High triglycerides, low HDL Abnormal kidney function -- slightly high creatinine and slightly low eGFR. No prior abnormal kidney function labs.  Slightly elevated MCV New or worsening pain: none  Other concerns: none   Current specialists: Urology: Clotilda Cornwall PA-C Nephrology: Dr. Woodward Brought Ophthalmology: Dr. Elsie Carmine Orthopedic Surgery: Dr. Lynwood Hue, [facility].      06/02/2024    8:56 AM 05/30/2023   11:05 AM 05/24/2022   10:54 AM  MMSE - Mini Mental State Exam  Orientation to time 5 5 5   Orientation to Place 5 5 5   Registration 3 3 3   Attention/ Calculation 5 5 5   Recall 3 3 3   Language- name 2 objects 2 2 2   Language- repeat 1 1 1   Language- follow 3 step command 3 3 3   Language- read & follow direction 1 1 1   Write a sentence 1 1 1   Copy design 1 1 1   Total score 30 30 30     Functional Status Survey: Is the patient deaf or have difficulty hearing?: Yes Does the patient have difficulty seeing, even when wearing glasses/contacts?: No Does the patient have difficulty concentrating, remembering, or making decisions?: No Does the patient have difficulty walking or climbing stairs?: Yes Does the patient have difficulty dressing or bathing?: No Does the patient have difficulty  doing errands alone such as visiting a doctor's office or shopping?: No     07/03/2021    7:06 AM 12/14/2021   10:54 AM 05/24/2022   10:52 AM 05/30/2023   11:04 AM 06/02/2024    8:55 AM  Fall Risk  Falls in the past year?  0 0 0 0  Was there an injury with Fall?   0 0 0  Fall Risk Category Calculator   0 0 0  Fall Risk Category (Retired)   Low     (RETIRED) Patient Fall Risk Level Moderate fall risk  Low fall risk  Low fall risk     Patient at Risk for Falls Due to  No Fall Risks No Fall Risks No Fall Risks No Fall Risks  Fall risk Follow up  Falls evaluation completed  Falls evaluation completed  Falls evaluation completed Falls evaluation completed     Data saved with a previous flowsheet row definition       05/30/2023   11:04 AM  Depression screen PHQ 2/9  Decreased Interest 0  Down, Depressed, Hopeless 0  PHQ - 2 Score 0       Current Medication: Outpatient Encounter Medications as of 06/02/2024  Medication Sig   Vitamin D , Ergocalciferol , (DRISDOL ) 1.25 MG (50000 UNIT) CAPS capsule Take 1 capsule (50,000 Units total) by mouth every 7 (seven) days.   Zoster Vaccine Adjuvanted [facility]) injection Inject 0.5 mLs into the muscle once as needed for up to 1 dose (vaccination).  acetaminophen  (TYLENOL ) 500 MG tablet Take 1,000 mg by mouth every 6 (six) hours as needed.   albuterol  (VENTOLIN  HFA) 108 (90 Base) MCG/ACT inhaler Inhale 2 puffs into the lungs every 6 (six) hours as needed for wheezing or shortness of breath. Reported on 11/10/2015   amLODipine  (NORVASC ) 2.5 MG tablet Take 1 tablet (2.5 mg total) by mouth daily.   budeson-glycopyrrolate -formoterol  (BREZTRI  AEROSPHERE) 160-9-4.8 MCG/ACT AERO Inhale 2 puffs into the lungs in the morning and at bedtime.   levothyroxine  (SYNTHROID ) 50 MCG tablet TAKE 1 TABLET BY MOUTH EVERY DAY BEFORE BREAKFAST   metoprolol  tartrate (LOPRESSOR ) 50 MG tablet TAKE 1/2 TABLET TWICE A DAY BY MOUTH   rosuvastatin  (CRESTOR ) 20 MG tablet Take 1  tablet (20 mg total) by mouth daily.   sertraline  (ZOLOFT ) 50 MG tablet Take 1 tablet (50 mg total) by mouth daily.   [DISCONTINUED] amLODipine  (NORVASC ) 2.5 MG tablet TAKE 1 TABLET BY MOUTH EVERY DAY   [DISCONTINUED] levothyroxine  (SYNTHROID ) 50 MCG tablet TAKE 1 TABLET BY MOUTH EVERY DAY BEFORE BREAKFAST   [DISCONTINUED] rosuvastatin  (CRESTOR ) 20 MG tablet Take 1 tablet (20 mg total) by mouth daily.   [DISCONTINUED] sertraline  (ZOLOFT ) 50 MG tablet TAKE 1 TABLET BY MOUTH EVERY DAY   No facility-administered encounter medications on file as of 06/02/2024.    Surgical History: Past Surgical History:  Procedure Laterality Date   ABDOMINAL HYSTERECTOMY     BREAST SURGERY Left    BIOPSY   CATARACT EXTRACTION W/PHACO Left 10/04/2015   Procedure: CATARACT EXTRACTION PHACO AND INTRAOCULAR LENS PLACEMENT (IOC);  Surgeon: Elsie Carmine, MD;  Location: [facility] ORS;  Service: Ophthalmology;  Laterality: Left;  US : 00:46.8 AP%: 21.7 CDE: 10.19 Lot # [id number]   CATARACT EXTRACTION W/PHACO Right 10/25/2015   Procedure: CATARACT EXTRACTION PHACO AND INTRAOCULAR LENS PLACEMENT (IOC);  Surgeon: Elsie Carmine, MD;  Location: [facility] ORS;  Service: Ophthalmology;  Laterality: Right;  US  00:48 AP% 25.2 CDE 12.16 fluid pack lot # [id number]   COLONOSCOPY WITH PROPOFOL  N/A 05/28/2016   Procedure: COLONOSCOPY WITH PROPOFOL ;  Surgeon: Lamar ONEIDA Holmes, MD;  Location: [facility] ENDOSCOPY;  Service: Endoscopy;  Laterality: N/A;   COLONOSCOPY WITH PROPOFOL  N/A 07/03/2021   Procedure: COLONOSCOPY WITH PROPOFOL ;  Surgeon: Unk Corinn Skiff, MD;  Location: [facility] ENDOSCOPY;  Service: Gastroenterology;  Laterality: N/A;   CYSTOSCOPY WITH BIOPSY N/A 08/31/2019   Procedure: CYSTOSCOPY WITH bladder BIOPSY;  Surgeon: Penne Knee, MD;  Location: [facility] ORS;  Service: Urology;  Laterality: N/A;   CYSTOSCOPY WITH FULGERATION N/A 08/31/2019   Procedure: CYSTOSCOPY WITH FULGERATION;  Surgeon: Penne Knee, MD;  Location:  [facility] ORS;  Service: Urology;  Laterality: N/A;   KNEE ARTHROPLASTY Left 11/07/2022   Procedure: COMPUTER ASSISTED TOTAL KNEE ARTHROPLASTY;  Surgeon: Mardee Lynwood SQUIBB, MD;  Location: [facility] ORS;  Service: Orthopedics;  Laterality: Left;    Medical History: Past Medical History:  Diagnosis Date   Arthritis    Asthma    Cancer (HCC) 2003   UTERINE, total Hysterectomy   COPD (chronic obstructive pulmonary disease) (HCC)    Depression    Hyperlipidemia    Hypertension    Shortness of breath dyspnea    DOE    Family History: Family History  Problem Relation Age of Onset   Kidney disease Mother    Hypertension Sister    Hyperlipidemia Sister    Hyperlipidemia Brother    Hypertension Brother    Kidney cancer Neg Hx    Prostate cancer Neg Hx  Social History   Socioeconomic History   Marital status: Single    Spouse name: Not on file   Number of children: Not on file   Years of education: Not on file   Highest education level: Not on file  Occupational History   Not on file  Tobacco Use   Smoking status: Every Day    Current packs/day: 0.25    Average packs/day: 0.3 packs/day for 25.0 years (6.3 ttl pk-yrs)    Types: Cigarettes    Passive exposure: Current   Smokeless tobacco: Never   Tobacco comments:    3 or 4 cigarettes a day-reported on 10/29/22  Vaping Use   Vaping status: Never Used  Substance and Sexual Activity   Alcohol use: Yes    Comment: rarely   Drug use: No   Sexual activity: Not on file  Other Topics Concern   Not on file  Social History Narrative   Not on file   Social Drivers of Health   Financial Resource Strain: Low Risk  (05/10/2021)   Overall Financial Resource Strain (CARDIA)    Difficulty of Paying Living Expenses: Not very hard  Food Insecurity: No Food Insecurity (11/07/2022)   Hunger Vital Sign    Worried About Running Out of Food in the Last Year: Never true    Ran Out of Food in the Last Year: Never true  Transportation Needs: No  Transportation Needs (11/07/2022)   PRAPARE - Administrator, Civil Service (Medical): No    Lack of Transportation (Non-Medical): No  Physical Activity: Not on file  Stress: Not on file  Social Connections: Not on file  Intimate Partner Violence: Not At Risk (11/07/2022)   Humiliation, Afraid, Rape, and Kick questionnaire    Fear of Current or Ex-Partner: No    Emotionally Abused: No    Physically Abused: No    Sexually Abused: No      Review of Systems  Constitutional:  Negative for activity change, appetite change, chills, fatigue, fever and unexpected weight change.  HENT: Negative.  Negative for congestion, ear pain, rhinorrhea, sore throat and trouble swallowing.   Eyes: Negative.   Respiratory: Negative.  Negative for cough, chest tightness, shortness of breath and wheezing.   Cardiovascular: Negative.  Negative for chest pain.  Gastrointestinal: Negative.  Negative for abdominal pain, blood in stool, constipation, diarrhea, nausea and vomiting.  Endocrine: Negative.   Genitourinary: Negative.  Negative for difficulty urinating, dysuria, frequency, hematuria and urgency.  Musculoskeletal: Negative.  Negative for arthralgias, back pain, joint swelling, myalgias and neck pain.  Skin: Negative.  Negative for rash and wound.  Allergic/Immunologic: Negative.  Negative for immunocompromised state.  Neurological: Negative.  Negative for dizziness, seizures, numbness and headaches.  Hematological: Negative.   Psychiatric/Behavioral: Negative.  Negative for behavioral problems, self-injury and suicidal ideas. The patient is not nervous/anxious.     Vital Signs: BP 130/70   Pulse 75   Temp (!) 97 F (36.1 C)   Resp 16   Ht 5' 4 (1.626 m)   Wt 161 lb (73 kg)   LMP  (LMP Unknown)   SpO2 93%   BMI 27.64 kg/m    Physical Exam Vitals reviewed.  Constitutional:      General: She is awake. She is not in acute distress.    Appearance: Normal appearance. She is  well-developed, well-groomed and normal weight. She is not ill-appearing or diaphoretic.  HENT:     Head: Normocephalic and atraumatic.  Right Ear: Tympanic membrane, ear canal and external ear normal.     Left Ear: Tympanic membrane, ear canal and external ear normal.     Nose: Nose normal. No congestion or rhinorrhea.     Mouth/Throat:     Lips: Pink.     Mouth: Mucous membranes are moist.     Pharynx: Oropharynx is clear. Uvula midline. No oropharyngeal exudate or posterior oropharyngeal erythema.  Eyes:     General: Lids are normal. Vision grossly intact. Gaze aligned appropriately. No scleral icterus.       Right eye: No discharge.        Left eye: No discharge.     Extraocular Movements: Extraocular movements intact.     Conjunctiva/sclera: Conjunctivae normal.     Pupils: Pupils are equal, round, and reactive to light.     Funduscopic exam:    Right eye: Red reflex present.        Left eye: Red reflex present. Neck:     Thyroid : No thyromegaly.     Vascular: No carotid bruit or JVD.     Trachea: Trachea and phonation normal. No tracheal deviation.  Cardiovascular:     Rate and Rhythm: Normal rate and regular rhythm.     Pulses: Normal pulses.     Heart sounds: Normal heart sounds, S1 normal and S2 normal. No murmur heard.    No friction rub. No gallop.  Pulmonary:     Effort: Pulmonary effort is normal. No accessory muscle usage or respiratory distress.     Breath sounds: Normal breath sounds and air entry. No stridor. No wheezing or rales.  Chest:     Chest wall: No tenderness.     Comments: Declined clinical breast exam, gets yearly mammograms.  Abdominal:     General: Bowel sounds are normal. There is no distension.     Palpations: Abdomen is soft. There is no shifting dullness, fluid wave, mass or pulsatile mass.     Tenderness: There is no abdominal tenderness. There is no guarding or rebound.  Musculoskeletal:        General: No tenderness or deformity. Normal  range of motion.     Cervical back: Normal range of motion and neck supple.     Right lower leg: No edema.     Left lower leg: No edema.  Lymphadenopathy:     Cervical: No cervical adenopathy.  Skin:    General: Skin is warm and dry.     Capillary Refill: Capillary refill takes less than 2 seconds.     Coloration: Skin is not pale.     Findings: No erythema or rash.  Neurological:     Mental Status: She is alert and oriented to person, place, and time.     Cranial Nerves: No cranial nerve deficit.     Motor: No abnormal muscle tone.     Coordination: Coordination normal.     Gait: Gait normal.     Deep Tendon Reflexes: Reflexes are normal and symmetric.  Psychiatric:        Mood and Affect: Mood and affect normal.        Behavior: Behavior normal. Behavior is cooperative.        Thought Content: Thought content normal.        Judgment: Judgment normal.        Assessment/Plan: 1. Encounter for subsequent annual wellness visit (AWV) in Medicare patient (Primary) Age-appropriate preventive screenings and vaccinations discussed, annual physical exam completed. Routine labs for  health maintenance results discussed at her visit today. PHM updated.    2. Essential hypertension Stable, continue amlodipine  as prescribed.  - amLODipine  (NORVASC ) 2.5 MG tablet; Take 1 tablet (2.5 mg total) by mouth daily.  Dispense: 90 tablet; Refill: 1  3. Atherosclerosis of aorta Continue rosuvastatin  as prescribed  - rosuvastatin  (CRESTOR ) 20 MG tablet; Take 1 tablet (20 mg total) by mouth daily.  Dispense: 90 tablet; Refill: 1  4. Elevated serum creatinine Noted, will repeat labs in a few months   5. Subclinical hypothyroidism Continue levothyroxine  as prescribed  - levothyroxine  (SYNTHROID ) 50 MCG tablet; TAKE 1 TABLET BY MOUTH EVERY DAY BEFORE BREAKFAST  Dispense: 90 tablet; Refill: 1  6. Vitamin D  deficiency Continue vitamin D  as prescribed  - Vitamin D , Ergocalciferol , (DRISDOL ) 1.25 MG  (50000 UNIT) CAPS capsule; Take 1 capsule (50,000 Units total) by mouth every 7 (seven) days.  Dispense: 12 capsule; Refill: 1  7. Generalized anxiety disorder Continue sertraline  as prescribed  - sertraline  (ZOLOFT ) 50 MG tablet; Take 1 tablet (50 mg total) by mouth daily.  Dispense: 90 tablet; Refill: 1  8. Ovarian failure due to menopause Dexa scan ordered  - DG Bone Density; Future  9. Encounter for screening mammogram for malignant neoplasm of breast Routine mammogram ordered  - MM 3D SCREENING MAMMOGRAM BILATERAL BREAST; Future  10. Need for vaccination Flu vaccine administered today in office. Shingles vaccine ordered  - Influenza, MDCK, trivalent, PF(Flucelvax egg-free) - Zoster Vaccine Adjuvanted [facility]) injection; Inject 0.5 mLs into the muscle once as needed for up to 1 dose (vaccination).  Dispense: 0.5 mL; Refill: 1     General Counseling: karsten vaughn understanding of the findings of todays visit and agrees with plan of treatment. I have discussed any further diagnostic evaluation that may be needed or ordered today. We also reviewed her medications today. she has been encouraged to call the office with any questions or concerns that should arise related to todays visit.    Orders Placed This Encounter  Procedures   DG Bone Density   MM 3D SCREENING MAMMOGRAM BILATERAL BREAST   Influenza, MDCK, trivalent, PF(Flucelvax egg-free)    Meds ordered this encounter  Medications   Zoster Vaccine Adjuvanted [facility]) injection    Sig: Inject 0.5 mLs into the muscle once as needed for up to 1 dose (vaccination).    Dispense:  0.5 mL    Refill:  1    Due for 2 dose shingles vaccine   sertraline  (ZOLOFT ) 50 MG tablet    Sig: Take 1 tablet (50 mg total) by mouth daily.    Dispense:  90 tablet    Refill:  1   rosuvastatin  (CRESTOR ) 20 MG tablet    Sig: Take 1 tablet (20 mg total) by mouth daily.    Dispense:  90 tablet    Refill:  1   levothyroxine  (SYNTHROID )  50 MCG tablet    Sig: TAKE 1 TABLET BY MOUTH EVERY DAY BEFORE BREAKFAST    Dispense:  90 tablet    Refill:  1   amLODipine  (NORVASC ) 2.5 MG tablet    Sig: Take 1 tablet (2.5 mg total) by mouth daily.    Dispense:  90 tablet    Refill:  1   Vitamin D , Ergocalciferol , (DRISDOL ) 1.25 MG (50000 UNIT) CAPS capsule    Sig: Take 1 capsule (50,000 Units total) by mouth every 7 (seven) days.    Dispense:  12 capsule    Refill:  1  Fill new script today    Return in about 6 months (around 12/01/2024) for F/U, Keenan Trefry PCP.   Total time spent:30 Minutes Time spent includes review of chart, medications, test results, and follow up plan with the patient.   [place] Controlled Substance Database was reviewed by me.  This patient was seen by Mardy Maxin, FNP-C in collaboration with Dr. Sigrid Bathe as a part of collaborative care agreement.  Ariela Mochizuki R. Maxin, MSN, FNP-C Internal medicine

## 2024-06-17 ENCOUNTER — Encounter: Payer: Self-pay | Admitting: Nurse Practitioner

## 2024-06-23 NOTE — Progress Notes (Signed)
   06/23/2024  Patient ID: Wendy Garza, female   DOB: 04-27-1946, 78 y.o.   MRN: [id number]  Pharmacy Quality Measure Review  This patient is appearing on a report for being at risk of failing the adherence measure for cholesterol (statin) medications this calendar year.   Medication: Rosuvastatin  Last fill date: 06/12/24 for 90 day supply  Insurance report was not up to date. No action needed at this time.   Jon VEAR Lindau, PharmD Clinical Pharmacist [phone]

## 2024-07-17 DIAGNOSIS — Z1231 Encounter for screening mammogram for malignant neoplasm of breast: Secondary | ICD-10-CM | POA: Diagnosis not present

## 2024-09-09 ENCOUNTER — Other Ambulatory Visit: Payer: Self-pay | Admitting: Nurse Practitioner

## 2024-09-09 DIAGNOSIS — I10 Essential (primary) hypertension: Secondary | ICD-10-CM

## 2024-12-02 ENCOUNTER — Ambulatory Visit: Admitting: Nurse Practitioner

## 2025-02-04 ENCOUNTER — Ambulatory Visit: Admitting: Urology

## 2025-06-03 ENCOUNTER — Ambulatory Visit: Admitting: Nurse Practitioner
